# Patient Record
Sex: Female | Born: 1955 | Race: Black or African American | Hispanic: No | Marital: Single | Smoking: Never smoker
Health system: Southern US, Community
[De-identification: ages and names within clinical notes are randomized; demographics above are authoritative.]

## PROBLEM LIST (undated history)

## (undated) DIAGNOSIS — D649 Anemia, unspecified: Secondary | ICD-10-CM

## (undated) DIAGNOSIS — M19049 Primary osteoarthritis, unspecified hand: Secondary | ICD-10-CM

## (undated) DIAGNOSIS — E119 Type 2 diabetes mellitus without complications: Secondary | ICD-10-CM

## (undated) DIAGNOSIS — L570 Actinic keratosis: Secondary | ICD-10-CM

## (undated) DIAGNOSIS — Z5189 Encounter for other specified aftercare: Secondary | ICD-10-CM

## (undated) DIAGNOSIS — E785 Hyperlipidemia, unspecified: Secondary | ICD-10-CM

## (undated) DIAGNOSIS — I10 Essential (primary) hypertension: Secondary | ICD-10-CM

## (undated) DIAGNOSIS — J45909 Unspecified asthma, uncomplicated: Secondary | ICD-10-CM

## (undated) HISTORY — DX: Encounter for other specified aftercare: Z51.89

## (undated) HISTORY — DX: Unspecified asthma, uncomplicated: J45.909

## (undated) HISTORY — DX: Primary osteoarthritis, unspecified hand: M19.049

## (undated) HISTORY — DX: Anemia, unspecified: D64.9

## (undated) HISTORY — DX: Actinic keratosis: L57.0

## (undated) HISTORY — DX: Hyperlipidemia, unspecified: E78.5

---

## 1998-07-02 ENCOUNTER — Emergency Department (HOSPITAL_COMMUNITY): Admission: EM | Admit: 1998-07-02 | Discharge: 1998-07-02 | Payer: Self-pay | Admitting: Emergency Medicine

## 1998-09-26 ENCOUNTER — Encounter: Payer: Self-pay | Admitting: Emergency Medicine

## 1998-09-26 ENCOUNTER — Emergency Department (HOSPITAL_COMMUNITY): Admission: EM | Admit: 1998-09-26 | Discharge: 1998-09-26 | Payer: Self-pay | Admitting: Emergency Medicine

## 1999-06-14 ENCOUNTER — Emergency Department (HOSPITAL_COMMUNITY): Admission: EM | Admit: 1999-06-14 | Discharge: 1999-06-14 | Payer: Self-pay | Admitting: Emergency Medicine

## 1999-09-29 ENCOUNTER — Other Ambulatory Visit: Admission: RE | Admit: 1999-09-29 | Discharge: 1999-09-29 | Payer: Self-pay | Admitting: Family Medicine

## 2000-01-24 ENCOUNTER — Encounter (INDEPENDENT_AMBULATORY_CARE_PROVIDER_SITE_OTHER): Payer: Self-pay | Admitting: Specialist

## 2000-01-24 ENCOUNTER — Other Ambulatory Visit: Admission: RE | Admit: 2000-01-24 | Discharge: 2000-01-24 | Payer: Self-pay | Admitting: Obstetrics and Gynecology

## 2000-09-06 ENCOUNTER — Encounter: Payer: Self-pay | Admitting: Family Medicine

## 2000-09-06 ENCOUNTER — Encounter: Admission: RE | Admit: 2000-09-06 | Discharge: 2000-09-06 | Payer: Self-pay | Admitting: Family Medicine

## 2000-09-18 ENCOUNTER — Other Ambulatory Visit: Admission: RE | Admit: 2000-09-18 | Discharge: 2000-09-18 | Payer: Self-pay | Admitting: Family Medicine

## 2001-09-20 ENCOUNTER — Emergency Department (HOSPITAL_COMMUNITY): Admission: EM | Admit: 2001-09-20 | Discharge: 2001-09-20 | Payer: Self-pay

## 2001-12-11 ENCOUNTER — Other Ambulatory Visit: Admission: RE | Admit: 2001-12-11 | Discharge: 2001-12-11 | Payer: Self-pay | Admitting: Family Medicine

## 2003-04-21 ENCOUNTER — Other Ambulatory Visit: Admission: RE | Admit: 2003-04-21 | Discharge: 2003-04-21 | Payer: Self-pay | Admitting: Family Medicine

## 2003-07-31 ENCOUNTER — Encounter: Admission: RE | Admit: 2003-07-31 | Discharge: 2003-07-31 | Payer: Self-pay | Admitting: Family Medicine

## 2003-07-31 IMAGING — US US PELVIS COMPLETE MODIFY
1 series · 14 of 25 positions shown · non-contrast
Comparison: none

CLINICAL DATA: Dysmenorrhea.  
 TRANSABDOMINAL AND TRANSVAGINAL PELVIC ULTRASOUND
 The uterus measures 10.4 x 6.5 x 7.9 cm in size and has a somewhat heterogeneous ultrasound texture.  There is a 1.6 x 1.0 x 1.6 cm in size fibroid seen within the posterior body of the uterus and a .6 x .5 x .7 cm in size fibroid seen within the right fundal portion of the uterus.  The endometrial stripe thickness measures 16 mm.  The ovaries are normal in size and contour, and there is no evidence for a pathologic cyst or mass.  There is a small amount of free pelvic fluid. 
 IMPRESSION
 1.  Small (1.6 cm) posterior body uterine fibroid and 7 mm in size right fundal uterine fibroid.  
 2.  The endometrial stripe thickness is 16 mm.  
 3.  Normal adnexal regions.

[Series 1: unknown · 0.27mm/px · 14 of 50 slices shown]
[im 1/50]
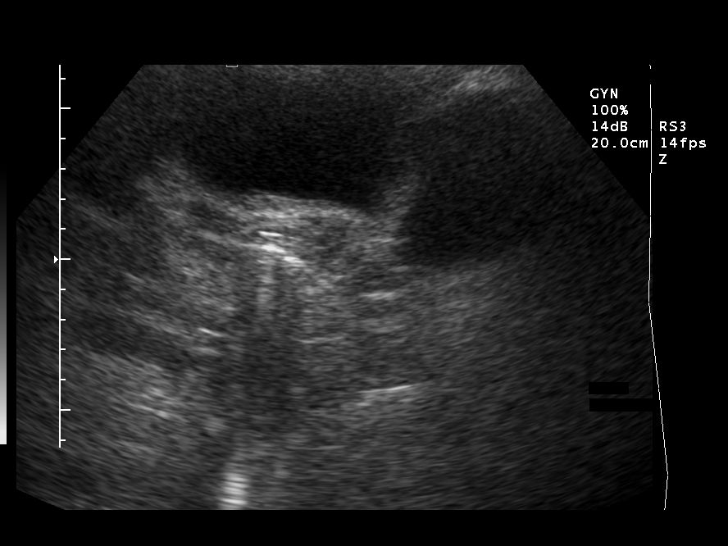
[im 5/50]
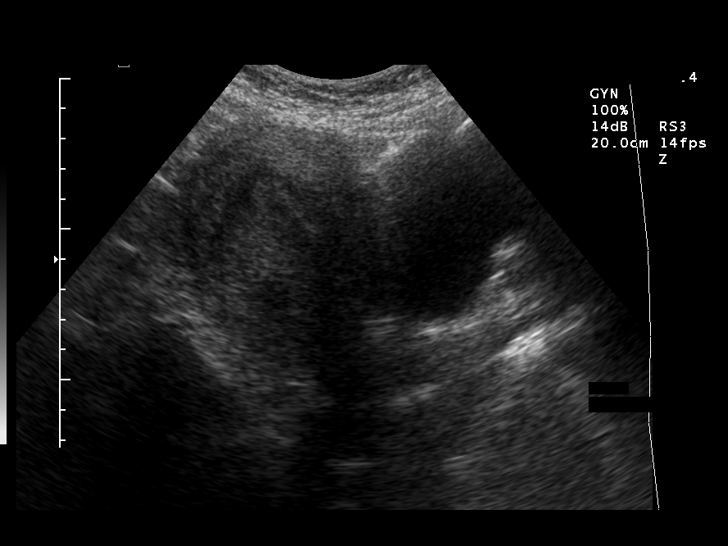
[im 9/50]
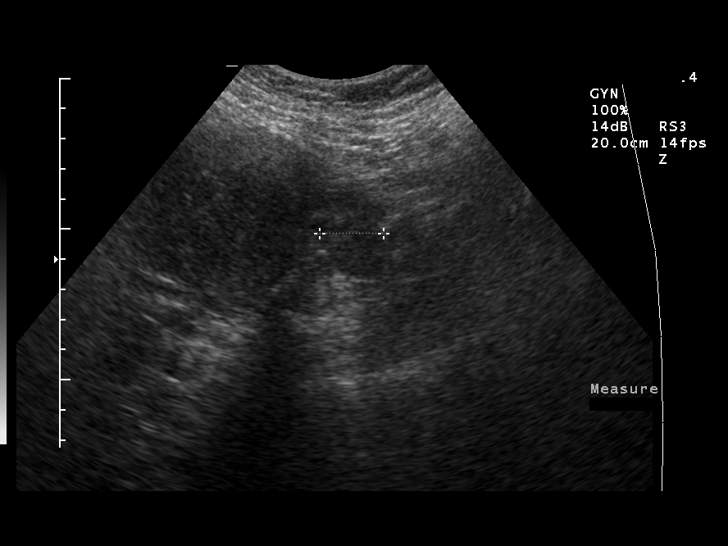
[im 13/50]
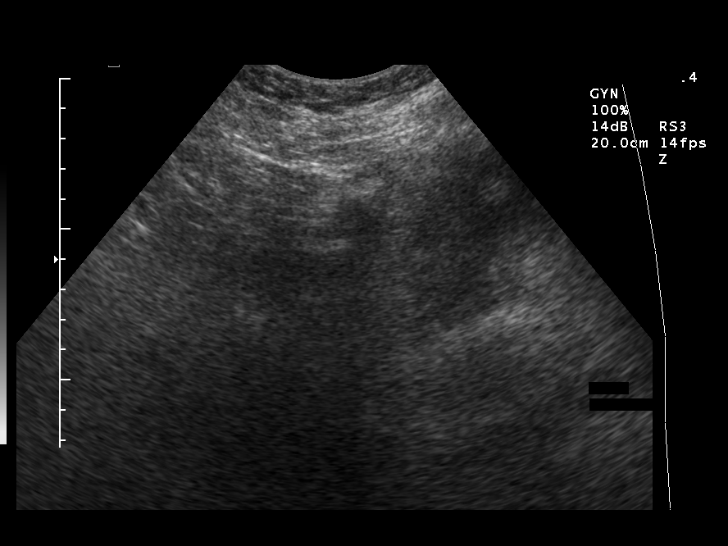
[im 17/50]
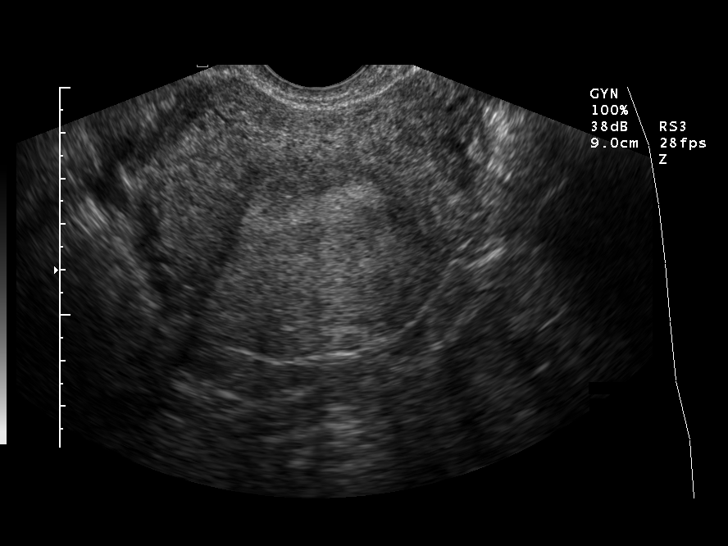
[im 19/50]
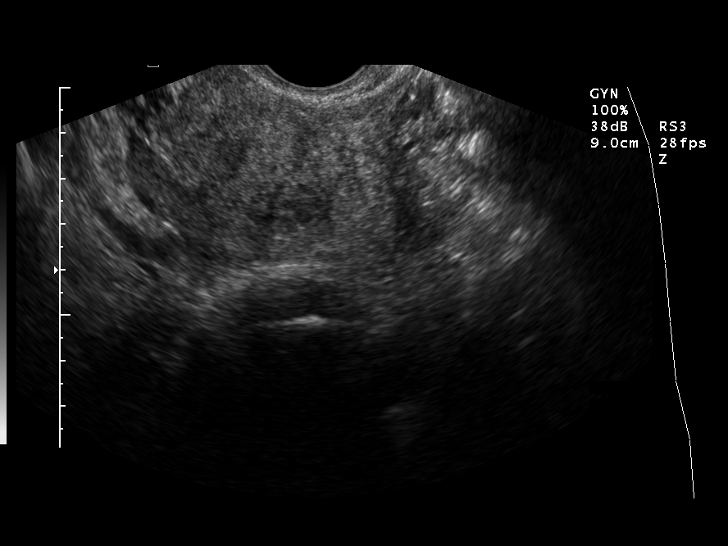
[im 23/50]
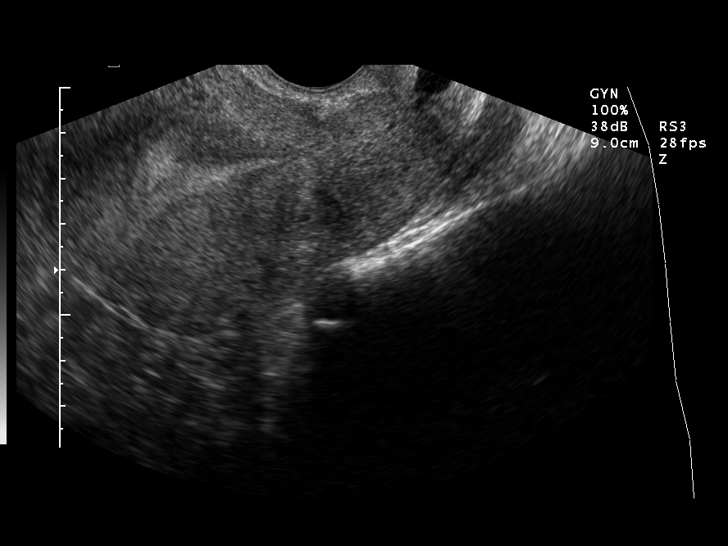
[im 27/50]
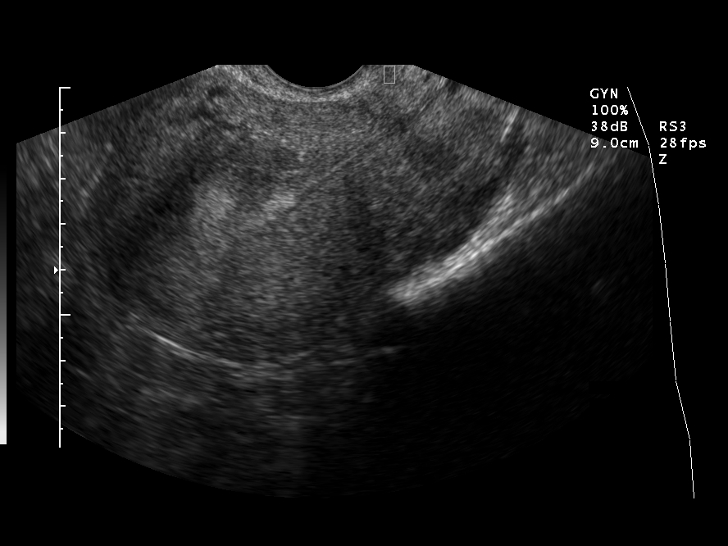
[im 31/50]
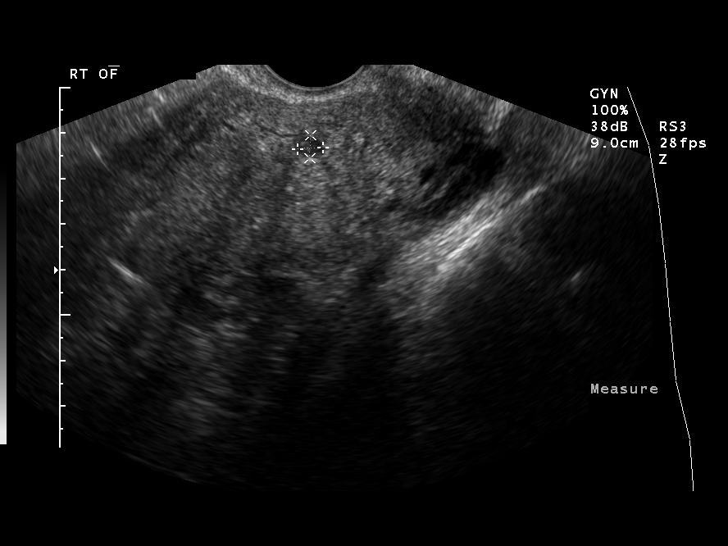
[im 33/50]
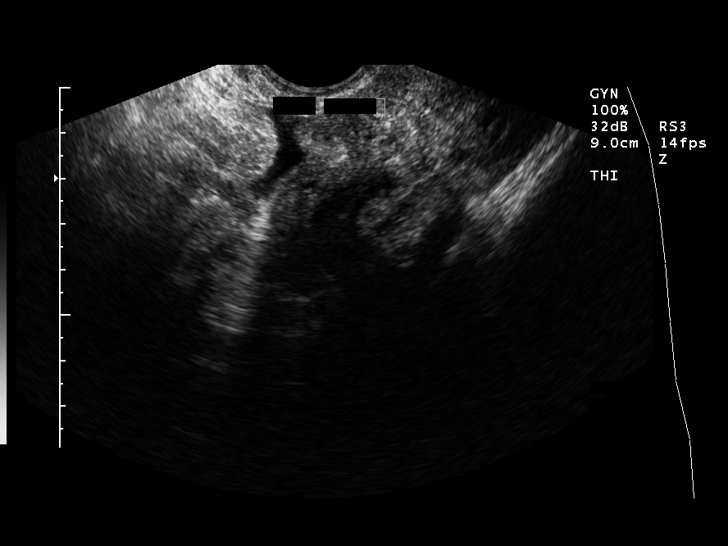
[im 37/50]
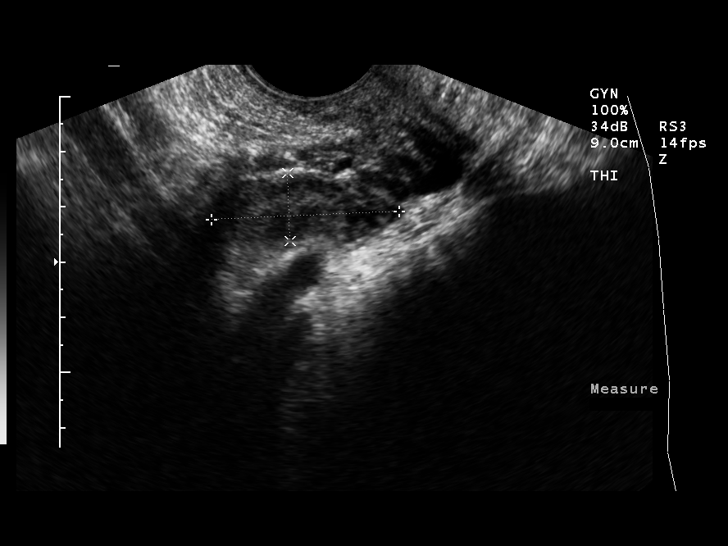
[im 41/50]
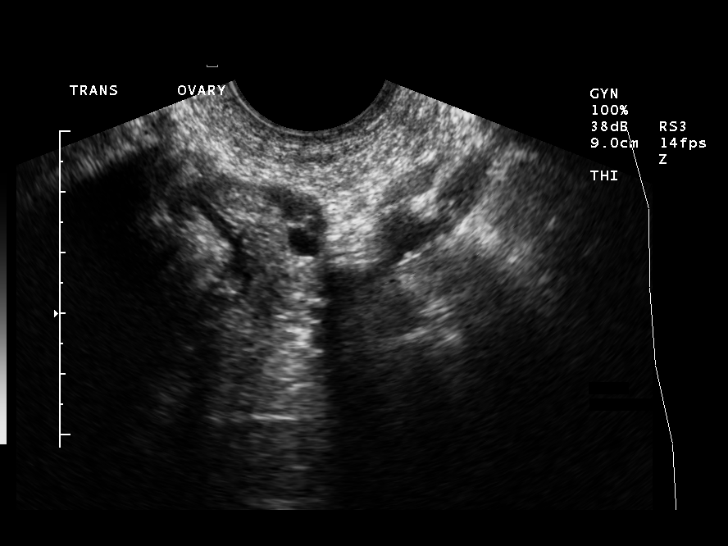
[im 45/50]
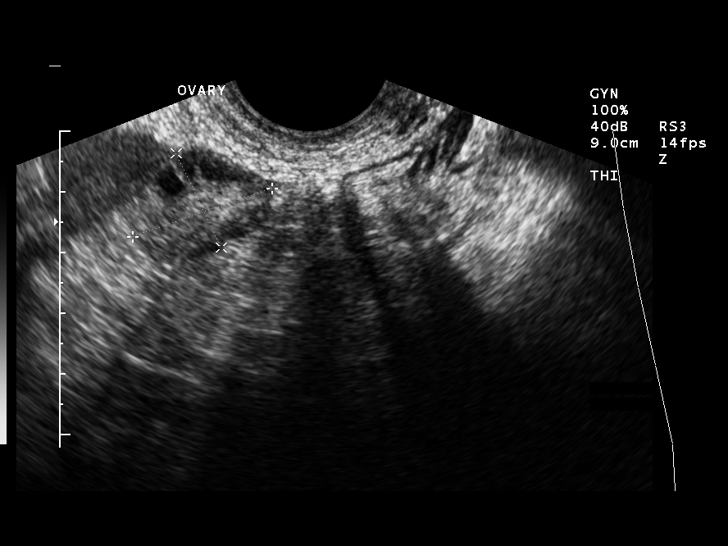
[im 50/50]
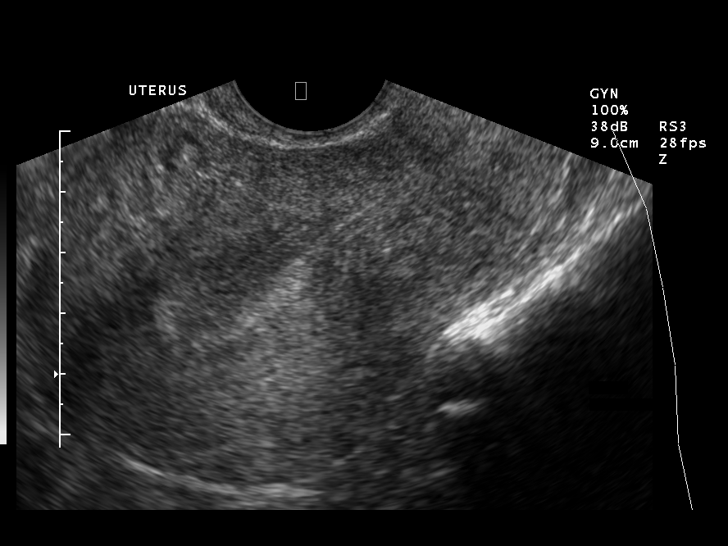

[14 of 25 positions shown; findings below may reference images not displayed]

## 2003-11-12 ENCOUNTER — Encounter: Admission: RE | Admit: 2003-11-12 | Discharge: 2003-11-12 | Payer: Self-pay | Admitting: Family Medicine

## 2005-02-01 ENCOUNTER — Emergency Department (HOSPITAL_COMMUNITY): Admission: EM | Admit: 2005-02-01 | Discharge: 2005-02-01 | Payer: Self-pay | Admitting: Emergency Medicine

## 2005-11-28 ENCOUNTER — Ambulatory Visit: Payer: Self-pay | Admitting: Family Medicine

## 2006-02-13 ENCOUNTER — Encounter: Payer: Self-pay | Admitting: Family Medicine

## 2006-02-13 ENCOUNTER — Other Ambulatory Visit: Admission: RE | Admit: 2006-02-13 | Discharge: 2006-02-13 | Payer: Self-pay | Admitting: Family Medicine

## 2006-02-13 ENCOUNTER — Ambulatory Visit: Payer: Self-pay | Admitting: Family Medicine

## 2008-06-06 ENCOUNTER — Other Ambulatory Visit: Admission: RE | Admit: 2008-06-06 | Discharge: 2008-06-06 | Payer: Self-pay | Admitting: Family Medicine

## 2008-06-06 ENCOUNTER — Ambulatory Visit: Payer: Self-pay | Admitting: Family Medicine

## 2008-06-06 ENCOUNTER — Encounter: Payer: Self-pay | Admitting: Family Medicine

## 2008-06-06 DIAGNOSIS — M19049 Primary osteoarthritis, unspecified hand: Secondary | ICD-10-CM

## 2008-06-06 DIAGNOSIS — D259 Leiomyoma of uterus, unspecified: Secondary | ICD-10-CM | POA: Insufficient documentation

## 2008-06-06 HISTORY — DX: Primary osteoarthritis, unspecified hand: M19.049

## 2008-06-06 LAB — CONVERTED CEMR LAB
Bilirubin Urine: NEGATIVE
Glucose, Urine, Semiquant: NEGATIVE
Nitrite: NEGATIVE
Protein, U semiquant: NEGATIVE
WBC Urine, dipstick: NEGATIVE

## 2008-07-04 ENCOUNTER — Ambulatory Visit: Payer: Self-pay | Admitting: Family Medicine

## 2008-08-01 ENCOUNTER — Ambulatory Visit (HOSPITAL_COMMUNITY): Admission: RE | Admit: 2008-08-01 | Discharge: 2008-08-01 | Payer: Self-pay | Admitting: Family Medicine

## 2008-12-03 ENCOUNTER — Ambulatory Visit: Payer: Self-pay | Admitting: Internal Medicine

## 2008-12-03 ENCOUNTER — Encounter (INDEPENDENT_AMBULATORY_CARE_PROVIDER_SITE_OTHER): Payer: Self-pay | Admitting: *Deleted

## 2008-12-03 DIAGNOSIS — I10 Essential (primary) hypertension: Secondary | ICD-10-CM

## 2008-12-30 ENCOUNTER — Encounter (INDEPENDENT_AMBULATORY_CARE_PROVIDER_SITE_OTHER): Payer: Self-pay | Admitting: Internal Medicine

## 2008-12-30 ENCOUNTER — Ambulatory Visit: Payer: Self-pay | Admitting: Internal Medicine

## 2011-08-09 ENCOUNTER — Other Ambulatory Visit: Payer: Self-pay | Admitting: Family Medicine

## 2011-08-09 DIAGNOSIS — Z1231 Encounter for screening mammogram for malignant neoplasm of breast: Secondary | ICD-10-CM

## 2011-08-29 ENCOUNTER — Encounter: Payer: Self-pay | Admitting: Family Medicine

## 2011-08-29 ENCOUNTER — Ambulatory Visit (INDEPENDENT_AMBULATORY_CARE_PROVIDER_SITE_OTHER): Payer: Self-pay | Admitting: Family Medicine

## 2011-08-29 ENCOUNTER — Other Ambulatory Visit (HOSPITAL_COMMUNITY)
Admission: RE | Admit: 2011-08-29 | Discharge: 2011-08-29 | Disposition: A | Discharge status: Home / Self Care | Payer: Self-pay | Source: Ambulatory Visit | Attending: Family Medicine | Admitting: Family Medicine

## 2011-08-29 DIAGNOSIS — Z01419 Encounter for gynecological examination (general) (routine) without abnormal findings: Secondary | ICD-10-CM | POA: Insufficient documentation

## 2011-08-29 DIAGNOSIS — M19049 Primary osteoarthritis, unspecified hand: Secondary | ICD-10-CM

## 2011-08-29 DIAGNOSIS — Z0000 Encounter for general adult medical examination without abnormal findings: Secondary | ICD-10-CM

## 2011-08-29 DIAGNOSIS — Z23 Encounter for immunization: Secondary | ICD-10-CM

## 2011-08-29 DIAGNOSIS — N920 Excessive and frequent menstruation with regular cycle: Secondary | ICD-10-CM

## 2011-08-29 DIAGNOSIS — D259 Leiomyoma of uterus, unspecified: Secondary | ICD-10-CM

## 2011-08-29 DIAGNOSIS — I10 Essential (primary) hypertension: Secondary | ICD-10-CM

## 2011-08-29 NOTE — Progress Notes (Signed)
  Subjective:    Patient ID: Hayley Fox, female    DOB: 09-21-1955, 56 y.o.   MRN: [id number]  HPI Lillybeth is a 56 year old female G6 P4 ,,,,,,,, still having regular monthly periods however she is experiencing hot flashes recently,,,,,,,, who comes in as a new patient for general physical examination  Other than for children she's always been in good health. She has noticed some soreness and stiffness in the joints of her hands and knees no redness or swelling. She gets routine eye care, dental care, tetanus booster unknown, never had a colonoscopy  Mammogram 3 years ago she does do BSE monthly. History of fibrocystic breast changes  Family history father is in his 57s with hypertension and diabetes mom died in her 55s complications of alcoholism one brother is an alcoholic no sisters.  Social history she is a Agricultural engineer by trade single    Review of Systems  Constitutional: Negative.   HENT: Negative.   Eyes: Negative.   Respiratory: Negative.   Cardiovascular: Negative.   Gastrointestinal: Negative.   Genitourinary: Negative.   Musculoskeletal: Positive for arthralgias.  Neurological: Negative.   Hematological: Negative.   Psychiatric/Behavioral: Negative.        Objective:   Physical Exam  Constitutional: She appears well-developed and well-nourished.  HENT:  Head: Normocephalic and atraumatic.  Right Ear: External ear normal.  Left Ear: External ear normal.  Nose: Nose normal.  Mouth/Throat: Oropharynx is clear and moist.  Eyes: EOM are normal. Pupils are equal, round, and reactive to light.  Neck: Normal range of motion. Neck supple. No thyromegaly present.  Cardiovascular: Normal rate, regular rhythm, normal heart sounds and intact distal pulses.  Exam reveals no gallop and no friction rub.   No murmur heard. Pulmonary/Chest: Effort normal and breath sounds normal.  Abdominal: Soft. Bowel sounds are normal. She exhibits no distension and no mass. There is no  tenderness. There is no rebound.  Genitourinary: Vagina normal and uterus normal. Guaiac negative stool. No vaginal discharge found.       Bilateral breast exam normal except for marble size soft rubbery movable cystic lesion left breast 3:00 1 inch from the nipple  Musculoskeletal: Normal range of motion.  Lymphadenopathy:    She has no cervical adenopathy.  Neurological: She is alert. She has normal reflexes. No cranial nerve deficit. She exhibits normal muscle tone. Coordination normal.  Skin: Skin is warm and dry.  Psychiatric: She has a normal mood and affect. Her behavior is normal. Judgment and thought content normal.          Assessment & Plan:  Healthy female  Lesion left breast 3:00 one at in the nipple soft rubbery movable probable cystic lesion mammogram next week for confirmation  Set up for a screening colonoscopy

## 2011-08-29 NOTE — Patient Instructions (Signed)
Continue your good health habits  Return in one year for general physical examination sooner if any problems 

## 2011-08-30 ENCOUNTER — Encounter: Payer: Self-pay | Admitting: Internal Medicine

## 2011-09-05 ENCOUNTER — Ambulatory Visit (HOSPITAL_COMMUNITY)
Admission: RE | Admit: 2011-09-05 | Discharge: 2011-09-05 | Disposition: A | Discharge status: Home / Self Care | Payer: Self-pay | Source: Ambulatory Visit | Attending: Family Medicine | Admitting: Family Medicine

## 2011-09-05 DIAGNOSIS — Z1231 Encounter for screening mammogram for malignant neoplasm of breast: Secondary | ICD-10-CM

## 2011-09-07 ENCOUNTER — Other Ambulatory Visit: Payer: Self-pay | Admitting: Family Medicine

## 2011-09-07 DIAGNOSIS — R928 Other abnormal and inconclusive findings on diagnostic imaging of breast: Secondary | ICD-10-CM

## 2011-09-27 ENCOUNTER — Other Ambulatory Visit: Payer: Self-pay | Admitting: Obstetrics and Gynecology

## 2011-09-27 ENCOUNTER — Ambulatory Visit (INDEPENDENT_AMBULATORY_CARE_PROVIDER_SITE_OTHER): Payer: Self-pay | Admitting: *Deleted

## 2011-09-27 VITALS — BP 157/99 | HR 81 | Temp 99.2°F | Ht 64.0 in | Wt 169.3 lb

## 2011-09-27 DIAGNOSIS — R928 Other abnormal and inconclusive findings on diagnostic imaging of breast: Secondary | ICD-10-CM

## 2011-09-27 DIAGNOSIS — N63 Unspecified lump in unspecified breast: Secondary | ICD-10-CM

## 2011-09-27 DIAGNOSIS — N6320 Unspecified lump in the left breast, unspecified quadrant: Secondary | ICD-10-CM

## 2011-09-27 DIAGNOSIS — Z1239 Encounter for other screening for malignant neoplasm of breast: Secondary | ICD-10-CM

## 2011-09-27 NOTE — Patient Instructions (Signed)
Taught patient how to perform BSE and gave educational materials to take home. Patient did not need a Pap smear today due to last Pap smear was 08/29/11.  Let her know BCCCP will cover Pap smears every 3 years unless has a history of abnormal Pap smears. Patient is scheduled for a left breast diagnostic mammogram and possible left breast ultrasound Friday, September 30, 2011 at 0950. Patient aware of appointment and will be there. Let patient know will follow up with her within the next couple weeks with results. Patients BP was elevated at 157/97. Talked with patient about her BP. Gave patient a sheet to use as a log to keep up with her BP. Encouraged her to have her BP taken several times and keep record. Talked with patient about increasing physical activity and diet. Encouraged patient to share the BP log with her PCP. Patient verbalized understanding.

## 2011-09-27 NOTE — Progress Notes (Signed)
Complaints of lump in left breast. Referred from the [facility] for additional imaging of left breast. Screening mammogram was 09/05/11.  Pap Smear:    Pap smear not performed today. Patients last Pap smear was 08/29/11 at [facility] and was normal. Per patient she has no history of abnormal Pap smears. Pap smear result above is in EPIC.  Physical exam: Breasts Left breast larger than right breast. No skin abnormalities bilateral breasts. No nipple retraction left breast. Right nipple inverted per patient is normal. No nipple discharge bilateral breasts. No lymphadenopathy. No lumps palpated right breast. Palpated lump in the left breast at 3 o'clock around 4 cm from the areola. No complaints of pain or tenderness on exam. Patient referred to the [facility] for left breast diagnostic mammogram and possible left breast ultrasound. Appointment scheduled for Friday, September 30, 2011 at 0950.          Pelvic/Bimanual No Pap smear completed today since last Pap smear was 08/29/11 and normal. Pap smear not indicated per BCCCP guidelines.

## 2011-09-30 ENCOUNTER — Other Ambulatory Visit: Payer: Self-pay

## 2011-10-03 ENCOUNTER — Ambulatory Visit
Admission: RE | Admit: 2011-10-03 | Discharge: 2011-10-03 | Disposition: A | Discharge status: Home / Self Care | Payer: No Typology Code available for payment source | Source: Ambulatory Visit | Attending: Obstetrics and Gynecology | Admitting: Obstetrics and Gynecology

## 2011-10-03 DIAGNOSIS — R928 Other abnormal and inconclusive findings on diagnostic imaging of breast: Secondary | ICD-10-CM

## 2011-10-04 ENCOUNTER — Telehealth: Payer: Self-pay

## 2011-10-04 NOTE — Telephone Encounter (Signed)
Called pt and was unable to leave message at both contact #'s [phone] and [phone] due to numbers being disconnected.  Will notify Wynona Canes to send certified letter.

## 2011-10-10 ENCOUNTER — Encounter: Payer: Self-pay | Admitting: Obstetrics and Gynecology

## 2011-10-10 ENCOUNTER — Encounter: Payer: Self-pay | Admitting: Internal Medicine

## 2011-11-08 ENCOUNTER — Telehealth: Payer: Self-pay

## 2011-11-08 NOTE — Telephone Encounter (Signed)
Patient is aware and will try the Motrin

## 2011-11-08 NOTE — Telephone Encounter (Signed)
Hayley Fox please call,,,,,,,,,,,,, date OTC remedy would be to take about 600 mg of Motrin twice daily with food,,,,,,,, or if that does not help or she thinks it may be something else we will need to see her in the office for evaluation

## 2011-11-08 NOTE — Telephone Encounter (Signed)
Triage VM:  Pt called and states she is having problems with arthritis and stiffness in her joints.  Pt states it is difficult to bathe herself.  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  Pt states she is experiencing pain along with the stiffness and would like to know if an rx can be called in to her pharmacy.  Pt states she is still paying on her bill and cannot come into the office for another visit.  Pls advise.

## 2012-03-15 ENCOUNTER — Ambulatory Visit (INDEPENDENT_AMBULATORY_CARE_PROVIDER_SITE_OTHER): Payer: Self-pay | Admitting: Family Medicine

## 2012-03-15 ENCOUNTER — Encounter: Payer: Self-pay | Admitting: Family Medicine

## 2012-03-15 DIAGNOSIS — L570 Actinic keratosis: Secondary | ICD-10-CM

## 2012-03-15 DIAGNOSIS — M1990 Unspecified osteoarthritis, unspecified site: Secondary | ICD-10-CM

## 2012-03-15 HISTORY — DX: Actinic keratosis: L57.0

## 2012-03-15 MED ORDER — INDOMETHACIN ER 75 MG PO CPCR: ORAL_CAPSULE | ORAL | Status: DC

## 2012-03-15 NOTE — Progress Notes (Signed)
  Subjective:    Patient ID: Hayley Fox, female    DOB: 05/07/1956, 56 y.o.   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 The base was cauterized Band-Aids was applied the lesion was sent for pathologic analysis. She tolerated the procedure no complications  She's also complaining at of multiple arthritic changes stiffness soreness unresponsive to [facility] Motrin 800 mg twice daily   Review of Systems General and dermatologic and neurologic review of systems otherwise negative    Objective:   Physical Exam Procedure see above       Assessment & Plan:

## 2012-03-15 NOTE — Patient Instructions (Signed)
Within 2 weeks we will call you the report on the lesion we removed it  Indomethacin one daily with food for your arthritis

## 2012-06-07 ENCOUNTER — Telehealth: Payer: Self-pay | Admitting: Family Medicine

## 2012-06-07 NOTE — Telephone Encounter (Signed)
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  She states she has seen Dr.  Tawanna Cooler for Bilateral Arm pain .  She was diagnosed with OsteoArthritis.  She reports the pain is worse and making work difficulty.  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  Difficulty with bathing and dressing her self.  Certain ADL's are aggravating pain . She reports Dr. Tawanna Cooler started her on Indocin and nothing agrees with her stomach , not even Motrin.  Treatment- Epsom salts, decreased beef products, hand arm exercise to strength but very painful. She reports certain activities worsen the discomfort.  She is wanting to get signed up for disability.  What does she need to do to start this process?  She does not want to get fired from her current position. PLEASE CONTACT CONCERNING DISABILITY

## 2012-06-07 NOTE — Telephone Encounter (Signed)
Hayley Fox please call. She needs to see an orthopedist for evaluation recommend Dr. Norlene Campbell and group

## 2012-06-21 NOTE — Telephone Encounter (Signed)
Patient calling back.  States that she has not heard anything about her request for disability issues.  Still unable to work more then 4 hours 2 days a week with the pain that she is having.  Per the note below, I gave her Dr. Nelida Meuse recommendation to see Dr. Cleophas Dunker.  I gave her the office location on [address] with Delbert Harness and the office phone number.  She is going to call for an appt.

## 2012-06-21 NOTE — Telephone Encounter (Signed)
noted 

## 2013-04-17 ENCOUNTER — Telehealth: Payer: Self-pay | Admitting: Family Medicine

## 2013-04-17 NOTE — Telephone Encounter (Signed)
Please call patient and offer an appointment tomorrow

## 2013-04-17 NOTE — Telephone Encounter (Signed)
Pt has had ongoing productive cough for a month, started w/ a bad cold. Pt been taking OTC meds.not helping.  Pt concerned it may be headed toward pneumonia. Pt would like appt asap. pls advise. None available.

## 2013-04-18 ENCOUNTER — Encounter: Payer: Self-pay | Admitting: Family Medicine

## 2013-04-18 ENCOUNTER — Ambulatory Visit (INDEPENDENT_AMBULATORY_CARE_PROVIDER_SITE_OTHER): Payer: Self-pay | Admitting: Family Medicine

## 2013-04-18 VITALS — BP 180/120 | HR 99 | Temp 98.2°F | Wt 188.0 lb

## 2013-04-18 DIAGNOSIS — J309 Allergic rhinitis, unspecified: Secondary | ICD-10-CM | POA: Insufficient documentation

## 2013-04-18 DIAGNOSIS — I10 Essential (primary) hypertension: Secondary | ICD-10-CM

## 2013-04-18 LAB — POCT URINALYSIS DIPSTICK
Blood, UA: NEGATIVE
Leukocytes, UA: NEGATIVE
Protein, UA: NEGATIVE
Urobilinogen, UA: 0.2
pH, UA: 5

## 2013-04-18 LAB — CBC WITH DIFFERENTIAL/PLATELET
Eosinophils Relative: 2.2 % (ref 0.0–5.0)
Lymphocytes Relative: 34.4 % (ref 12.0–46.0)
Lymphs Abs: 2.5 10*3/uL (ref 0.7–4.0)
Monocytes Relative: 7.3 % (ref 3.0–12.0)
Neutro Abs: 4 10*3/uL (ref 1.4–7.7)
RBC: 4.73 Mil/uL (ref 3.87–5.11)
RDW: 15.8 % — ABNORMAL HIGH (ref 11.5–14.6)
WBC: 7.3 10*3/uL (ref 4.5–10.5)

## 2013-04-18 LAB — BASIC METABOLIC PANEL
BUN: 14 mg/dL (ref 6–23)
Calcium: 9.8 mg/dL (ref 8.4–10.5)
Creatinine, Ser: 0.6 mg/dL (ref 0.4–1.2)
GFR: 134.89 mL/min (ref >60.00)
Glucose, Bld: 77 mg/dL (ref 70–99)
Potassium: 3.9 mEq/L (ref 3.5–5.1)

## 2013-04-18 LAB — TSH: TSH: 2.27 u[IU]/mL (ref 0.35–5.50)

## 2013-04-18 MED ORDER — PREDNISONE 20 MG PO TABS: ORAL_TABLET | ORAL | Status: DC

## 2013-04-18 NOTE — Progress Notes (Signed)
  Subjective:    Patient ID: Hayley Fox, female    DOB: April 05, 1956, 57 y.o.   MRN: [id number]  HPI Hayley Fox is a 57 year old female........ cash paying patient,,,,,, who comes in today for evaluation of 3 problems  She states for the past month she's had head congestion postnasal drip and cough. She does have a cat but she's had it for over 5 years. She has a caregiver and works in peoples homes. She had childhood asthma but nothing as an adult.  She's had a history of hypertension which is controlled in the past with diet and exercise. BP a year ago was 140/80. BP today is 180/120 right arm sitting position repeat by me same  She's been exercising and has some damage to her toenails  Family history pertinent for hypertension Review of Systems Review of systems negative    Objective:   Physical Exam Well-developed well nourished female no acute distress HEENT negative except for 3+ nasal edema neck was supple no adenopathy thyroid normal lungs are clear except for some very mild late expiratory wheezing. Cardiac exam negative       Assessment & Plan:  Allergic rhinitis plan prednisone burst and taper  New onset hypertension,,,,,,,, because of her marked elevation of blood pressure 180/120 she was given 0.2 of clonidine and her BP was monitored every 15 minutes

## 2013-04-18 NOTE — Patient Instructions (Signed)
Take the prednisone as directed  Benicar,,,,,,,,,,,, one tablet now,,,,,,,, then one tablet every morning  Return in one week for followup  Complete salt free diet  Walk 30 minutes daily  The most common side effects from the medication is hives and/or cough. If you developed hives stopped the medicine cost immediately

## 2013-04-23 ENCOUNTER — Encounter: Payer: Self-pay | Admitting: Family Medicine

## 2013-04-23 ENCOUNTER — Ambulatory Visit (INDEPENDENT_AMBULATORY_CARE_PROVIDER_SITE_OTHER): Payer: Self-pay | Admitting: Family Medicine

## 2013-04-23 VITALS — BP 200/120 | Temp 98.2°F | Wt 188.0 lb

## 2013-04-23 DIAGNOSIS — I10 Essential (primary) hypertension: Secondary | ICD-10-CM

## 2013-04-23 NOTE — Progress Notes (Signed)
  Subjective:    Patient ID: Hayley Fox, female    DOB: [birth year], 57 y.o.   MRN: [id number]  HPI  Hayley Fox is a 57 year old female nonsmoker who comes in today for followup of hypertension  We saw her the other day and her blood pressure was markedly elevated. Systolic was 180-200. She was asymptomatic. We kept her here for 50 minutes and gave her clonidine and Benicar. She's been taking the Benicar daily however BP today is still high at 200/120. She was given another 10 mg of a beta blocker and 0.2 mg of clonidine stat in her BP was checked q. 15 minutes.  Review of Systems    negative Objective:   Physical Exam  Well-developed well-nourished female no acute distress vital signs stable she's afebrile BP right arm sitting position 200/120 pulse 70 and regular      Assessment & Plan:  Hypertension not at goal discussed complete bed rest and medication........ however she states she's working for 2 weeks straight and cannot take any time off.  Continue the Benicar 1 daily add the beta blocker 10 mg daily followup in one week

## 2013-04-23 NOTE — Telephone Encounter (Signed)
done

## 2013-04-23 NOTE — Patient Instructions (Addendum)
Benicar,,,,,,,,,,,, one tablet twice daily  bystolic,,, 10 mg ,,,,,,,,,, take one pill twice daily  Check your blood pressure daily in the morning  Return in one week for followup  Complete bedrest today  A no salt

## 2013-05-03 ENCOUNTER — Telehealth: Payer: Self-pay | Admitting: Family Medicine

## 2013-05-03 NOTE — Telephone Encounter (Signed)
Samples available for pickup  

## 2013-05-03 NOTE — Telephone Encounter (Signed)
Pt was given samples of benicar hct/40 mg  Pt bystlic 10mg   And pt appt is not until tues.  If he is going to keep her on this, she will needs refill. Also, pt has no insurance. The benicar is name brand and expensive. Is there anything else.

## 2013-05-07 ENCOUNTER — Encounter: Payer: Self-pay | Admitting: Family Medicine

## 2013-05-07 ENCOUNTER — Ambulatory Visit (INDEPENDENT_AMBULATORY_CARE_PROVIDER_SITE_OTHER): Payer: Self-pay | Admitting: Family Medicine

## 2013-05-07 VITALS — BP 150/90 | Temp 98.3°F | Wt 188.0 lb

## 2013-05-07 DIAGNOSIS — I10 Essential (primary) hypertension: Secondary | ICD-10-CM

## 2013-05-07 MED ORDER — ATENOLOL 50 MG PO TABS: 50.0000 mg | ORAL_TABLET | Freq: Every day | ORAL | Status: DC

## 2013-05-07 MED ORDER — AMLODIPINE BESYLATE 5 MG PO TABS: 5.0000 mg | ORAL_TABLET | Freq: Every day | ORAL | Status: DC

## 2013-05-07 MED ORDER — HYDROCHLOROTHIAZIDE 12.5 MG PO TABS: 12.5000 mg | ORAL_TABLET | Freq: Every day | ORAL | Status: DC

## 2013-05-07 NOTE — Progress Notes (Signed)
  Subjective:    Patient ID: Hayley Fox, female    DOB: [birth year], 57 y.o.   MRN: [id number]  HPI  Hayley Fox is a 57 year old female nonsmoker who comes in today for hypertension followup  She has no insurance  We have given her samples of a beta blocker 10 mg daily and Benicar 50 mg daily BP is down to 150/90.  Review of Systems    review of systems otherwise negative Objective:   Physical Exam  Well-developed well-nourished female no acute distress BP right arm sitting position 150/90      Assessment & Plan:  Hypertension not at goal add generic Norvasc and generic beta blocker followup in one month

## 2013-05-07 NOTE — Patient Instructions (Signed)
Norvasc 5 mg, hydrochlorothiazide 12.5 mg, Tenormin 50 mg,,,,,,,,,,,,, one of each daily in the morning  Check your blood pressure daily in the morning  Return in one month for followup

## 2013-06-04 ENCOUNTER — Encounter: Payer: Self-pay | Admitting: Family Medicine

## 2013-06-04 ENCOUNTER — Ambulatory Visit (INDEPENDENT_AMBULATORY_CARE_PROVIDER_SITE_OTHER): Payer: Self-pay | Admitting: Family Medicine

## 2013-06-04 VITALS — BP 140/90 | Temp 99.0°F | Wt 193.0 lb

## 2013-06-04 DIAGNOSIS — I10 Essential (primary) hypertension: Secondary | ICD-10-CM

## 2013-06-04 NOTE — Progress Notes (Signed)
   Subjective:    Patient ID: Hayley Fox, female    DOB: 09-Aug-1955, 57 y.o.   MRN: [id number]  HPI [name] is a 57 year old married female nonsmoker who comes in today for reevaluation of hypertension. She's currently on Norvasc 5 mg, atenolol 50 mg, and hydrochlorothiazide 12.5 mg. BP 140/90. She says her blood pressures at home typically are a little lower. No side effects from the medication   Review of Systems    review of systems negative Objective:   Physical Exam  Well-developed well nourished female no acute distress vital signs stable she's afebrile BP 140/90      Assessment & Plan:  Hypertension at goal continue current therapy followup in 6 months

## 2013-06-04 NOTE — Patient Instructions (Signed)
Check your blood pressure daily in the morning for the next 6 weeks and benefits normal just check it once weekly  Blood pressure goal 140/90 or less  If your blood pressure is normal again check it weekly followup here in 6 months

## 2013-06-26 ENCOUNTER — Telehealth: Payer: Self-pay | Admitting: Family Medicine

## 2013-06-26 NOTE — Telephone Encounter (Signed)
Patient Information:  Caller Name: Edia  Phone: [phone]  Patient: Hayley, Fox  Gender: Female  DOB: 1956-04-14  Age: 58 Years  PCP: Stevie Kern [facility])  Office Follow Up:  Does the office need to follow up with this patient?: Yes  Instructions For The Office: Requesting prescription be called in for antibiotic or antibiotic eye drops or cream. Requesting callback on 01/07 or 01/08. May leave message on machine.   Symptoms  Reason For Call & Symptoms: [name] had onset of watery, itchy eyes on 04/21/2013. Intermittent eyelid swelling. No eye lid redness. States intermittent pinkness to sclera. Periorbital areas pink onset 2 months ago. Intermittent flakiness to eyelids causing severe itching at times.  All symptoms onset April 20, 2013. Cannot wear makeup-. States she " looks bad." Per eye redness without pus protocol has see today  or tomorrow in office due to red eyes present > 7 days. Declined appointment.  States she was seen in office 2 months ago and was ordered Prednisone for allergies. States she has no insurance and is requesting an antibiotic or a prescription be called in to [facility] on [address] in [place]. Explained no oral antibiotics will not be called in without being seen in office.. Requesting callback to [phone] on 06/26/13 or 06/27/13. May leave message  on answering machine.  Reviewed Health History In EMR: Yes  Reviewed Medications In EMR: Yes  Reviewed Allergies In EMR: Yes  Reviewed Surgeries / Procedures: Yes  Date of Onset of Symptoms: 04/21/2013  Treatments Tried: warm cotton balls, eye drops  Treatments Tried Worked: No  Guideline(s) Used:  Eye - Red Without Pus  Disposition Per Guideline:   See Today or Tomorrow in Office  Reason For Disposition Reached:   Red eyes present > 7 days  Advice Given:  N/A  Patient Refused Recommendation:  Patient Requests Prescription  Has no insurance- has to pay out of pocket

## 2013-06-27 NOTE — Telephone Encounter (Signed)
Per Dr Sherren Mocha.... Clear eye OTC and Claritin plain.  Patient is aware.

## 2013-09-26 ENCOUNTER — Ambulatory Visit (INDEPENDENT_AMBULATORY_CARE_PROVIDER_SITE_OTHER): Payer: BC Managed Care – PPO | Admitting: Family Medicine

## 2013-09-26 ENCOUNTER — Encounter: Payer: Self-pay | Admitting: Family Medicine

## 2013-09-26 ENCOUNTER — Ambulatory Visit (INDEPENDENT_AMBULATORY_CARE_PROVIDER_SITE_OTHER)
Admission: RE | Admit: 2013-09-26 | Discharge: 2013-09-26 | Disposition: A | Discharge status: Home / Self Care | Payer: BC Managed Care – PPO | Source: Ambulatory Visit | Attending: Family Medicine | Admitting: Family Medicine

## 2013-09-26 VITALS — BP 120/80 | Temp 98.6°F | Wt 178.0 lb

## 2013-09-26 DIAGNOSIS — M25551 Pain in right hip: Secondary | ICD-10-CM

## 2013-09-26 DIAGNOSIS — M25559 Pain in unspecified hip: Secondary | ICD-10-CM

## 2013-09-26 MED ORDER — TRAMADOL HCL 50 MG PO TABS: ORAL_TABLET | ORAL | Status: DC

## 2013-09-26 NOTE — Progress Notes (Signed)
Pre visit review using our clinic review tool, if applicable. No additional management support is needed unless otherwise documented below in the visit note. 

## 2013-09-26 NOTE — Progress Notes (Signed)
   Subjective:    Patient ID: Hayley Fox, female    DOB: 12/19/1955, 58 y.o.   MRN: [id number]  HPI Hayley Fox is a 58 year old female nonsmoker who comes in with a month's history of pain in her right hip  She noticed about 4 weeks ago she was at work standing and at the sudden onset of severe pain and she points to her right mid thigh as a source of the pain. She says it radiates up and her right hip. She describes the pain as sharp constant the intensity varies from a tubular 10. When she standing for long periods of time it can be intense and she have to go lie down. She sleeps okay pain doesn't keep her awake at night. No fever chills nausea vomiting diarrhea change in bowel or urinary tract habits.  She's intentionally lost weight she is down to 178,,,,,, 20 pounds via diet and exercise   Review of Systems Review of systems otherwise negative no history of trauma    Objective:   Physical Exam  Well-developed and nourished female no acute distress in the supine position there is no palpable tenderness in the right leg. Sensation strength reflexes pulses all within normal limits. Normal range of motion except for decrease external rotation right hip      Assessment & Plan:  Right thigh and hip pain.........Marland Kitchen begin workup

## 2013-09-26 NOTE — Patient Instructions (Signed)
Go to the main office now for x-rays  A call you the report  Indomethacin one daily when necessary for pain............. tramadol 50 mg twice a day when necessary for severe pain

## 2013-09-27 ENCOUNTER — Other Ambulatory Visit: Payer: Self-pay | Admitting: Family Medicine

## 2013-09-27 DIAGNOSIS — M25559 Pain in unspecified hip: Secondary | ICD-10-CM

## 2013-12-03 ENCOUNTER — Ambulatory Visit (INDEPENDENT_AMBULATORY_CARE_PROVIDER_SITE_OTHER): Payer: BC Managed Care – PPO | Admitting: Family Medicine

## 2013-12-03 ENCOUNTER — Encounter: Payer: Self-pay | Admitting: Family Medicine

## 2013-12-03 VITALS — BP 130/80 | Temp 98.5°F | Wt 178.0 lb

## 2013-12-03 DIAGNOSIS — I10 Essential (primary) hypertension: Secondary | ICD-10-CM

## 2013-12-03 DIAGNOSIS — M25559 Pain in unspecified hip: Secondary | ICD-10-CM

## 2013-12-03 DIAGNOSIS — M25551 Pain in right hip: Secondary | ICD-10-CM

## 2013-12-03 NOTE — Progress Notes (Signed)
   Subjective:    Patient ID: Hayley Fox, female    DOB: 08-23-1955, 58 y.o.   MRN: [id number]  HPI Leilanni is a 58 year old female nonsmoker who comes in today for followup of hypertension  We did her physical examination the fourth week in October 2014 and all is well. She continues to take her medication daily however now she states she wants to stop some of her medication because she's taking too much medication  She's having no side effects she does want to stop it  I encouraged her to continue take her medication daily to his her blood pressures at goal 130/80  She went to see the orthopedist about her hip. She has some degenerative disease and they gave her diclofenac which she takes when necessary  Weight 178   Review of Systems    review of systems otherwise negative Objective:   Physical Exam  Well-developed well-nourished female no acute distress vital signs stable she's afebrile BP 130/80  We had a 15 minute discussion about medication side effects etc. etc. etc. I encouraged her to continue take her medication daily      Assessment & Plan:  Hypertension at goal continue current therapy  Obese continue diet exercise and weight loss down to 178

## 2013-12-03 NOTE — Patient Instructions (Signed)
Continue to take the medication daily  Try to walk 30 minutes daily  Avoid sugar  Followup the fourth week in October for your annual exam  Labs one week prior

## 2013-12-03 NOTE — Progress Notes (Signed)
Pre visit review using our clinic review tool, if applicable. No additional management support is needed unless otherwise documented below in the visit note. 

## 2013-12-04 ENCOUNTER — Telehealth: Payer: Self-pay | Admitting: Family Medicine

## 2013-12-04 NOTE — Telephone Encounter (Signed)
Relevant patient education mailed to patient.  

## 2014-02-25 ENCOUNTER — Ambulatory Visit: Payer: BC Managed Care – PPO | Admitting: *Deleted

## 2014-03-05 ENCOUNTER — Ambulatory Visit (INDEPENDENT_AMBULATORY_CARE_PROVIDER_SITE_OTHER): Payer: BC Managed Care – PPO | Admitting: *Deleted

## 2014-03-05 DIAGNOSIS — Z0000 Encounter for general adult medical examination without abnormal findings: Secondary | ICD-10-CM

## 2014-03-07 LAB — TB SKIN TEST: TB Skin Test: NEGATIVE

## 2014-04-09 ENCOUNTER — Other Ambulatory Visit: Payer: BC Managed Care – PPO

## 2014-04-16 ENCOUNTER — Encounter: Payer: BC Managed Care – PPO | Admitting: Family Medicine

## 2014-04-21 ENCOUNTER — Encounter: Payer: Self-pay | Admitting: Family Medicine

## 2014-05-05 ENCOUNTER — Other Ambulatory Visit (INDEPENDENT_AMBULATORY_CARE_PROVIDER_SITE_OTHER): Payer: BC Managed Care – PPO

## 2014-05-05 DIAGNOSIS — M25551 Pain in right hip: Secondary | ICD-10-CM

## 2014-05-05 DIAGNOSIS — I10 Essential (primary) hypertension: Secondary | ICD-10-CM

## 2014-05-05 LAB — CBC WITH DIFFERENTIAL/PLATELET
Basophils Absolute: 0.1 10*3/uL (ref 0.0–0.1)
Basophils Relative: 1.3 % (ref 0.0–3.0)
EOS PCT: 3.1 % (ref 0.0–5.0)
Eosinophils Absolute: 0.2 10*3/uL (ref 0.0–0.7)
HEMATOCRIT: 37.2 % (ref 36.0–46.0)
Hemoglobin: 12.1 g/dL (ref 12.0–15.0)
Lymphocytes Relative: 36.1 % (ref 12.0–46.0)
Lymphs Abs: 2.1 10*3/uL (ref 0.7–4.0)
MCHC: 32.4 g/dL (ref 30.0–36.0)
MCV: 80.2 fl (ref 78.0–100.0)
MONOS PCT: 9.3 % (ref 3.0–12.0)
Monocytes Absolute: 0.5 10*3/uL (ref 0.1–1.0)
Neutro Abs: 2.9 10*3/uL (ref 1.4–7.7)
Neutrophils Relative %: 50.2 % (ref 43.0–77.0)
Platelets: 307 10*3/uL (ref 150.0–400.0)
RBC: 4.64 Mil/uL (ref 3.87–5.11)
RDW: 14.5 % (ref 11.5–15.5)
WBC: 5.8 10*3/uL (ref 4.0–10.5)

## 2014-05-05 LAB — BASIC METABOLIC PANEL
BUN: 15 mg/dL (ref 6–23)
CO2: 25 meq/L (ref 19–32)
CREATININE: 0.7 mg/dL (ref 0.4–1.2)
Calcium: 9.7 mg/dL (ref 8.4–10.5)
Chloride: 105 mEq/L (ref 96–112)
GFR: 110.33 mL/min (ref >60.00)
Glucose, Bld: 165 mg/dL — ABNORMAL HIGH (ref 70–99)
Potassium: 4.6 mEq/L (ref 3.5–5.1)
Sodium: 144 mEq/L (ref 135–145)

## 2014-05-05 LAB — POCT URINALYSIS DIPSTICK
Bilirubin, UA: NEGATIVE
Glucose, UA: NEGATIVE
Ketones, UA: NEGATIVE
Nitrite, UA: NEGATIVE
PH UA: 7
RBC UA: NEGATIVE
SPEC GRAV UA: 1.02
Urobilinogen, UA: 0.2

## 2014-05-05 LAB — HEPATIC FUNCTION PANEL
ALT: 45 U/L — AB (ref 0–35)
AST: 32 U/L (ref 0–37)
Albumin: 4.5 g/dL (ref 3.5–5.2)
Alkaline Phosphatase: 90 U/L (ref 39–117)
BILIRUBIN DIRECT: 0 mg/dL (ref 0.0–0.3)
BILIRUBIN TOTAL: 0.6 mg/dL (ref 0.2–1.2)
TOTAL PROTEIN: 8 g/dL (ref 6.0–8.3)

## 2014-05-05 LAB — LIPID PANEL
Cholesterol: 254 mg/dL — ABNORMAL HIGH (ref 0–200)
HDL: 49 mg/dL (ref >39.00)
LDL Cholesterol: 167 mg/dL — ABNORMAL HIGH (ref 0–99)
NONHDL: 205
Total CHOL/HDL Ratio: 5
Triglycerides: 190 mg/dL — ABNORMAL HIGH (ref 0.0–149.0)
VLDL: 38 mg/dL (ref 0.0–40.0)

## 2014-05-07 LAB — TSH: TSH: 2.77 u[IU]/mL (ref 0.35–4.50)

## 2014-05-19 ENCOUNTER — Encounter: Payer: Self-pay | Admitting: Family Medicine

## 2014-05-19 ENCOUNTER — Other Ambulatory Visit (HOSPITAL_COMMUNITY)
Admission: RE | Admit: 2014-05-19 | Discharge: 2014-05-19 | Disposition: A | Discharge status: Home / Self Care | Payer: BC Managed Care – PPO | Source: Ambulatory Visit | Attending: Family Medicine | Admitting: Family Medicine

## 2014-05-19 ENCOUNTER — Ambulatory Visit (INDEPENDENT_AMBULATORY_CARE_PROVIDER_SITE_OTHER): Payer: BC Managed Care – PPO | Admitting: Family Medicine

## 2014-05-19 ENCOUNTER — Other Ambulatory Visit: Payer: Self-pay | Admitting: Family Medicine

## 2014-05-19 VITALS — BP 148/90 | Temp 99.0°F | Ht 63.75 in | Wt 190.0 lb

## 2014-05-19 DIAGNOSIS — Z1151 Encounter for screening for human papillomavirus (HPV): Secondary | ICD-10-CM | POA: Diagnosis present

## 2014-05-19 DIAGNOSIS — M19249 Secondary osteoarthritis, unspecified hand: Secondary | ICD-10-CM

## 2014-05-19 DIAGNOSIS — Z01419 Encounter for gynecological examination (general) (routine) without abnormal findings: Secondary | ICD-10-CM | POA: Insufficient documentation

## 2014-05-19 DIAGNOSIS — J309 Allergic rhinitis, unspecified: Secondary | ICD-10-CM

## 2014-05-19 DIAGNOSIS — D250 Submucous leiomyoma of uterus: Secondary | ICD-10-CM

## 2014-05-19 DIAGNOSIS — M25551 Pain in right hip: Secondary | ICD-10-CM

## 2014-05-19 DIAGNOSIS — R739 Hyperglycemia, unspecified: Secondary | ICD-10-CM

## 2014-05-19 DIAGNOSIS — I10 Essential (primary) hypertension: Secondary | ICD-10-CM

## 2014-05-19 DIAGNOSIS — R7309 Other abnormal glucose: Secondary | ICD-10-CM

## 2014-05-19 MED ORDER — AMLODIPINE BESYLATE 10 MG PO TABS: 10.0000 mg | ORAL_TABLET | Freq: Every day | ORAL | Status: DC

## 2014-05-19 MED ORDER — TRAMADOL HCL 50 MG PO TABS: ORAL_TABLET | ORAL | Status: DC

## 2014-05-19 MED ORDER — ATENOLOL 50 MG PO TABS: 50.0000 mg | ORAL_TABLET | Freq: Every day | ORAL | Status: DC

## 2014-05-19 MED ORDER — HYDROCHLOROTHIAZIDE 12.5 MG PO TABS: 12.5000 mg | ORAL_TABLET | Freq: Every day | ORAL | Status: DC

## 2014-05-19 NOTE — Progress Notes (Signed)
Pre visit review using our clinic review tool, if applicable. No additional management support is needed unless otherwise documented below in the visit note. 

## 2014-05-19 NOTE — Progress Notes (Signed)
   Subjective:    Patient ID: Hayley Fox, female    DOB: 12/20/1955, 58 y.o.   MRN: [id number]  HPI Hayley Fox is a 58 year old female nonsmoker who comes in today for general physical examination because of a history of hypertension, osteoarthritis,  Hypertension she takes Tenormin 50 mg and Norvasc 5 mg. BP 148/90. She's also recently beginning these types of numbers at home. We'll increase her Norvasc.  She takes Voltaren 75 mg twice a day for osteoarthritis and tramadol 1/2-1 tablet at bedtime for severe back or hip pain.  She gets routine eye care and dental care would only when she can afford it, she does not do BSE monthly, last mammogram April 2013. We recommended in the past she get a colonoscopy but she's never gone. She denies any history of bowel problems  Her last it was a year ago she's having minimal hot flushes.  She does not exercise on a regular basis and is requesting diet pills,,,,,,, requesting 9   Review of Systems  Constitutional: Negative.   HENT: Negative.   Eyes: Negative.   Respiratory: Negative.   Cardiovascular: Negative.   Gastrointestinal: Negative.   Endocrine: Negative.   Genitourinary: Negative.   Musculoskeletal: Negative.   Skin: Negative.   Allergic/Immunologic: Negative.   Neurological: Negative.   Hematological: Negative.   Psychiatric/Behavioral: Negative.        Objective:   Physical Exam  Constitutional: She appears well-developed and well-nourished.  HENT:  Head: Normocephalic and atraumatic.  Right Ear: External ear normal.  Left Ear: External ear normal.  Nose: Nose normal.  Mouth/Throat: Oropharynx is clear and moist.  Eyes: EOM are normal. Pupils are equal, round, and reactive to light.  Neck: Normal range of motion. Neck supple. No JVD present. No tracheal deviation present. No thyromegaly present.  Cardiovascular: Normal rate, regular rhythm, normal heart sounds and intact distal pulses.  Exam reveals no gallop and no friction  rub.   No murmur heard. Pulmonary/Chest: Effort normal and breath sounds normal. No stridor. No respiratory distress. She has no wheezes. She has no rales. She exhibits no tenderness.  Abdominal: Soft. Bowel sounds are normal. She exhibits no distension and no mass. There is no tenderness. There is no rebound and no guarding.  Genitourinary: Vagina normal. Guaiac negative stool. No vaginal discharge found.  Enlarged uterus consistent with previous fibroids  Bilateral breast exam normal  Musculoskeletal: Normal range of motion.  Lymphadenopathy:    She has no cervical adenopathy.  Neurological: She is alert. She has normal reflexes. No cranial nerve deficit. She exhibits normal muscle tone. Coordination normal.  Skin: Skin is warm and dry. No rash noted. No erythema. No pallor.  Psychiatric: She has a normal mood and affect. Her behavior is normal. Judgment and thought content normal.  Nursing note and vitals reviewed.         Assessment & Plan:  Hypertension not at goal....... increase Norvasc to 10 mg daily........ continue beta blocker 50 mg daily,,,,,,, follow-up in one month  Osteoarthritis,,,,,,,,,,, Motrin 400 twice a day tramadol 50 mg,,,,,,,, 1/2-1 tablet at bedtime when necessary  Overweight,,,,,,,,,,,, again discussed diet exercise and weight loss........ patient requesting diet pills....... denied

## 2014-05-19 NOTE — Patient Instructions (Addendum)
Norvasc 10 mg and Lopressor 50 mg....... daily in the morning....... BP check every exam..... Follow-up in 6  weeks  When you return bring a record of all your blood pressure readings and the device  For your arthritis I would recommend Motrin 400 mg twice daily with food and tramadol 50 mg.......Marland Kitchen 1/2-1 tablet at bedtime for severe pain  The most important thing for arthritis diet exercise and weight loss  Call and get set up for your mammogram  I will request again for you to go to get a screening colonoscopy  Your fasting blood sugars 165........... should be less than 120............ avoid raw sugar......... labs one week prior to your next appointment in 6 weeks

## 2014-05-20 LAB — CYTOLOGY - PAP

## 2014-06-17 ENCOUNTER — Other Ambulatory Visit: Payer: BC Managed Care – PPO

## 2014-06-23 ENCOUNTER — Other Ambulatory Visit (INDEPENDENT_AMBULATORY_CARE_PROVIDER_SITE_OTHER): Payer: BLUE CROSS/BLUE SHIELD

## 2014-06-23 DIAGNOSIS — R739 Hyperglycemia, unspecified: Secondary | ICD-10-CM

## 2014-06-23 DIAGNOSIS — R7309 Other abnormal glucose: Secondary | ICD-10-CM

## 2014-06-23 LAB — BASIC METABOLIC PANEL
BUN: 16 mg/dL (ref 6–23)
CALCIUM: 9.6 mg/dL (ref 8.4–10.5)
CO2: 29 mEq/L (ref 19–32)
Chloride: 103 mEq/L (ref 96–112)
Creatinine, Ser: 0.7 mg/dL (ref 0.4–1.2)
GFR: 120.13 mL/min (ref >60.00)
GLUCOSE: 186 mg/dL — AB (ref 70–99)
Potassium: 4.3 mEq/L (ref 3.5–5.1)
SODIUM: 141 meq/L (ref 135–145)

## 2014-06-23 LAB — HEMOGLOBIN A1C: HEMOGLOBIN A1C: 10.3 % — AB (ref 4.6–6.5)

## 2014-07-01 ENCOUNTER — Ambulatory Visit (INDEPENDENT_AMBULATORY_CARE_PROVIDER_SITE_OTHER): Payer: BLUE CROSS/BLUE SHIELD | Admitting: Family Medicine

## 2014-07-01 ENCOUNTER — Encounter: Payer: Self-pay | Admitting: Family Medicine

## 2014-07-01 VITALS — BP 120/84 | Temp 98.5°F | Wt 187.0 lb

## 2014-07-01 DIAGNOSIS — I10 Essential (primary) hypertension: Secondary | ICD-10-CM

## 2014-07-01 DIAGNOSIS — E139 Other specified diabetes mellitus without complications: Secondary | ICD-10-CM

## 2014-07-01 MED ORDER — METFORMIN HCL 500 MG PO TABS: ORAL_TABLET | ORAL | Status: DC

## 2014-07-01 NOTE — Progress Notes (Signed)
Pre visit review using our clinic review tool, if applicable. No additional management support is needed unless otherwise documented below in the visit note. 

## 2014-07-01 NOTE — Progress Notes (Signed)
   Subjective:    Patient ID: Hayley Fox, female    DOB: 10-Mar-1956, 59 y.o.   MRN: [id number]  HPI Allure is a 59 year old female nonsmoker who comes in today for follow-up of hypertension  We've been working with her on her blood pressure control. She's currently taking Tenormin 50 mg daily along with Norvasc 10 mg daily and hydrochlorothiazide 12.5 mg daily BP now 120/84. Since we changed her medication we check to be met which showed a blood sugar of 186. A1c was done 10.3.  Family history positive for hypertension and diabetes....Marland KitchenMarland Kitchen her father  Asymptomatic except for 3 pounds of weight loss recently   Review of Systems Negative    Objective:   Physical Exam  Well-developed well-nourished female no acute distress vital signs stable she's afebrile BP 120/84 weight 187      Assessment & Plan:  Hypertension at goal,,,,,, continue current therapy  New-onset diabetes............ discussed diet exercise weight loss and medication

## 2014-07-01 NOTE — Patient Instructions (Signed)
Diet as outlined  Walk 30 minutes daily  Metformin 500 mg.......... one half tab in the morning and one half tab with your evening meal  Fasting blood sugar daily in the morning  Follow-up in one week.......... when you return bring a record of all your blood sugar readings  Continue your blood pressure medicine as outlined

## 2014-07-08 ENCOUNTER — Ambulatory Visit: Payer: BLUE CROSS/BLUE SHIELD | Admitting: Family Medicine

## 2014-07-14 ENCOUNTER — Encounter: Payer: Self-pay | Admitting: Internal Medicine

## 2014-09-26 LAB — HM DIABETES EYE EXAM

## 2014-10-01 ENCOUNTER — Encounter: Payer: Self-pay | Admitting: Family Medicine

## 2014-10-31 ENCOUNTER — Telehealth: Payer: Self-pay | Admitting: *Deleted

## 2014-10-31 DIAGNOSIS — Z1239 Encounter for other screening for malignant neoplasm of breast: Secondary | ICD-10-CM

## 2014-10-31 NOTE — Telephone Encounter (Signed)
Order placed for mammogram.

## 2014-11-25 ENCOUNTER — Ambulatory Visit (INDEPENDENT_AMBULATORY_CARE_PROVIDER_SITE_OTHER): Payer: 59 | Admitting: Family Medicine

## 2014-11-25 ENCOUNTER — Telehealth: Payer: Self-pay | Admitting: Family Medicine

## 2014-11-25 ENCOUNTER — Encounter: Payer: Self-pay | Admitting: Family Medicine

## 2014-11-25 VITALS — BP 128/80 | HR 74 | Temp 98.3°F | Ht 63.75 in | Wt 185.3 lb

## 2014-11-25 DIAGNOSIS — D229 Melanocytic nevi, unspecified: Secondary | ICD-10-CM

## 2014-11-25 DIAGNOSIS — M79641 Pain in right hand: Secondary | ICD-10-CM

## 2014-11-25 DIAGNOSIS — D239 Other benign neoplasm of skin, unspecified: Secondary | ICD-10-CM

## 2014-11-25 DIAGNOSIS — L309 Dermatitis, unspecified: Secondary | ICD-10-CM

## 2014-11-25 MED ORDER — ONETOUCH VERIO W/DEVICE KIT: 1.0000 | PACK | Freq: Once | Status: DC

## 2014-11-25 MED ORDER — TRIAMCINOLONE ACETONIDE 0.05 % EX OINT: TOPICAL_OINTMENT | CUTANEOUS | Status: DC

## 2014-11-25 NOTE — Patient Instructions (Signed)
-  We placed a referral for you as discussed to the dermatologist. It usually takes about 1-2 weeks to process and schedule this referral. If you have not heard from [place] regarding this appointment in 2 weeks please contact our office.  Try the cream for the rash as instructed and follow up as needed  Tylenol 500-1000mg  up to 3 times daily for the hand arthritis AS needed

## 2014-11-25 NOTE — Telephone Encounter (Signed)
Aware and Mechele Claude was advised

## 2014-11-25 NOTE — Telephone Encounter (Signed)
Pt states the BS meter you gave her (accu check aviva plus) worked only 2 times.  Pt has appt w/ dr Maudie Mercury at 2pm and would like a different kind

## 2014-11-25 NOTE — Progress Notes (Signed)
HPI:  R hand pain: -for a few weeks -dx of OA in the hand on PMH -has sometimes in L hand also -proximal IP joints, feel stiff at times and with mild pain   Itchy rash on L arm: -started when at the beach, no rash anywhere else -new exposures to sand, suncreens, etc at the beach -hx of eczema  Mole on L thumb: -she thinks is new -no fevers, malaise, weight loss  ROS: See pertinent positives and negatives per HPI.  Past Medical History  Diagnosis Date  . Asthma   . Anemia     Past Surgical History  Procedure Laterality Date  . Cesarean section      Family History  Problem Relation Age of Onset  . Diabetes Father   . Hypertension Father   . Hypertension Mother     History   Social History  . Marital Status: Single    Spouse Name: N/A  . Number of Children: N/A  . Years of Education: N/A   Social History Main Topics  . Smoking status: Never Smoker   . Smokeless tobacco: Not on file  . Alcohol Use: Yes     Comment: socially  . Drug Use: No  . Sexual Activity: Not Currently    Birth Control/ Protection: None   Other Topics Concern  . None   Social History Narrative     Current outpatient prescriptions:  .  amLODipine (NORVASC) 10 MG tablet, Take 1 tablet (10 mg total) by mouth daily., Disp: 90 tablet, Rfl: 3 .  atenolol (TENORMIN) 50 MG tablet, Take 1 tablet (50 mg total) by mouth daily., Disp: 90 tablet, Rfl: 3 .  diclofenac (VOLTAREN) 75 MG EC tablet, , Disp: , Rfl:  .  hydrochlorothiazide (HYDRODIURIL) 12.5 MG tablet, Take 1 tablet (12.5 mg total) by mouth daily., Disp: 90 tablet, Rfl: 3 .  latanoprost (XALATAN) 0.005 % ophthalmic solution, , Disp: , Rfl:  .  metFORMIN (GLUCOPHAGE) 500 MG tablet, One half tab with breakfast and one half tab with p.m. meal, Disp: 200 tablet, Rfl: 3 .  Multiple Vitamins-Minerals (ONE-A-DAY 50 PLUS PO), Take by mouth., Disp: , Rfl:  .  Probiotic Product (PRO-BIOTIC BLEND PO), Take by mouth., Disp: , Rfl:  .   traMADol (ULTRAM) 50 MG tablet, One half to one tablet twice a day when necessary for severe pain, Disp: 60 tablet, Rfl: 1 .  TRIAMCINOLONE ACETONIDE, TOP, 0.05 % OINT, Apply to affected area 1-2 times daily for 1 week, Disp: 17 g, Rfl: 0  EXAM:  Filed Vitals:   11/25/14 1410  BP: 128/80  Pulse: 74  Temp: 98.3 F (36.8 C)    Body mass index is 32.07 kg/(m^2).  GENERAL: vitals reviewed and listed above, alert, oriented, appears well hydrated and in no acute distress  HEENT: atraumatic, conjunttiva clear, no obvious abnormalities on inspection of external nose and ears  NECK: no obvious masses on inspection  LUNGS: clear to auscultation bilaterally, no wheezes, rales or rhonchi, good air movement  CV: HRRR, no peripheral edema  MS: moves all extremities without noticeable abnormality, mild TTP and enlargement of proximal IP jts bilat hands, normal strength, sensation and ROM in hands, wrists, arms, normal radial pulses and cap refill  SKIN: small (3-86mm) irr shaped very dark nevus on L thumb, a few scattered sm dark nevi elsewhere  PSYCH: pleasant and cooperative, no obvious depression or anxiety  ASSESSMENT AND PLAN:  Discussed the following assessment and plan:  Right hand pain -  suspect OA, tylenol prn, follow up as needed  Dermatitis - Plan: TRIAMCINOLONE ACETONIDE, TOP, 0.05 % OINT - likely contact dermatitis, rtc as needed  Atypical nevus - Plan: Ambulatory referral to Dermatology -this is atypical and dark and advised removal, given location and appearance advised removal with dermatology and referral placed  -Patient advised to return or notify a doctor immediately if symptoms worsen or persist or new concerns arise.  Patient Instructions  -We placed a referral for you as discussed to the dermatologist. It usually takes about 1-2 weeks to process and schedule this referral. If you have not heard from [place] regarding this appointment in 2 weeks please contact our  office.  Try the cream for the rash as instructed and follow up as needed  Tylenol 500-1000mg  up to 3 times daily for the hand arthritis AS needed      KIM, HANNAH R.

## 2014-11-25 NOTE — Progress Notes (Signed)
Pre visit review using our clinic review tool, if applicable. No additional management support is needed unless otherwise documented below in the visit note. 

## 2014-11-25 NOTE — Addendum Note (Signed)
Addended by: Agnes Lawrence on: 11/25/2014 03:32 PM   Modules accepted: Orders

## 2014-12-10 ENCOUNTER — Telehealth: Payer: Self-pay | Admitting: Family Medicine

## 2014-12-10 MED ORDER — GLUCOSE BLOOD VI STRP: ORAL_STRIP | Status: DC

## 2014-12-10 NOTE — Telephone Encounter (Signed)
Pt needs test strips of accu check aviva plus Pt has one left and   [facility]/ [address]  Pt not sure if this is the least expensive place to get these

## 2014-12-10 NOTE — Telephone Encounter (Signed)
Pt would like to pick up a script and take to another pharmacy. She said the medicine was to high at [facility]   glucose blood (ACCU-CHEK AVIVA) test strip

## 2014-12-11 MED ORDER — GLUCOSE BLOOD VI STRP: ORAL_STRIP | Status: DC

## 2014-12-11 NOTE — Telephone Encounter (Signed)
Rx ready to pick up.  Attempted to call patient but voice mail is not set up yet.

## 2014-12-16 ENCOUNTER — Telehealth: Payer: Self-pay | Admitting: *Deleted

## 2014-12-16 DIAGNOSIS — E139 Other specified diabetes mellitus without complications: Secondary | ICD-10-CM

## 2014-12-16 NOTE — Telephone Encounter (Signed)
Follow up DM.  a1c ordered Diabetic bundle

## 2014-12-18 ENCOUNTER — Ambulatory Visit (INDEPENDENT_AMBULATORY_CARE_PROVIDER_SITE_OTHER): Payer: 59 | Admitting: Family Medicine

## 2014-12-18 ENCOUNTER — Encounter: Payer: Self-pay | Admitting: Family Medicine

## 2014-12-18 VITALS — BP 136/80 | HR 67 | Temp 98.1°F | Ht 63.75 in | Wt 187.5 lb

## 2014-12-18 DIAGNOSIS — I10 Essential (primary) hypertension: Secondary | ICD-10-CM | POA: Diagnosis not present

## 2014-12-18 DIAGNOSIS — E119 Type 2 diabetes mellitus without complications: Secondary | ICD-10-CM | POA: Diagnosis not present

## 2014-12-18 DIAGNOSIS — E785 Hyperlipidemia, unspecified: Secondary | ICD-10-CM

## 2014-12-18 NOTE — Progress Notes (Signed)
Pre visit review using our clinic review tool, if applicable. No additional management support is needed unless otherwise documented below in the visit note. 

## 2014-12-18 NOTE — Patient Instructions (Addendum)
BEFORE YOU LEAVE: -fasting lab appointment tomorrow if possible  Consider adding cholesterol medication depending on labs  Increase the metformin to 1 pill twice dialy for 1 week, then 2 pills in the morning and one at night for 1 week, then 2 pills (1000mg ) twice daily with breakfast and dinner  Schedule your physical with Dr. Sherren Mocha in 4 months  FOR YOUR DIABETES:  []  Eat a healthy low carb diet (avoid sweets, sweet drinks, breads, potatoes, rice, etc.) and ensure 3 small meals daily.  []  Get AT LEAST 150 minutes of cardiovascular exercise per week - 30 minutes per day is best of sustained sweaty exercise.  []  Take all of your medications every day as directed by your doctor. Call your doctor immediately if you have any questions about your medications or are running low.  []  Check your blood sugar often and when you feel unwell and keep a log to bring to all health appointments. FASTING: before you eat anything in the morning POSTPRANDIAL: 1-2 hours after a meal  []  If any low blood sugars < 70, eat a snack and call your doctor immediately.  []  See an eye doctor every year and fax your diabetic eye exam to our office.  Fax: [phone]  []  Take good care of your feet and keep them soft and callus free. Check your feet daily and wear comfortable shoes. See your doctor immediately if you have any cuts, calluses or wounds on your feet.  You may find this book helpful for your diabetes. It is available on [place] and is fairly inexpensive.   Dr. Janene Harvey Program for Reversing Diabetes: The Scientifically Proven System for Reversing Diabetes without Drugs  http://www.nealbarnard.org/index.cfm

## 2014-12-18 NOTE — Progress Notes (Signed)
HPI:  Hayley Fox  Is a pleasant 59 yo F patient of Dr Sherren Mocha here for a follow up appointment for her:  Diabetes: -new dx recently with PCP -meds: metformin 224m bid and diet and exercise advised -complications: unknown -home BS: does not check -walking some, diet is not great - she has been watching her sweets, she is watching the carbs when possible -denies: polyuria, polydipsia, vision changes, wound son feet -foot exam: -eye exam: 2 months ago Lab Results  Component Value Date   HGBA1C 10.3* 06/23/2014    HTN: -meds: norvasc 181m atenolol 5052maily, hctz 12.5mg71mily - not on acei or arb -denies: CP, SOB, DOE  HLD: Lab Results  Component Value Date   CHOL 254* 05/05/2014   HDL 49.00 05/05/2014   LDLCALC 167* 05/05/2014   TRIG 190.0* 05/05/2014   CHOLHDL 5 05/05/2014  -denies: hx statin use or intolerance   ROS: See pertinent positives and negatives per HPI.  Past Medical History  Diagnosis Date  . Asthma   . Anemia     Past Surgical History  Procedure Laterality Date  . Cesarean section      Family History  Problem Relation Age of Onset  . Diabetes Father   . Hypertension Father   . Hypertension Mother     History   Social History  . Marital Status: Single    Spouse Name: N/A  . Number of Children: N/A  . Years of Education: N/A   Social History Main Topics  . Smoking status: Never Smoker   . Smokeless tobacco: Not on file  . Alcohol Use: Yes     Comment: socially  . Drug Use: No  . Sexual Activity: Not Currently    Birth Control/ Protection: None   Other Topics Concern  . None   Social History Narrative     Current outpatient prescriptions:  .  amLODipine (NORVASC) 10 MG tablet, Take 1 tablet (10 mg total) by mouth daily., Disp: 90 tablet, Rfl: 3 .  atenolol (TENORMIN) 50 MG tablet, Take 1 tablet (50 mg total) by mouth daily., Disp: 90 tablet, Rfl: 3 .  Blood Glucose Monitoring Suppl (ONETOUCH VERIO) W/DEVICE KIT, 1 kit by Does  not apply route once. Use as directed, Disp: 1 kit, Rfl: 0 .  diclofenac (VOLTAREN) 75 MG EC tablet, , Disp: , Rfl:  .  glucose blood (ACCU-CHEK AVIVA) test strip, Test glucose once daily, Dx E11.9, Disp: 100 each, Rfl: 3 .  hydrochlorothiazide (HYDRODIURIL) 12.5 MG tablet, Take 1 tablet (12.5 mg total) by mouth daily., Disp: 90 tablet, Rfl: 3 .  latanoprost (XALATAN) 0.005 % ophthalmic solution, , Disp: , Rfl:  .  metFORMIN (GLUCOPHAGE) 500 MG tablet, One half tab with breakfast and one half tab with p.m. meal, Disp: 200 tablet, Rfl: 3 .  Multiple Vitamins-Minerals (ONE-A-DAY 50 PLUS PO), Take by mouth., Disp: , Rfl:  .  Probiotic Product (PRO-BIOTIC BLEND PO), Take by mouth., Disp: , Rfl:  .  traMADol (ULTRAM) 50 MG tablet, One half to one tablet twice a day when necessary for severe pain, Disp: 60 tablet, Rfl: 1 .  TRIAMCINOLONE ACETONIDE, TOP, 0.05 % OINT, Apply to affected area 1-2 times daily for 1 week, Disp: 17 g, Rfl: 0  EXAM:  Filed Vitals:   12/18/14 1514  BP: 136/80  Pulse: 67  Temp: 98.1 F (36.7 C)    Body mass index is 32.45 kg/(m^2).  GENERAL: vitals reviewed and listed above, alert, oriented, appears well  hydrated and in no acute distress  HEENT: atraumatic, conjunttiva clear, no obvious abnormalities on inspection of external nose and ears  NECK: no obvious masses on inspection  LUNGS: clear to auscultation bilaterally, no wheezes, rales or rhonchi, good air movement  CV: HRRR, no peripheral edema  MS: moves all extremities without noticeable abnormality  PSYCH: pleasant and cooperative, no obvious depression or anxiety  ASSESSMENT AND PLAN:  Discussed the following assessment and plan:  Type 2 diabetes mellitus without complication - Plan: Microalbumin/Creatinine Ratio, Urine  Essential hypertension - Plan: Basic metabolic panel  Hyperlipemia - Plan: Lipid panel  -recheck labs -consider statin  -consider arb   -Given diabetic and decrease norvasc  if needed -will likely need adjustment in diabetic meds -advised yearly eye exam and pneumonia vaccine -advised follow up with PCP in 4 months for preventive visit with PCP -Patient advised to return or notify a doctor immediately if symptoms worsen or persist or new concerns arise.  Patient Instructions  BEFORE YOU LEAVE: -fasting lab appointment tomorrow if possible  Consider adding cholesterol medication depending on labs  Increase the metformin to 1 pill twice dialy for 1 week, then 2 pills in the morning and one at night for 1 week, then 2 pills (1064m) twice daily with breakfast and dinner  Schedule your physical with Dr. TSherren Mochain 4 months  FOR YOUR DIABETES:  '[]'  Eat a healthy low carb diet (avoid sweets, sweet drinks, breads, potatoes, rice, etc.) and ensure 3 small meals daily.  '[]'  Get AT LEAST 150 minutes of cardiovascular exercise per week - 30 minutes per day is best of sustained sweaty exercise.  '[]'  Take all of your medications every day as directed by your doctor. Call your doctor immediately if you have any questions about your medications or are running low.  '[]'  Check your blood sugar often and when you feel unwell and keep a log to bring to all health appointments. FASTING: before you eat anything in the morning POSTPRANDIAL: 1-2 hours after a meal  '[]'  If any low blood sugars < 70, eat a snack and call your doctor immediately.  '[]'  See an eye doctor every year and fax your diabetic eye exam to our office.  Fax: [phone] '[]'  Take good care of your feet and keep them soft and callus free. Check your feet daily and wear comfortable shoes. See your doctor immediately if you have any cuts, calluses or wounds on your feet.      KColin BentonR.

## 2014-12-19 ENCOUNTER — Other Ambulatory Visit (INDEPENDENT_AMBULATORY_CARE_PROVIDER_SITE_OTHER): Payer: 59

## 2014-12-19 ENCOUNTER — Other Ambulatory Visit: Payer: Self-pay

## 2014-12-19 DIAGNOSIS — E785 Hyperlipidemia, unspecified: Secondary | ICD-10-CM | POA: Diagnosis not present

## 2014-12-19 DIAGNOSIS — I10 Essential (primary) hypertension: Secondary | ICD-10-CM

## 2014-12-19 DIAGNOSIS — E139 Other specified diabetes mellitus without complications: Secondary | ICD-10-CM | POA: Diagnosis not present

## 2014-12-19 DIAGNOSIS — E119 Type 2 diabetes mellitus without complications: Secondary | ICD-10-CM

## 2014-12-19 LAB — MICROALBUMIN / CREATININE URINE RATIO
Creatinine,U: 293.5 mg/dL
MICROALB UR: 4.5 mg/dL — AB (ref 0.0–1.9)
Microalb Creat Ratio: 1.5 mg/g (ref 0.0–30.0)

## 2014-12-19 LAB — LIPID PANEL
CHOLESTEROL: 220 mg/dL — AB (ref 0–200)
HDL: 49.7 mg/dL (ref >39.00)
LDL CALC: 131 mg/dL — AB (ref 0–99)
NONHDL: 170.3
Total CHOL/HDL Ratio: 4
Triglycerides: 197 mg/dL — ABNORMAL HIGH (ref 0.0–149.0)
VLDL: 39.4 mg/dL (ref 0.0–40.0)

## 2014-12-19 LAB — BASIC METABOLIC PANEL
BUN: 15 mg/dL (ref 6–23)
CALCIUM: 9.5 mg/dL (ref 8.4–10.5)
CHLORIDE: 102 meq/L (ref 96–112)
CO2: 27 meq/L (ref 19–32)
CREATININE: 0.71 mg/dL (ref 0.40–1.20)
GFR: 108.31 mL/min (ref >60.00)
Glucose, Bld: 132 mg/dL — ABNORMAL HIGH (ref 70–99)
POTASSIUM: 4.1 meq/L (ref 3.5–5.1)
SODIUM: 139 meq/L (ref 135–145)

## 2014-12-19 LAB — HEMOGLOBIN A1C: Hgb A1c MFr Bld: 8.3 % — ABNORMAL HIGH (ref 4.6–6.5)

## 2014-12-19 MED ORDER — GLUCOSE BLOOD VI STRP: ORAL_STRIP | Status: DC

## 2014-12-30 MED ORDER — PRAVASTATIN SODIUM 40 MG PO TABS: 40.0000 mg | ORAL_TABLET | Freq: Every day | ORAL | Status: DC

## 2015-01-08 ENCOUNTER — Telehealth: Payer: Self-pay | Admitting: Family Medicine

## 2015-01-08 MED ORDER — METFORMIN HCL 500 MG PO TABS: 1000.0000 mg | ORAL_TABLET | Freq: Two times a day (BID) | ORAL | Status: DC

## 2015-01-08 NOTE — Telephone Encounter (Signed)
Pt needs new rx metformin 500 mg twice a day #60 w/refills  call into [facility] [address]

## 2015-01-09 MED ORDER — METFORMIN HCL 500 MG PO TABS: 1000.0000 mg | ORAL_TABLET | Freq: Two times a day (BID) | ORAL | Status: DC

## 2015-01-09 NOTE — Telephone Encounter (Signed)
Rx should be metformin 500 -Take 2 tablets bid.  Rx resent to pharmacy for clarification.  +

## 2015-01-09 NOTE — Addendum Note (Signed)
Addended by: Colleen Can on: 01/09/2015 02:52 PM   Modules accepted: Orders

## 2015-01-29 ENCOUNTER — Encounter: Payer: Self-pay | Admitting: Family Medicine

## 2015-01-29 ENCOUNTER — Ambulatory Visit (INDEPENDENT_AMBULATORY_CARE_PROVIDER_SITE_OTHER): Payer: 59 | Admitting: Family Medicine

## 2015-01-29 VITALS — BP 138/86 | HR 73 | Temp 98.4°F | Ht 63.75 in | Wt 185.3 lb

## 2015-01-29 DIAGNOSIS — R05 Cough: Secondary | ICD-10-CM

## 2015-01-29 DIAGNOSIS — J069 Acute upper respiratory infection, unspecified: Secondary | ICD-10-CM

## 2015-01-29 DIAGNOSIS — R059 Cough, unspecified: Secondary | ICD-10-CM

## 2015-01-29 MED ORDER — ALBUTEROL SULFATE HFA 108 (90 BASE) MCG/ACT IN AERS: 2.0000 | INHALATION_SPRAY | Freq: Four times a day (QID) | RESPIRATORY_TRACT | Status: DC | PRN

## 2015-01-29 MED ORDER — BENZONATATE 100 MG PO CAPS: 100.0000 mg | ORAL_CAPSULE | Freq: Three times a day (TID) | ORAL | Status: DC | PRN

## 2015-01-29 NOTE — Progress Notes (Signed)
HPI:  "cold": -started: about 2 weeks ago -symptoms:nasal congestion, sore throat, cough  -denies:fever, SOB, NVD, tooth pain -has tried: musinex -sick contacts/travel/risks: denies flu exposure, tick exposure or or Ebola risks -Hx of: asthma - but not much issues as an adultz  ROS: See pertinent positives and negatives per HPI.  Past Medical History  Diagnosis Date  . Asthma   . Anemia     Past Surgical History  Procedure Laterality Date  . Cesarean section      Family History  Problem Relation Age of Onset  . Diabetes Father   . Hypertension Father   . Hypertension Mother     Social History   Social History  . Marital Status: Single    Spouse Name: N/A  . Number of Children: N/A  . Years of Education: N/A   Social History Main Topics  . Smoking status: Never Smoker   . Smokeless tobacco: None  . Alcohol Use: Yes     Comment: socially  . Drug Use: No  . Sexual Activity: Not Currently    Birth Control/ Protection: None   Other Topics Concern  . None   Social History Narrative     Current outpatient prescriptions:  .  amLODipine (NORVASC) 10 MG tablet, Take 1 tablet (10 mg total) by mouth daily., Disp: 90 tablet, Rfl: 3 .  atenolol (TENORMIN) 50 MG tablet, Take 1 tablet (50 mg total) by mouth daily., Disp: 90 tablet, Rfl: 3 .  Blood Glucose Monitoring Suppl (ONETOUCH VERIO) W/DEVICE KIT, 1 kit by Does not apply route once. Use as directed, Disp: 1 kit, Rfl: 0 .  diclofenac (VOLTAREN) 75 MG EC tablet, , Disp: , Rfl:  .  glucose blood (ACCU-CHEK AVIVA) test strip, Test glucose once daily, Dx E11.9, Disp: 100 each, Rfl: 3 .  hydrochlorothiazide (HYDRODIURIL) 12.5 MG tablet, Take 1 tablet (12.5 mg total) by mouth daily., Disp: 90 tablet, Rfl: 3 .  latanoprost (XALATAN) 0.005 % ophthalmic solution, , Disp: , Rfl:  .  metFORMIN (GLUCOPHAGE) 500 MG tablet, Take 2 tablets (1,000 mg total) by mouth 2 (two) times daily with a meal., Disp: 120 tablet, Rfl: 3 .   Multiple Vitamins-Minerals (ONE-A-DAY 50 PLUS PO), Take by mouth., Disp: , Rfl:  .  pravastatin (PRAVACHOL) 40 MG tablet, Take 1 tablet (40 mg total) by mouth daily., Disp: 30 tablet, Rfl: 3 .  Probiotic Product (PRO-BIOTIC BLEND PO), Take by mouth., Disp: , Rfl:  .  traMADol (ULTRAM) 50 MG tablet, One half to one tablet twice a day when necessary for severe pain, Disp: 60 tablet, Rfl: 1 .  TRIAMCINOLONE ACETONIDE, TOP, 0.05 % OINT, Apply to affected area 1-2 times daily for 1 week, Disp: 17 g, Rfl: 0 .  albuterol (PROVENTIL HFA;VENTOLIN HFA) 108 (90 BASE) MCG/ACT inhaler, Inhale 2 puffs into the lungs every 6 (six) hours as needed., Disp: 1 Inhaler, Rfl: 0 .  benzonatate (TESSALON PERLES) 100 MG capsule, Take 1 capsule (100 mg total) by mouth 3 (three) times daily as needed for cough., Disp: 20 capsule, Rfl: 0  EXAM:  Filed Vitals:   01/29/15 1602  BP: 138/86  Pulse: 73  Temp: 98.4 F (36.9 C)    Body mass index is 32.07 kg/(m^2).  GENERAL: vitals reviewed and listed above, alert, oriented, appears well hydrated and in no acute distress  HEENT: atraumatic, conjunttiva clear, no obvious abnormalities on inspection of external nose and ears, normal appearance of ear canals and TMs, clear nasal congestion,  mild post oropharyngeal erythema with PND, no tonsillar edema or exudate, no sinus TTP  NECK: no obvious masses on inspection  LUNGS: clear to auscultation bilaterally, no wheezes, rales or rhonchi, good air movement  CV: HRRR, no peripheral edema  MS: moves all extremities without noticeable abnormality  PSYCH: pleasant and cooperative, no obvious depression or anxiety  ASSESSMENT AND PLAN:  Discussed the following assessment and plan:  Acute upper respiratory infection  Cough - Plan: benzonatate (TESSALON PERLES) 100 MG capsule, albuterol (PROVENTIL HFA;VENTOLIN HFA) 108 (90 BASE) MCG/ACT inhaler  -given HPI and exam findings today, a serious infection or illness is  unlikely. We discussed potential etiologies, with VURI being most likely, and advised supportive care and monitoring. We discussed treatment side effects, likely course, antibiotic misuse, transmission, and signs of developing a serious illness. -discussed options, trial alb and tessalon, xray and consider inhaled steroid if worsening or not improving -of course, we advised to return or notify a doctor immediately if symptoms worsen or persist or new concerns arise.    Patient Instructions  INSTRUCTIONS FOR UPPER RESPIRATORY INFECTION:  -plenty of rest and fluids  -albuterol as needed   -if not improving in 1-2 weeks please follow up  -nasal saline wash 2-3 times daily (use prepackaged nasal saline or bottled/distilled water if making your own)   -can use AFRIN nasal spray for drainage and nasal congestion - but do NOT use longer then 3-4 days  -can use tylenol (in no history of liver disease) or ibuprofen (if no history of kidney disease, bowel bleeding or significant heart disease) as directed for aches and sorethroat  -in the winter time, using a humidifier at night is helpful (please follow cleaning instructions)  -if you are taking a cough medication - use only as directed, may also try a teaspoon of honey to coat the throat and throat lozenges. If given a cough medication with codeine or hydrocodone or other narcotic please be advised that this contains a strong and  potentially addicting medication. Please follow instructions carefully, take as little as possible and only use AS NEEDED for severe cough. Discuss potential side effects with your pharmacy. Please do not drive or operate machinery while taking these types of medications. Please do not take other sedating medications, drugs or alcohol while taking this medication without discussing with your doctor.  -for sore throat, salt water gargles can help  -follow up if you have fevers, facial pain, tooth pain, difficulty  breathing or are worsening or symptoms persist longer then expected  Upper Respiratory Infection, Adult An upper respiratory infection (URI) is also known as the common cold. It is often caused by a type of germ (virus). Colds are easily spread (contagious). You can pass it to others by kissing, coughing, sneezing, or drinking out of the same glass. Usually, you get better in 1 to 3  weeks.  However, the cough can last for even longer. HOME CARE   Only take medicine as told by your doctor. Follow instructions provided above.  Drink enough water and fluids to keep your pee (urine) clear or pale yellow.  Get plenty of rest.  Return to work when your temperature is < 100 for 24 hours or as told by your doctor. You may use a face mask and wash your hands to stop your cold from spreading. GET HELP RIGHT AWAY IF:   After the first few days, you feel you are getting worse.  You have questions about your medicine.  You  have chills, shortness of breath, or red spit (mucus).  You have pain in the face for more then 1-2 days, especially when you bend forward.  You have a fever, puffy (swollen) neck, pain when you swallow, or white spots in the back of your throat.  You have a bad headache, ear pain, sinus pain, or chest pain.  You have a high-pitched whistling sound when you breathe in and out (wheezing).  You cough up blood.  You have sore muscles or a stiff neck. MAKE SURE YOU:   Understand these instructions.  Will watch your condition.  Will get help right away if you are not doing well or get worse. Document Released: 11/23/2007 Document Revised: 08/29/2011 Document Reviewed: 09/11/2013 [facility] Patient Information 2015 [place], [place]. This information is not intended to replace advice given to you by your health care provider. Make sure you discuss any questions you have with your health care provider.      Colin Benton R.

## 2015-01-29 NOTE — Progress Notes (Signed)
Pre visit review using our clinic review tool, if applicable. No additional management support is needed unless otherwise documented below in the visit note. 

## 2015-01-29 NOTE — Patient Instructions (Signed)
INSTRUCTIONS FOR UPPER RESPIRATORY INFECTION:  -plenty of rest and fluids  -albuterol as needed   -if not improving in 1-2 weeks please follow up  -nasal saline wash 2-3 times daily (use prepackaged nasal saline or bottled/distilled water if making your own)   -can use AFRIN nasal spray for drainage and nasal congestion - but do NOT use longer then 3-4 days  -can use tylenol (in no history of liver disease) or ibuprofen (if no history of kidney disease, bowel bleeding or significant heart disease) as directed for aches and sorethroat  -in the winter time, using a humidifier at night is helpful (please follow cleaning instructions)  -if you are taking a cough medication - use only as directed, may also try a teaspoon of honey to coat the throat and throat lozenges. If given a cough medication with codeine or hydrocodone or other narcotic please be advised that this contains a strong and  potentially addicting medication. Please follow instructions carefully, take as little as possible and only use AS NEEDED for severe cough. Discuss potential side effects with your pharmacy. Please do not drive or operate machinery while taking these types of medications. Please do not take other sedating medications, drugs or alcohol while taking this medication without discussing with your doctor.  -for sore throat, salt water gargles can help  -follow up if you have fevers, facial pain, tooth pain, difficulty breathing or are worsening or symptoms persist longer then expected  Upper Respiratory Infection, Adult An upper respiratory infection (URI) is also known as the common cold. It is often caused by a type of germ (virus). Colds are easily spread (contagious). You can pass it to others by kissing, coughing, sneezing, or drinking out of the same glass. Usually, you get better in 1 to 3  weeks.  However, the cough can last for even longer. HOME CARE   Only take medicine as told by your doctor. Follow  instructions provided above.  Drink enough water and fluids to keep your pee (urine) clear or pale yellow.  Get plenty of rest.  Return to work when your temperature is < 100 for 24 hours or as told by your doctor. You may use a face mask and wash your hands to stop your cold from spreading. GET HELP RIGHT AWAY IF:   After the first few days, you feel you are getting worse.  You have questions about your medicine.  You have chills, shortness of breath, or red spit (mucus).  You have pain in the face for more then 1-2 days, especially when you bend forward.  You have a fever, puffy (swollen) neck, pain when you swallow, or white spots in the back of your throat.  You have a bad headache, ear pain, sinus pain, or chest pain.  You have a high-pitched whistling sound when you breathe in and out (wheezing).  You cough up blood.  You have sore muscles or a stiff neck. MAKE SURE YOU:   Understand these instructions.  Will watch your condition.  Will get help right away if you are not doing well or get worse. Document Released: 11/23/2007 Document Revised: 08/29/2011 Document Reviewed: 09/11/2013 [facility] Patient Information 2015 [place], [place]. This information is not intended to replace advice given to you by your health care provider. Make sure you discuss any questions you have with your health care provider.

## 2015-02-13 ENCOUNTER — Ambulatory Visit (INDEPENDENT_AMBULATORY_CARE_PROVIDER_SITE_OTHER): Payer: 59 | Admitting: *Deleted

## 2015-02-13 DIAGNOSIS — Z0000 Encounter for general adult medical examination without abnormal findings: Secondary | ICD-10-CM | POA: Diagnosis not present

## 2015-02-16 LAB — TB SKIN TEST: TB Skin Test: NEGATIVE

## 2015-03-09 ENCOUNTER — Telehealth: Payer: Self-pay | Admitting: Family Medicine

## 2015-03-09 MED ORDER — HYDROCODONE-HOMATROPINE 5-1.5 MG/5ML PO SYRP: ORAL_SOLUTION | ORAL | Status: DC

## 2015-03-09 NOTE — Telephone Encounter (Signed)
Pt is still having cold/cough sx from her last visit with Dr Maudie Mercury. Pt is now questioning if she might need to have a chest xray or if she needs to come in for an appointment. If she needs to come in she would like to know if she needs to see Dr Maudie Mercury or Dr Sherren Mocha.

## 2015-03-09 NOTE — Telephone Encounter (Signed)
Rx per Dr Sherren Mocha.  Patient is aware.

## 2015-03-09 NOTE — Telephone Encounter (Signed)
Pt calling back to fu. Pls advise. Pt would at least like abx to help w/ the cold and cough.  [facility]/ [address]

## 2015-03-10 NOTE — Telephone Encounter (Signed)
Patient here to pick up prescription.

## 2015-04-21 ENCOUNTER — Other Ambulatory Visit (INDEPENDENT_AMBULATORY_CARE_PROVIDER_SITE_OTHER): Payer: 59

## 2015-04-21 DIAGNOSIS — Z0000 Encounter for general adult medical examination without abnormal findings: Secondary | ICD-10-CM | POA: Diagnosis not present

## 2015-04-21 LAB — CBC WITH DIFFERENTIAL/PLATELET
BASOS PCT: 1.3 % (ref 0.0–3.0)
Basophils Absolute: 0.1 10*3/uL (ref 0.0–0.1)
EOS ABS: 0.1 10*3/uL (ref 0.0–0.7)
Eosinophils Relative: 2.5 % (ref 0.0–5.0)
HCT: 37.3 % (ref 36.0–46.0)
HEMOGLOBIN: 12.2 g/dL (ref 12.0–15.0)
Lymphocytes Relative: 39.8 % (ref 12.0–46.0)
Lymphs Abs: 2.3 10*3/uL (ref 0.7–4.0)
MCHC: 32.7 g/dL (ref 30.0–36.0)
MCV: 78.8 fl (ref 78.0–100.0)
MONO ABS: 0.4 10*3/uL (ref 0.1–1.0)
Monocytes Relative: 7 % (ref 3.0–12.0)
NEUTROS ABS: 2.8 10*3/uL (ref 1.4–7.7)
Neutrophils Relative %: 49.4 % (ref 43.0–77.0)
PLATELETS: 311 10*3/uL (ref 150.0–400.0)
RBC: 4.73 Mil/uL (ref 3.87–5.11)
RDW: 14.3 % (ref 11.5–15.5)
WBC: 5.7 10*3/uL (ref 4.0–10.5)

## 2015-04-21 LAB — POCT URINALYSIS DIPSTICK
Blood, UA: NEGATIVE
Glucose, UA: NEGATIVE
NITRITE UA: NEGATIVE
PH UA: 6.5
Spec Grav, UA: 1.02
UROBILINOGEN UA: 0.2

## 2015-04-21 LAB — HEPATIC FUNCTION PANEL
ALT: 26 U/L (ref 0–35)
AST: 24 U/L (ref 0–37)
Albumin: 4.5 g/dL (ref 3.5–5.2)
Alkaline Phosphatase: 110 U/L (ref 39–117)
BILIRUBIN DIRECT: 0.1 mg/dL (ref 0.0–0.3)
BILIRUBIN TOTAL: 0.5 mg/dL (ref 0.2–1.2)
Total Protein: 8 g/dL (ref 6.0–8.3)

## 2015-04-21 LAB — LIPID PANEL
CHOL/HDL RATIO: 3
Cholesterol: 151 mg/dL (ref 0–200)
HDL: 49.3 mg/dL (ref >39.00)
LDL Cholesterol: 81 mg/dL (ref 0–99)
NonHDL: 101.48
TRIGLYCERIDES: 101 mg/dL (ref 0.0–149.0)
VLDL: 20.2 mg/dL (ref 0.0–40.0)

## 2015-04-21 LAB — MICROALBUMIN / CREATININE URINE RATIO
Creatinine,U: 444.4 mg/dL
MICROALB/CREAT RATIO: 0.9 mg/g (ref 0.0–30.0)
Microalb, Ur: 4.2 mg/dL — ABNORMAL HIGH (ref 0.0–1.9)

## 2015-04-21 LAB — BASIC METABOLIC PANEL
BUN: 13 mg/dL (ref 6–23)
CALCIUM: 9.7 mg/dL (ref 8.4–10.5)
CHLORIDE: 106 meq/L (ref 96–112)
CO2: 28 mEq/L (ref 19–32)
CREATININE: 0.75 mg/dL (ref 0.40–1.20)
GFR: 101.56 mL/min (ref >60.00)
Glucose, Bld: 121 mg/dL — ABNORMAL HIGH (ref 70–99)
Potassium: 4.6 mEq/L (ref 3.5–5.1)
Sodium: 141 mEq/L (ref 135–145)

## 2015-04-21 LAB — HEMOGLOBIN A1C: Hgb A1c MFr Bld: 7.6 % — ABNORMAL HIGH (ref 4.6–6.5)

## 2015-04-21 LAB — TSH: TSH: 1.72 u[IU]/mL (ref 0.35–4.50)

## 2015-04-26 ENCOUNTER — Other Ambulatory Visit: Payer: Self-pay | Admitting: Family Medicine

## 2015-04-28 ENCOUNTER — Encounter: Payer: 59 | Admitting: Family Medicine

## 2015-05-11 ENCOUNTER — Ambulatory Visit (INDEPENDENT_AMBULATORY_CARE_PROVIDER_SITE_OTHER): Payer: 59 | Admitting: Family Medicine

## 2015-05-11 ENCOUNTER — Encounter: Payer: Self-pay | Admitting: Family Medicine

## 2015-05-11 VITALS — BP 130/90 | Temp 98.1°F | Wt 175.0 lb

## 2015-05-11 DIAGNOSIS — E139 Other specified diabetes mellitus without complications: Secondary | ICD-10-CM

## 2015-05-11 MED ORDER — METFORMIN HCL 1000 MG PO TABS: 1000.0000 mg | ORAL_TABLET | Freq: Every day | ORAL | Status: DC

## 2015-05-11 NOTE — Progress Notes (Signed)
   Subjective:    Patient ID: Hayley Fox, female    DOB: 05-15-1956, 59 y.o.   MRN: [id number]  HPI Hayley Fox is a 59 year old female nonsmoker who comes in today for follow-up of diabetes  We've increased her metformin to 1000 mg twice a day over the last year. Blood sugar is in the 120 range A1c 7.6%. Since August she's lost 10 pounds. She's down to 175. She doesn't wish to take anymore medication. She wishes to get her blood sugar down via diet and exercise  Had a long discussion about the genetic implications of decreasing insulin production over time.   Review of Systems    review of systems negative except she due for a physical exam which he would like to defer for now Objective:   Physical Exam  Well-developed well-nourished female no acute distress vital signs stable she's afebrile      Assessment & Plan:  Diabetes type 2............ continue metformin 1000 mg twice a day follow-up in 3 months

## 2015-05-11 NOTE — Patient Instructions (Signed)
Metformin 1000 mg.......... one tablet before breakfast and one tablet a 68 female  Continue diet and exercise program  Set up a 30 minute appointment in 3 months for general physical examination  We did all your physical labs this past week therefore you will not be any physical labs when you come in except an A1c one week prior to your physical

## 2015-05-11 NOTE — Progress Notes (Signed)
Pre visit review using our clinic review tool, if applicable. No additional management support is needed unless otherwise documented below in the visit note. 

## 2015-05-25 ENCOUNTER — Other Ambulatory Visit: Payer: Self-pay | Admitting: *Deleted

## 2015-05-25 ENCOUNTER — Other Ambulatory Visit: Payer: Self-pay | Admitting: Family Medicine

## 2015-05-25 MED ORDER — PRAVASTATIN SODIUM 40 MG PO TABS: ORAL_TABLET | ORAL | Status: DC

## 2015-05-25 MED ORDER — METFORMIN HCL 1000 MG PO TABS: 1000.0000 mg | ORAL_TABLET | Freq: Two times a day (BID) | ORAL | Status: DC

## 2015-06-11 ENCOUNTER — Other Ambulatory Visit: Payer: Self-pay | Admitting: Family Medicine

## 2015-09-02 ENCOUNTER — Other Ambulatory Visit: Payer: 59

## 2015-09-07 ENCOUNTER — Other Ambulatory Visit (INDEPENDENT_AMBULATORY_CARE_PROVIDER_SITE_OTHER): Payer: BLUE CROSS/BLUE SHIELD

## 2015-09-07 DIAGNOSIS — I10 Essential (primary) hypertension: Secondary | ICD-10-CM

## 2015-09-07 DIAGNOSIS — E119 Type 2 diabetes mellitus without complications: Secondary | ICD-10-CM

## 2015-09-07 LAB — BASIC METABOLIC PANEL
BUN: 18 mg/dL (ref 6–23)
CALCIUM: 9.7 mg/dL (ref 8.4–10.5)
CO2: 26 mEq/L (ref 19–32)
CREATININE: 0.64 mg/dL (ref 0.40–1.20)
Chloride: 103 mEq/L (ref 96–112)
GFR: 121.79 mL/min (ref >60.00)
Glucose, Bld: 77 mg/dL (ref 70–99)
POTASSIUM: 4 meq/L (ref 3.5–5.1)
Sodium: 138 mEq/L (ref 135–145)

## 2015-09-07 LAB — HEMOGLOBIN A1C: HEMOGLOBIN A1C: 6.9 % — AB (ref 4.6–6.5)

## 2015-09-09 ENCOUNTER — Encounter: Payer: 59 | Admitting: Family Medicine

## 2015-09-16 ENCOUNTER — Encounter: Payer: Self-pay | Admitting: Family Medicine

## 2015-09-18 ENCOUNTER — Encounter: Payer: Self-pay | Admitting: Family Medicine

## 2015-10-02 ENCOUNTER — Telehealth: Payer: Self-pay | Admitting: Family Medicine

## 2015-10-02 ENCOUNTER — Encounter (HOSPITAL_COMMUNITY): Payer: Self-pay

## 2015-10-02 ENCOUNTER — Emergency Department (HOSPITAL_COMMUNITY)
Admission: EM | Admit: 2015-10-02 | Discharge: 2015-10-02 | Disposition: A | Discharge status: Home / Self Care | Payer: BLUE CROSS/BLUE SHIELD | Attending: Emergency Medicine | Admitting: Emergency Medicine

## 2015-10-02 DIAGNOSIS — Z7984 Long term (current) use of oral hypoglycemic drugs: Secondary | ICD-10-CM | POA: Insufficient documentation

## 2015-10-02 DIAGNOSIS — Z79899 Other long term (current) drug therapy: Secondary | ICD-10-CM | POA: Insufficient documentation

## 2015-10-02 DIAGNOSIS — N12 Tubulo-interstitial nephritis, not specified as acute or chronic: Secondary | ICD-10-CM | POA: Insufficient documentation

## 2015-10-02 DIAGNOSIS — J45909 Unspecified asthma, uncomplicated: Secondary | ICD-10-CM | POA: Insufficient documentation

## 2015-10-02 DIAGNOSIS — E119 Type 2 diabetes mellitus without complications: Secondary | ICD-10-CM | POA: Insufficient documentation

## 2015-10-02 DIAGNOSIS — Z862 Personal history of diseases of the blood and blood-forming organs and certain disorders involving the immune mechanism: Secondary | ICD-10-CM | POA: Insufficient documentation

## 2015-10-02 DIAGNOSIS — I10 Essential (primary) hypertension: Secondary | ICD-10-CM | POA: Insufficient documentation

## 2015-10-02 HISTORY — DX: Essential (primary) hypertension: I10

## 2015-10-02 HISTORY — DX: Type 2 diabetes mellitus without complications: E11.9

## 2015-10-02 LAB — URINE MICROSCOPIC-ADD ON

## 2015-10-02 LAB — CBC WITH DIFFERENTIAL/PLATELET
Basophils Absolute: 0.1 10*3/uL (ref 0.0–0.1)
Basophils Relative: 1 %
EOS ABS: 0.2 10*3/uL (ref 0.0–0.7)
EOS PCT: 2 %
HCT: 36.7 % (ref 36.0–46.0)
HEMOGLOBIN: 12.1 g/dL (ref 12.0–15.0)
LYMPHS ABS: 2.4 10*3/uL (ref 0.7–4.0)
Lymphocytes Relative: 31 %
MCH: 25.8 pg — AB (ref 26.0–34.0)
MCHC: 33 g/dL (ref 30.0–36.0)
MCV: 78.3 fL (ref 78.0–100.0)
MONO ABS: 0.6 10*3/uL (ref 0.1–1.0)
Monocytes Relative: 8 %
Neutro Abs: 4.4 10*3/uL (ref 1.7–7.7)
Neutrophils Relative %: 58 %
Platelets: 339 10*3/uL (ref 150–400)
RBC: 4.69 MIL/uL (ref 3.87–5.11)
RDW: 14.6 % (ref 11.5–15.5)
WBC: 7.7 10*3/uL (ref 4.0–10.5)

## 2015-10-02 LAB — URINALYSIS, ROUTINE W REFLEX MICROSCOPIC
Bilirubin Urine: NEGATIVE
GLUCOSE, UA: NEGATIVE mg/dL
Ketones, ur: NEGATIVE mg/dL
Nitrite: NEGATIVE
PH: 6.5 (ref 5.0–8.0)
PROTEIN: 30 mg/dL — AB
SPECIFIC GRAVITY, URINE: 1.017 (ref 1.005–1.030)

## 2015-10-02 LAB — BASIC METABOLIC PANEL
ANION GAP: 10 (ref 5–15)
BUN: 18 mg/dL (ref 6–20)
CHLORIDE: 103 mmol/L (ref 101–111)
CO2: 26 mmol/L (ref 22–32)
Calcium: 9.3 mg/dL (ref 8.9–10.3)
Creatinine, Ser: 0.7 mg/dL (ref 0.44–1.00)
GFR calc Af Amer: >60 mL/min (ref >60)
Glucose, Bld: 94 mg/dL (ref 65–99)
POTASSIUM: 4 mmol/L (ref 3.5–5.1)
Sodium: 139 mmol/L (ref 135–145)

## 2015-10-02 LAB — CBG MONITORING, ED: Glucose-Capillary: 85 mg/dL (ref 65–99)

## 2015-10-02 MED ORDER — CEPHALEXIN 500 MG PO CAPS: 500.0000 mg | ORAL_CAPSULE | Freq: Three times a day (TID) | ORAL | Status: DC

## 2015-10-02 MED ORDER — CEPHALEXIN 500 MG PO CAPS
500.0000 mg | ORAL_CAPSULE | Freq: Once | ORAL | Status: AC
  Administered 2015-10-02: 500 mg via ORAL
  Filled 2015-10-02: qty 1

## 2015-10-02 MED ORDER — HYDROCODONE-ACETAMINOPHEN 5-325 MG PO TABS
1.0000 | ORAL_TABLET | Freq: Once | ORAL | Status: AC
  Administered 2015-10-02: 1 via ORAL
  Filled 2015-10-02: qty 1

## 2015-10-02 NOTE — Discharge Instructions (Signed)
Please return without fail for worsening symptoms, including fevers, vomiting unable to keep down food or fluids, worsening pain, or any other symptoms concerning to you.  Pyelonephritis, Adult Pyelonephritis is a kidney infection. The kidneys are the organs that filter a person's blood and move waste out of the bloodstream and into the urine. Urine passes from the kidneys, through the ureters, and into the bladder. There are two main types of pyelonephritis:  Infections that come on quickly without any warning (acute pyelonephritis).  Infections that last for a long period of time (chronic pyelonephritis). In most cases, the infection clears up with treatment and does not cause further problems. More severe infections or chronic infections can sometimes spread to the bloodstream or lead to other problems with the kidneys. CAUSES This condition is usually caused by:  Bacteria traveling from the bladder to the kidney through infected urine. The urine in the bladder can become infected with bacteria from:  Bladder infection (cystitis).  Inflammation of the prostate gland (prostatitis).  Sexual intercourse, in females.  Bacteria traveling from the bloodstream to the kidney. RISK FACTORS This condition is more likely to develop in:  Pregnant women.  Older people.  People who have diabetes.  People who have kidney stones or bladder stones.  People who have other abnormalities of the kidney or ureter.  People who have a catheter placed in the bladder.  People who have cancer.  People who are sexually active.  Women who use spermicides.  People who have had a prior urinary tract infection. SYMPTOMS Symptoms of this condition include:  Frequent urination.  Strong or persistent urge to urinate.  Burning or stinging when urinating.  Abdominal pain.  Back pain.  Pain in the side or flank area.  Fever.  Chills.  Blood in the urine, or dark  urine.  Nausea.  Vomiting. DIAGNOSIS This condition may be diagnosed based on:  Medical history and physical exam.  Urine tests.  Blood tests. You may also have imaging tests of the kidneys, such as an ultrasound or CT scan. TREATMENT Treatment for this condition may depend on the severity of the infection.  If the infection is mild and is found early, you may be treated with antibiotic medicines taken by mouth. You will need to drink fluids to remain hydrated.  If the infection is more severe, you may need to stay in the hospital and receive antibiotics given directly into a vein through an IV tube. You may also need to receive fluids through an IV tube if you are not able to remain hydrated. After your hospital stay, you may need to take oral antibiotics for a period of time. Other treatments may be required, depending on the cause of the infection. HOME CARE INSTRUCTIONS Medicines  Take over-the-counter and prescription medicines only as told by your health care provider.  If you were prescribed an antibiotic medicine, take it as told by your health care provider. Do not stop taking the antibiotic even if you start to feel better. General Instructions  Drink enough fluid to keep your urine clear or pale yellow.  Avoid caffeine, tea, and carbonated beverages. They tend to irritate the bladder.  Urinate often. Avoid holding in urine for long periods of time.  Urinate before and after sex.  After a bowel movement, women should cleanse from front to back. Use each tissue only once.  Keep all follow-up visits as told by your health care provider. This is important. SEEK MEDICAL CARE IF:  Your  symptoms do not get better after 2 days of treatment.  Your symptoms get worse.  You have a fever. SEEK IMMEDIATE MEDICAL CARE IF:  You are unable to take your antibiotics or fluids.  You have shaking chills.  You vomit.  You have severe flank or back pain.  You have  extreme weakness or fainting.   This information is not intended to replace advice given to you by your health care provider. Make sure you discuss any questions you have with your health care provider.   Document Released: 06/06/2005 Document Revised: 02/25/2015 Document Reviewed: 09/29/2014 Elsevier Interactive Patient Education [employer].

## 2015-10-02 NOTE — ED Notes (Signed)
Pt c/o L flank pain radiating into LUQ, increasing urinary frequency, and emesis x once a week x "several weeks."  Pain score 3/10.  Hx of DM.  Pt reports that she doesn't check bllood sugar, but takes her medications.

## 2015-10-02 NOTE — ED Notes (Signed)
CBG 85

## 2015-10-02 NOTE — ED Provider Notes (Signed)
CSN: [id number]     Arrival date & time 10/02/15  1572 History   First MD Initiated Contact with Patient 10/02/15 1102     Chief Complaint  Patient presents with  . Flank Pain  . Urinary Frequency  . Emesis     (Consider location/radiation/quality/duration/timing/severity/associated sxs/prior Treatment) HPI  60 year old female who presents with urinary frequency and left lower back pain. History of hypertension, diabetes, and asthma. States that she has had increased urinary frequency over the past week associated with intermittent left lower back pain. Her pain seems to be increased in intensity for the past 24 hours. No recent nausea, vomiting, diarrhea, fevers or chills. States that she holds her urine a lot at work and throughout the day but has not had a UTI before in the past. No history of kidney stones. No dysuria or hematuria.  Past Medical History  Diagnosis Date  . Asthma   . Anemia   . Diabetes mellitus without complication ([place])   . Hypertension    Past Surgical History  Procedure Laterality Date  . Cesarean section     Family History  Problem Relation Age of Onset  . Diabetes Father   . Hypertension Father   . Hypertension Mother    Social History  Substance Use Topics  . Smoking status: Former Research scientist (life sciences)  . Smokeless tobacco: None  . Alcohol Use: Yes     Comment: socially   OB History    Gravida Para Term Preterm AB TAB SAB Ectopic Multiple Living   '6 4 3 1 2 2    4     ' Review of Systems 10/14 systems reviewed and are negative other than those stated in the HPI    Allergies  Review of patient's allergies indicates no known allergies.  Home Medications   Prior to Admission medications   Medication Sig Start Date End Date Taking? Authorizing Provider  amLODipine (NORVASC) 10 MG tablet TAKE 1 TABLET BY MOUTH DAILY 06/11/15  Yes Dorena Cookey, MD  atenolol (TENORMIN) 50 MG tablet TAKE 1 TABLET BY MOUTH DAILY 06/11/15  Yes Dorena Cookey, MD  metFORMIN  (GLUCOPHAGE) 1000 MG tablet Take 1 tablet (1,000 mg total) by mouth 2 (two) times daily with a meal. 05/25/15  Yes Dorena Cookey, MD  Multiple Vitamins-Minerals (ONE-A-DAY 50 PLUS PO) Take by mouth.   Yes Historical Provider, MD  pravastatin (PRAVACHOL) 40 MG tablet TAKE 1 TABLET(40 MG) BY MOUTH DAILY 05/25/15  Yes Dorena Cookey, MD  Probiotic Product (PRO-BIOTIC BLEND PO) Take 1 tablet by mouth daily.    Yes Historical Provider, MD  albuterol (PROVENTIL HFA;VENTOLIN HFA) 108 (90 BASE) MCG/ACT inhaler Inhale 2 puffs into the lungs every 6 (six) hours as needed. Patient not taking: Reported on 10/02/2015 01/29/15   Lucretia Kern, DO  benzonatate (TESSALON PERLES) 100 MG capsule Take 1 capsule (100 mg total) by mouth 3 (three) times daily as needed for cough. Patient not taking: Reported on 10/02/2015 01/29/15   Lucretia Kern, DO  Blood Glucose Monitoring Suppl (ONETOUCH VERIO) W/DEVICE KIT 1 kit by Does not apply route once. Use as directed 11/25/14   Dorena Cookey, MD  cephALEXin (KEFLEX) 500 MG capsule Take 1 capsule (500 mg total) by mouth 3 (three) times daily. 10/02/15   Forde Dandy, MD  glucose blood (ACCU-CHEK AVIVA) test strip Test glucose once daily, Dx E11.9 12/19/14   Dorena Cookey, MD  hydrochlorothiazide (HYDRODIURIL) 12.5 MG tablet Take 1 tablet (12.5  mg total) by mouth daily. Patient not taking: Reported on 10/02/2015 05/19/14   Dorena Cookey, MD  traMADol Veatrice Bourbon) 50 MG tablet One half to one tablet twice a day when necessary for severe pain Patient not taking: Reported on 10/02/2015 05/19/14   Dorena Cookey, MD  TRIAMCINOLONE ACETONIDE, TOP, 0.05 % OINT Apply to affected area 1-2 times daily for 1 week Patient not taking: Reported on 10/02/2015 11/25/14   Lucretia Kern, DO   BP 127/80 mmHg  Pulse 77  Temp(Src) 98.8 F (37.1 C) (Oral)  Resp 14  SpO2 98%  LMP 07/29/2011 Physical Exam Physical Exam  Nursing note and vitals reviewed. Constitutional: Well developed, well nourished,  non-toxic, and in no acute distress Head: Normocephalic and atraumatic.  Mouth/Throat: Oropharynx is clear and moist.  Neck: Normal range of motion. Neck supple.  Cardiovascular: Normal rate and regular rhythm.   Pulmonary/Chest: Effort normal and breath sounds normal.  Abdominal: Soft. There is no tenderness. There is no rebound and no guarding. There is mild left flank tenderness. Musculoskeletal: Normal range of motion.  Neurological: Alert, no facial droop, fluent speech, moves all extremities symmetrically Skin: Skin is warm and dry.  Psychiatric: Cooperative  ED Course  Procedures (including critical care time) Labs Review Labs Reviewed  URINALYSIS, ROUTINE W REFLEX MICROSCOPIC (NOT AT [facility]) - Abnormal; Notable for the following:    APPearance CLOUDY (*)    Hgb urine dipstick MODERATE (*)    Protein, ur 30 (*)    Leukocytes, UA LARGE (*)    All other components within normal limits  URINE MICROSCOPIC-ADD ON - Abnormal; Notable for the following:    Squamous Epithelial / LPF 0-5 (*)    Bacteria, UA FEW (*)    All other components within normal limits  CBC WITH DIFFERENTIAL/PLATELET - Abnormal; Notable for the following:    MCH 25.8 (*)    All other components within normal limits  URINE CULTURE  BASIC METABOLIC PANEL  CBG MONITORING, ED  CBG MONITORING, ED    Imaging Review No results found. I have personally reviewed and evaluated these images and lab results as part of my medical decision-making.   EKG Interpretation None      MDM   Final diagnoses:  Pyelonephritis    60 year old female who presents with urinary frequency and left-sided low back pain. Is afebrile on presentation with normal vital signs. Has a soft and benign abdomen. Minimal left flank tenderness noted. UA with evidence of infection including nitrites, leukoesterase, and some blood. Minimal pain, and felt to be suggestive of pyelonephritis and not nephrolithiasis. No leukocytosis and normal  kidney function. Given course of keflex. Discussed strict return instructions to evaluate for concurrent stone  Or worsening infection if not responding to treatment. Urine sent for culture. Strict return and follow-up instructions reviewed. She expressed understanding of all discharge instructions and felt comfortable with the plan of care.     Forde Dandy, MD 10/02/15 [phone]

## 2015-10-04 LAB — URINE CULTURE

## 2015-10-05 ENCOUNTER — Telehealth (HOSPITAL_BASED_OUTPATIENT_CLINIC_OR_DEPARTMENT_OTHER): Payer: Self-pay | Admitting: Emergency Medicine

## 2015-10-05 NOTE — Telephone Encounter (Signed)
Post ED Visit - Positive Culture Follow-up  Culture report reviewed by antimicrobial stewardship pharmacist:  []  Elenor Quinones, Pharm.D. []  Heide Guile, Pharm.D., BCPS []  Parks Neptune, Pharm.D. []  Alycia Rossetti, Pharm.D., BCPS []  [place], Pharm.D., BCPS, AAHIVP []  Legrand Como, Pharm.D., BCPS, AAHIVP []  Milus Glazier, Pharm.D. []  Stephens November, Pharm.Raul Del PharmD  Positive urine culture Treated with cephalexin, organism sensitive to the same and no further patient follow-up is required at this time.  Hazle Nordmann 10/05/2015, 12:00 PM

## 2015-10-05 NOTE — Telephone Encounter (Signed)
Attempted to call patient but unable to leave message.  Voicemail box not set up yet.

## 2015-10-05 NOTE — Telephone Encounter (Signed)
[facility] - Client [facility] Patient Name: Hayley Fox Gender: Female DOB: 11-Sep-1955 Age: 60 Y 38 M Return Phone Number: [phone] (Primary) Address: City/State/Zip:  [place] Client [facility] - Client Client Site [facility] - Night Physician Christie Nottingham Contact Type Call Who Is Calling Patient / Member / Family / Caregiver Call Type Triage / Clinical Caller Name Rilla Relationship To Patient Self Return Phone Number [phone] (Primary) Chief Complaint Flank Pain Reason for Call Symptomatic / Request for [place] states she is having flank pain. Has been hurting for weeks, it's getting worse. Vomiting, and urinating a lot. Feeling hot. She can't afford the strips to check her sugar- she's a Diabetic. Up all night urinating- Urine clear, but had like foaming bubbles in it. PreDisposition Call Doctor Translation No Nurse Assessment Nurse: Markus Daft, RN, Sherre Poot Date/Time Eilene Ghazi Time): 10/02/2015 8:21:47 AM Confirm and document reason for call. If symptomatic, describe symptoms. You must click the next button to save text entered. ---Caller states she is having left flank pain radiates to the lower abdomen. Has been hurting for weeks, it's getting worse. She is urinating frequently about every 10 min. Lots of urgency. She is able to urinate well, but still feels like she needs to go just after emptying her bladder. Urine clear, but had like foaming bubbles in it. Denies pain with urination, but "feels funny" like she has to squeeze more out. No itching or burning. Not sexually active. Vomited last week twice, none since. -- She can't afford the strips to check her sugar NIDDM - takes Metformin. Has the patient traveled out of the country within the last 30 days? ---Not Applicable Does the patient have any new or worsening  symptoms? ---Yes Will a triage be completed? ---Yes Related visit to physician within the last 2 weeks? ---No Does the PT have any chronic conditions? (i.e. diabetes, asthma, etc.) ---Yes List chronic conditions. ---NIDDM, HTN, High cholesterol Is this a behavioral health or substance abuse call? ---No PLEASE NOTE: All timestamps contained within this report are represented as [place] Standard Time. CONFIDENTIALTY NOTICE: This fax transmission is intended only for the addressee. It contains information that is legally privileged, confidential or otherwise protected from use or disclosure. If you are not the intended recipient, you are strictly prohibited from reviewing, disclosing, copying using or disseminating any of this information or taking any action in reliance on or regarding this information. If you have received this fax in error, please notify us immediately by telephone so that we can arrange for its return to [place]. Phone: [phone], Toll-Free: [phone], Fax: [phone] Page: 2 of 2 Call Id: [id number] Guidelines Guideline Title Affirmed Question Affirmed Notes Nurse Date/Time Eilene Ghazi Time) Flank Pain [1] Abdominal pain AND [2] age [place] 49 Markus Daft, [place], [facility] 10/02/2015 8:25:46 AM Disp. Time Eilene Ghazi Time) Disposition Final User 10/02/2015 8:28:52 AM Go to ED Now Yes Markus Daft, RN, Kenton Kingfisher Understands: Yes Disagree/Comply: Comply Care Advice Given Per Guideline GO TO ED NOW: You need to be seen in the Emergency Department. Go to the ER at ___________ [place] now. Drive carefully. DRIVING: Another adult should drive. BRING MEDICINES: * Please bring a list of your current medicines when you go to the Emergency Department (ER). * It is also a good idea to bring the pill bottles too. This will help the doctor to make certain you are taking the right medicines and the right dose.  CARE ADVICE given per Flank Pain (Adult) guideline. Comments User: Mayford Knife, RN  Date/Time (Eastern Time): 10/02/2015 8:27:44 AM Pain gradually came on, and now more severe, and last night was severe. Now rates pain 7/10. Referrals [facility] - ED

## 2015-10-05 NOTE — Telephone Encounter (Signed)
Tried both home and cell numbers with no answer and voicemail not set up yet.

## 2015-10-09 ENCOUNTER — Encounter: Payer: Self-pay | Admitting: Family Medicine

## 2015-10-09 ENCOUNTER — Ambulatory Visit (INDEPENDENT_AMBULATORY_CARE_PROVIDER_SITE_OTHER): Payer: BLUE CROSS/BLUE SHIELD | Admitting: Family Medicine

## 2015-10-09 VITALS — BP 110/72 | HR 82 | Temp 98.7°F | Ht 63.75 in | Wt 170.0 lb

## 2015-10-09 DIAGNOSIS — E785 Hyperlipidemia, unspecified: Secondary | ICD-10-CM | POA: Diagnosis not present

## 2015-10-09 DIAGNOSIS — I10 Essential (primary) hypertension: Secondary | ICD-10-CM

## 2015-10-09 DIAGNOSIS — Z7189 Other specified counseling: Secondary | ICD-10-CM

## 2015-10-09 DIAGNOSIS — E119 Type 2 diabetes mellitus without complications: Secondary | ICD-10-CM

## 2015-10-09 DIAGNOSIS — E1165 Type 2 diabetes mellitus with hyperglycemia: Secondary | ICD-10-CM | POA: Insufficient documentation

## 2015-10-09 DIAGNOSIS — Z7689 Persons encountering health services in other specified circumstances: Secondary | ICD-10-CM

## 2015-10-09 MED ORDER — ATENOLOL 25 MG PO TABS: ORAL_TABLET | ORAL | Status: DC

## 2015-10-09 NOTE — Patient Instructions (Signed)
BEFORE YOU LEAVE: -schedule physical in 2-3 months; if you can come fasting that would be great  Taper off of the atenolol using the new tablets (25mg ) that I sent to your pharmacy: - 37.5mg , 1.5 tablets for 1 week  -then 25mg , 1 tablet daily for 1 week -then 12.5 mg (1/2 tablet) daily for 1 week -then stop  We recommend the following healthy lifestyle measures: - eat a healthy whole foods diet consisting of regular small meals composed of vegetables, fruits, beans, nuts, seeds, healthy meats such as white chicken and fish and whole grains.  - avoid sweets, white starchy foods, fried foods, fast food, processed foods, sodas, red meet and other fattening foods.  - get a least 150-300 minutes of aerobic exercise per week.

## 2015-10-09 NOTE — Progress Notes (Signed)
Pre visit review using our clinic review tool, if applicable. No additional management support is needed unless otherwise documented below in the visit note. 

## 2015-10-09 NOTE — Progress Notes (Signed)
HPI:  Hayley Fox is here to establish care.  Last PCP and physical:  Has the following chronic problems that require follow up and concerns today:  Pyelonephritis: -Diagnosed and treated in emergency department 10/02/2015 -Reports: doing much better, mild urgency still - still on abx for another 4 days -Denies: vomiting, fevers, hematuria, dysuria  Diabetes: -chronic -hgba1c 6.9 [id number] -Complications:  None per patient -Last eye exam: sees opthalmologist, [place] [facility] -Medications:metfromin 1000mg  bid -Lifestyle: aerobic exercise - 2 days per week, walking for 30 minutes; diet is much improved -Denies: low blood sugars, vision changes  HTN: -chronic -meds:  atenolol 50(rxed by prior pcp), norvasc 10 -hctz made her pee too much -Denies history of palpitations or arrhythmia; denies history of ACE inhibitor or arb intolerance or allergy -Denies: History heart disease, chest pain, shortness of breath, swelling, headaches or vision changes -she wants to stop the atenolol, has made dramatic changes in lifestyle and feels bp is too low at times  Hyperlipidemia: -chronic -Meds: Pravastatin 40 mg -LDL 81 last check 04/2015  Past medical history of asthma and anemia per list in chart. Last CBC normal 09/2015. No asthma medications on medication list she reports this was in her childhood and has not had symptoms as an adult.   ROS negative for unless reported above: fevers, unintentional weight loss, hearing or vision loss, chest pain, palpitations, struggling to breath, hemoptysis, melena, hematochezia, hematuria, falls, loc, si, thoughts of self harm  Past Medical History  Diagnosis Date  . Childhood asthma     resolved  . Anemia     resolved, related to menorrhagia remotely  . Diabetes mellitus without complication ([place])   . Hypertension   . Actinic keratosis 03/15/2012  . Osteoarthrosis, hand 06/06/2008    Qualifier: Diagnosis of  By: Sherren Mocha MD, Jory Ee     Past  Surgical History  Procedure Laterality Date  . Cesarean section      Family History  Problem Relation Age of Onset  . Diabetes Father   . Hypertension Father   . Hypertension Mother     Social History   Social History  . Marital Status: Single    Spouse Name: N/A  . Number of Children: N/A  . Years of Education: N/A   Social History Main Topics  . Smoking status: Former Research scientist (life sciences)  . Smokeless tobacco: None  . Alcohol Use: Yes     Comment: socially  . Drug Use: No  . Sexual Activity: Not Currently    Birth Control/ Protection: None   Other Topics Concern  . None   Social History Narrative   Work or School: live in caregiver      Home Situation: lives with client several days per week      Spiritual Beliefs: catholic      Lifestyle: exercises 2 days per week; diet is pretty healthy           Current outpatient prescriptions:  .  amLODipine (NORVASC) 10 MG tablet, TAKE 1 TABLET BY MOUTH DAILY, Disp: 90 tablet, Rfl: 1 .  atenolol (TENORMIN) 25 MG tablet, Taper according to instructions from visit., Disp: 90 tablet, Rfl: 0 .  cephALEXin (KEFLEX) 500 MG capsule, Take 1 capsule (500 mg total) by mouth 3 (three) times daily., Disp: 30 capsule, Rfl: 0 .  glucose blood (ACCU-CHEK AVIVA) test strip, Test glucose once daily, Dx E11.9, Disp: 100 each, Rfl: 3 .  metFORMIN (GLUCOPHAGE) 1000 MG tablet, Take 1 tablet (1,000 mg total) by  mouth 2 (two) times daily with a meal., Disp: 200 tablet, Rfl: 3 .  Multiple Vitamins-Minerals (ONE-A-DAY 50 PLUS PO), Take by mouth., Disp: , Rfl:  .  pravastatin (PRAVACHOL) 40 MG tablet, TAKE 1 TABLET(40 MG) BY MOUTH DAILY, Disp: 90 tablet, Rfl: 3 .  Probiotic Product (PRO-BIOTIC BLEND PO), Take 1 tablet by mouth daily. , Disp: , Rfl:   EXAM:  Filed Vitals:   10/09/15 1106  BP: 110/72  Pulse: 82  Temp: 98.7 F (37.1 C)    Body mass index is 29.42 kg/(m^2).  GENERAL: vitals reviewed and listed above, alert, oriented, appears well  hydrated and in no acute distress  HEENT: atraumatic, conjunttiva clear, no obvious abnormalities on inspection of external nose and ears  NECK: no obvious masses on inspection  LUNGS: clear to auscultation bilaterally, no wheezes, rales or rhonchi, good air movement  CV: HRRR, no peripheral edema  MS: moves all extremities without noticeable abnormality  PSYCH: pleasant and cooperative, no obvious depression or anxiety  ASSESSMENT AND PLAN:  Discussed the following assessment and plan:  Type 2 diabetes mellitus without complication, without long-term current use of insulin (HCC)  Hyperlipemia  Essential hypertension  Encounter to establish care   -Slow taper off beta blocker per her desires, if she needs further assistance for her blood pressure would try an arb instead -Lifestyle recommendations discussed at length -We reviewed the PMH, PSH, FH, SH, Meds and Allergies. -We provided refills for any medications we will prescribe as needed. -We addressed current concerns per orders and patient instructions. -We have asked for records for pertinent exams, studies, vaccines and notes from previous providers. -We have advised patient to follow up per instructions below. -Advised needs physical exam to review health maintenance measures and update. Advised to schedule in 3 months.  -Patient advised to return or notify a doctor immediately if symptoms worsen or persist or new concerns arise.  Patient Instructions  BEFORE YOU LEAVE: -schedule physical in 2-3 months; if you can come fasting that would be great  Taper off of the atenolol using the new tablets (25mg ) that I sent to your pharmacy: - 37.5mg , 1.5 tablets for 1 week  -then 25mg , 1 tablet daily for 1 week -then 12.5 mg (1/2 tablet) daily for 1 week -then stop  We recommend the following healthy lifestyle measures: - eat a healthy whole foods diet consisting of regular small meals composed of vegetables, fruits,  beans, nuts, seeds, healthy meats such as white chicken and fish and whole grains.  - avoid sweets, white starchy foods, fried foods, fast food, processed foods, sodas, red meet and other fattening foods.  - get a least 150-300 minutes of aerobic exercise per week.       Colin Benton R.

## 2015-12-15 ENCOUNTER — Ambulatory Visit: Payer: BLUE CROSS/BLUE SHIELD | Admitting: Family Medicine

## 2016-01-04 ENCOUNTER — Telehealth: Payer: Self-pay | Admitting: Family Medicine

## 2016-01-04 NOTE — Telephone Encounter (Signed)
I thought she tapered off of this? Those were the instructions at her last visit and follow up in 2-3 months was advised. Can you see if she needs appt of find out why we received refill request?

## 2016-01-05 NOTE — Telephone Encounter (Signed)
Unable to leave a message at the pts home number due to phone not taking calls-cell number disconnected.

## 2016-01-07 NOTE — Telephone Encounter (Signed)
Unable to leave a message due to no voicemail set up at the pts home number.

## 2016-01-12 ENCOUNTER — Ambulatory Visit: Payer: BLUE CROSS/BLUE SHIELD | Admitting: Family Medicine

## 2016-01-22 ENCOUNTER — Telehealth: Payer: Self-pay | Admitting: *Deleted

## 2016-01-22 ENCOUNTER — Ambulatory Visit: Payer: BLUE CROSS/BLUE SHIELD | Admitting: Family Medicine

## 2016-01-22 ENCOUNTER — Other Ambulatory Visit: Payer: Self-pay | Admitting: Family Medicine

## 2016-01-22 DIAGNOSIS — Z0289 Encounter for other administrative examinations: Secondary | ICD-10-CM

## 2016-01-22 NOTE — Progress Notes (Deleted)
HPI:  Diabetes: -chronic -hgba1c 6.9 [id number] -Complications:  None per patient -Last eye exam: sees opthalmologist, [place] [facility] -Medications:metfromin 1000mg  bid -Lifestyle: aerobic exercise - 2 days per week, walking for 30 minutes; diet is much improved -Denies: low blood sugars, vision changes  HTN: -chronic -meds:  atenolol 50(rxed by prior pcp), norvasc 10 -hctz made her pee too much -Denies history of palpitations or arrhythmia; denies history of ACE inhibitor or arb intolerance or allergy -Denies: History heart disease, chest pain, shortness of breath, swelling, headaches or vision changes -she wants to stop the atenolol, has made dramatic changes in lifestyle and feels bp is too low at times  Hyperlipidemia: -chronic -Meds: Pravastatin 40 mg -LDL 81 last check 04/2015   ROS: See pertinent positives and negatives per HPI.  Past Medical History:  Diagnosis Date  . Actinic keratosis 03/15/2012  . Anemia    resolved, related to menorrhagia remotely  . Childhood asthma    resolved  . Diabetes mellitus without complication ([place])   . Hypertension   . 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 CESAREAN SECTION      Family History  Problem Relation Age of Onset  . Diabetes Father   . Hypertension Father   . Hypertension Mother     Social History   Social History  . Marital status: Single    Spouse name: N/A  . Number of children: N/A  . Years of education: N/A   Social History Main Topics  . Smoking status: Former Research scientist (life sciences)  . Smokeless tobacco: Not on file  . Alcohol use Yes     Comment: socially  . Drug use: No  . Sexual activity: Not Currently    Birth control/ protection: None   Other Topics Concern  . Not on file   Social History Narrative   Work or School: live in caregiver      Home Situation: lives with client several days per week       Spiritual Beliefs: catholic      Lifestyle: exercises 2 days per week; diet is pretty healthy           Current Outpatient Prescriptions:  .  amLODipine (NORVASC) 10 MG tablet, TAKE 1 TABLET BY MOUTH DAILY, Disp: 90 tablet, Rfl: 1 .  atenolol (TENORMIN) 25 MG tablet, Taper according to instructions from visit., Disp: 90 tablet, Rfl: 0 .  cephALEXin (KEFLEX) 500 MG capsule, Take 1 capsule (500 mg total) by mouth 3 (three) times daily., Disp: 30 capsule, Rfl: 0 .  glucose blood (ACCU-CHEK AVIVA) test strip, Test glucose once daily, Dx E11.9, Disp: 100 each, Rfl: 3 .  metFORMIN (GLUCOPHAGE) 1000 MG tablet, Take 1 tablet (1,000 mg total) by mouth 2 (two) times daily with a meal., Disp: 200 tablet, Rfl: 3 .  Multiple Vitamins-Minerals (ONE-A-DAY 50 PLUS PO), Take by mouth., Disp: , Rfl:  .  pravastatin (PRAVACHOL) 40 MG tablet, TAKE 1 TABLET(40 MG) BY MOUTH DAILY, Disp: 90 tablet, Rfl: 3 .  Probiotic Product (PRO-BIOTIC BLEND PO), Take 1 tablet by mouth daily. , Disp: , Rfl:   EXAM:  There were no vitals filed for this visit.  There is no height or weight on file to calculate BMI.  GENERAL: vitals reviewed and listed above, alert, oriented, appears well hydrated and in no acute distress  HEENT: atraumatic, conjunttiva clear, no obvious abnormalities on inspection  of external nose and ears  NECK: no obvious masses on inspection  LUNGS: clear to auscultation bilaterally, no wheezes, rales or rhonchi, good air movement  CV: HRRR, no peripheral edema  MS: moves all extremities without noticeable abnormality  PSYCH: pleasant and cooperative, no obvious depression or anxiety  ASSESSMENT AND PLAN:  Discussed the following assessment and plan:  No diagnosis found.  -Patient advised to return or notify a doctor immediately if symptoms worsen or persist or new concerns arise.  There are no Patient Instructions on file for this visit.  Colin Benton R., DO '

## 2016-01-22 NOTE — Telephone Encounter (Signed)
Unable to leave a message due to no voicemail being set up at the pts home number-cell number disconnected.

## 2016-01-22 NOTE — Telephone Encounter (Signed)
Patient was a no-show for today's visit and I was unable to leave a message at the pts home number due to no voicemail being set up-cell number disconnected.

## 2016-02-18 ENCOUNTER — Other Ambulatory Visit: Payer: Self-pay | Admitting: Family Medicine

## 2016-02-18 NOTE — Telephone Encounter (Signed)
PCP Dr.Kim

## 2016-02-26 ENCOUNTER — Telehealth: Payer: Self-pay | Admitting: Family Medicine

## 2016-02-26 NOTE — Telephone Encounter (Signed)
Pt need new Rx for amLODipine, atenolol and metFORMIN   Pharm:  [facility] on [address]  [phone]

## 2016-02-26 NOTE — Telephone Encounter (Signed)
I called the pt and informed her of the previous message in regards to the Atenolol being denied as she was to taper off of this.  Patient stated she did not know she was to taper off of this and was taking the decreased dose since her last visit and has now been out for a week or two.  She stated her insurance was dropped and she cannot come in for a physical at this time.  Message sent to Dr Maudie Mercury for recommendations and the pt is aware I will call her back.

## 2016-02-29 MED ORDER — PRAVASTATIN SODIUM 40 MG PO TABS: ORAL_TABLET | ORAL | 0 refills | Status: DC

## 2016-02-29 MED ORDER — AMLODIPINE BESYLATE 10 MG PO TABS: 10.0000 mg | ORAL_TABLET | Freq: Every day | ORAL | 0 refills | Status: DC

## 2016-02-29 MED ORDER — METFORMIN HCL 1000 MG PO TABS: 1000.0000 mg | ORAL_TABLET | Freq: Two times a day (BID) | ORAL | 0 refills | Status: DC

## 2016-02-29 NOTE — Addendum Note (Signed)
Addended by: Agnes Lawrence on: 02/29/2016 01:31 PM   Modules accepted: Orders

## 2016-02-29 NOTE — Telephone Encounter (Signed)
See phone note from 9/11.

## 2016-02-29 NOTE — Telephone Encounter (Signed)
I called the pt and informed her of the message below and she stated she has already called for help and she makes too much for one thing and too little for the other.  She was scheduled for a physical appt and is aware per Malachy Mood that he front desk will ask for $80 up front and it could cost more for her visit and she is aware the refills were sent to her pharmacy.

## 2016-02-29 NOTE — Telephone Encounter (Signed)
Let her know I am so sorry to hear about her insurance issues. Please provide her the [place] financial assistance number [phone] I think and let her know about the [facility] clinic. These resources can help her to continue care despite being uninsured.  We can provide 90 days with 1 refill on the amlodipine, metformin and the pravastatin to assist her. Please send rx. However, we would have to to see her to continue to prescribe those medications after that. I would advise against restarting the atenolol as it was a very low dose and is a difficult medication to start and stop if having financial/insurance issues. Would advise that she get a home BP cuff and monitor blood pressure. If running high she will need an appt to address and find a better alternative for her.   Thanks.

## 2016-03-04 LAB — HM DIABETES EYE EXAM

## 2016-03-07 ENCOUNTER — Ambulatory Visit: Payer: BLUE CROSS/BLUE SHIELD | Admitting: Family Medicine

## 2016-03-20 NOTE — Progress Notes (Deleted)
HPI:  Here for CPE:  -Concerns and/or follow up today:  Diabetes: -chronic -hgba1c 6.9 [id number] -Complications:  None per patient -Last eye exam: sees opthalmologist, [place] [facility] -Medications:metfromin 1000mg  bid -Lifestyle: aerobic exercise - 2 days per week, walking for 30 minutes; diet is much improved -Denies: low blood sugars, vision changes  HTN: -chronic -meds:  tapered off atenolol, norvasc 10 -hctz made her pee too much -Denies: History heart disease, chest pain, shortness of breath, swelling, headaches or vision changes  Hyperlipidemia: -chronic -Meds: Pravastatin 40 mg -LDL 81 last check 04/2015  Past medical history of asthma and anemia per list in chart. Last CBC normal 09/2015. No asthma medications on medication list she reports this was in her childhood and has not had symptoms as an adult.  -Diet: variety of foods, balance and well rounded, larger portion sizes  -Exercise: no regular exercise  -Taking folic acid, vitamin D or calcium: no  -Diabetes and Dyslipidemia Screening:  -Hx of HTN: no  -Vaccines: UTD  -pap history:  -FDLMP:  -sexual activity: yes, female partner, no new partners  -wants STI testing (Hep C if born [birth year]): no  -FH breast, colon or ovarian ca: see FH Last mammogram: Last colon cancer screening:  Breast Ca Risk Assessment: -SolutionApps.it  Genetic Counseling Screen: Http://www.breastcancergenesscreen.org/startScreen.aspx  FRAX (50-65):  DEXA (>/= 65):   -Alcohol, Tobacco, drug use: see social history  Review of Systems - no fevers, unintentional weight loss, vision loss, hearing loss, chest pain, sob, hemoptysis, melena, hematochezia, hematuria, genital discharge, changing or concerning skin lesions, bleeding, bruising, loc, thoughts of self harm or SI  Past Medical History:  Diagnosis Date  . Actinic keratosis 03/15/2012  . Anemia    resolved, related to menorrhagia remotely  .  Childhood asthma    resolved  . Diabetes mellitus without complication ([place])   . Hypertension   . 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 CESAREAN SECTION      Family History  Problem Relation Age of Onset  . Diabetes Father   . Hypertension Father   . Hypertension Mother     Social History   Social History  . Marital status: Single    Spouse name: N/A  . Number of children: N/A  . Years of education: N/A   Social History Main Topics  . Smoking status: Former Research scientist (life sciences)  . Smokeless tobacco: Not on file  . Alcohol use Yes     Comment: socially  . Drug use: No  . Sexual activity: Not Currently    Birth control/ protection: None   Other Topics Concern  . Not on file   Social History Narrative   Work or School: live in caregiver      Home Situation: lives with client several days per week      Spiritual Beliefs: catholic      Lifestyle: exercises 2 days per week; diet is pretty healthy           Current Outpatient Prescriptions:  .  amLODipine (NORVASC) 10 MG tablet, Take 1 tablet (10 mg total) by mouth daily., Disp: 90 tablet, Rfl: 0 .  atenolol (TENORMIN) 25 MG tablet, Taper according to instructions from visit., Disp: 90 tablet, Rfl: 0 .  cephALEXin (KEFLEX) 500 MG capsule, Take 1 capsule (500 mg total) by mouth 3 (three) times daily., Disp: 30 capsule, Rfl: 0 .  glucose blood (  ACCU-CHEK AVIVA) test strip, Test glucose once daily, Dx E11.9, Disp: 100 each, Rfl: 3 .  metFORMIN (GLUCOPHAGE) 1000 MG tablet, Take 1 tablet (1,000 mg total) by mouth 2 (two) times daily with a meal., Disp: 180 tablet, Rfl: 0 .  Multiple Vitamins-Minerals (ONE-A-DAY 50 PLUS PO), Take by mouth., Disp: , Rfl:  .  pravastatin (PRAVACHOL) 40 MG tablet, TAKE 1 TABLET(40 MG) BY MOUTH DAILY, Disp: 90 tablet, Rfl: 0 .  Probiotic Product (PRO-BIOTIC BLEND PO), Take 1 tablet by mouth daily. ,  Disp: , Rfl:   EXAM:  There were no vitals filed for this visit.  GENERAL: vitals reviewed and listed below, alert, oriented, appears well hydrated and in no acute distress  HEENT: head atraumatic, PERRLA, normal appearance of eyes, ears, nose and mouth. moist mucus membranes.  NECK: supple, no masses or lymphadenopathy  LUNGS: clear to auscultation bilaterally, no rales, rhonchi or wheeze  CV: HRRR, no peripheral edema or cyanosis, normal pedal pulses  BREAST: normal appearance - no lesions or discharge, on palpation normal breast tissue without any suspicious masses  ABDOMEN: bowel sounds normal, soft, non tender to palpation, no masses, no rebound or guarding  GU: normal appearance of external genitalia - no lesions or masses, normal vaginal mucosa - no abnormal discharge, normal appearance of cervix - no lesions or abnormal discharge, no masses or tenderness on palpation of uterus and ovaries.  RECTAL: refused  SKIN: no rash or abnormal lesions  MS: normal gait, moves all extremities normally  NEURO: normal gait, speech and thought processing grossly intact, muscle tone grossly intact throughout  PSYCH: normal affect, pleasant and cooperative  ASSESSMENT AND PLAN:  Discussed the following assessment and plan:  There are no diagnoses linked to this encounter.  -Discussed and advised all [place] preventive services health task force level A and B recommendations for age, sex and risks.  -Advised at least 150 minutes of exercise per week and a healthy diet with avoidance of (less then 1 serving per week) processed foods, white starches, red meat, fast foods and sweets and consisting of: * 5-9 servings of fresh fruits and vegetables (not corn or potatoes) *nuts and seeds, beans *olives and olive oil *lean meats such as fish and white chicken  *whole grains  -labs, studies and vaccines per orders this encounter  No orders of the defined types were placed in this  encounter.   Patient advised to return to clinic immediately if symptoms worsen or persist or new concerns.  There are no Patient Instructions on file for this visit.  No Follow-up on file.  Colin Benton R., DO

## 2016-03-21 ENCOUNTER — Telehealth: Payer: Self-pay | Admitting: *Deleted

## 2016-03-21 ENCOUNTER — Encounter: Payer: BLUE CROSS/BLUE SHIELD | Admitting: Family Medicine

## 2016-03-21 DIAGNOSIS — Z0289 Encounter for other administrative examinations: Secondary | ICD-10-CM

## 2016-03-21 NOTE — Telephone Encounter (Signed)
Patient was a no-show for today's appt.  Unable to leave a message at the pts home number due to no voicemail being set up yet.

## 2016-04-21 ENCOUNTER — Encounter: Payer: Self-pay | Admitting: Family Medicine

## 2016-07-08 ENCOUNTER — Telehealth: Payer: Self-pay | Admitting: Family Medicine

## 2016-07-08 NOTE — Telephone Encounter (Signed)
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 Is wanting to get something called in. She wants to know if animals can get the flu and if she's been exposed, can being around her family expose them as well? Symptoms: light headed, nausea, and a cough. Says that her has bled as well.    Nurse Assessment  Nurse: Mancel Bale, RN, Arita Miss Date/Time (Eastern Time): 07/08/2016 4:49:10 PM  Confirm and document reason for call. If symptomatic, describe symptoms. 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 Is wanting to get something called in. She wants to know if animals can get the flu and if she's been exposed, can being around her family expose them as well? Symptoms: lightheaded with standing (but not now), nausea (not now), a cough, and no way to check temperature now. Says also noticed a little blood when blowing nose.  Does the patient have any new or worsening symptoms? ---Yes  Will a triage be completed? ---Yes  Related visit to physician within the last 2 weeks? ---No  Does the PT have any chronic conditions? (i.e. diabetes, asthma, etc.) ---Yes  List chronic conditions. ---high cholesterol; diabetes; HTN  Is this a behavioral health or substance abuse call? ---No     Guidelines    Guideline Title Affirmed Question Affirmed Notes  Influenza - Seasonal Patient is HIGH RISK (e.g., age > 35 years, pregnant, HIV+, or chronic medical condition)   Dizziness - Lightheadedness [1] MODERATE dizziness (e.g., interferes with normal activities) AND [2] has NOT been evaluated by physician for this (Exception: dizziness caused by heat exposure, sudden standing, or poor fluid intake)    Final Disposition User   See Physician within 24 Hours [place], RN, [employer]    Comments  Caller states that she doesn't think she has a fever  starting to feel lightheaded again.  Spoke with office  backline and was told that they are closing at 5pm still, but will be in the office review EPIC so please place all information in chart and they will reach out to pt. Pt was also given advice to go to UC or telehealth within 24 hours if no response from office. Caller verbalized understanding.   Referrals  GO TO FACILITY UNDECIDED   Disagree/Comply: Comply    Disagree/Comply: Comply

## 2016-07-08 NOTE — Telephone Encounter (Signed)
Pt would like to see if could send her something in for the flu she has been exposed to it through her client.  Pharm:  [facility] on [address].

## 2016-07-11 ENCOUNTER — Ambulatory Visit (INDEPENDENT_AMBULATORY_CARE_PROVIDER_SITE_OTHER): Payer: Self-pay | Admitting: Family Medicine

## 2016-07-11 ENCOUNTER — Encounter: Payer: Self-pay | Admitting: Family Medicine

## 2016-07-11 VITALS — BP 110/70 | HR 102 | Temp 98.2°F | Ht 63.75 in | Wt 178.6 lb

## 2016-07-11 DIAGNOSIS — I10 Essential (primary) hypertension: Secondary | ICD-10-CM

## 2016-07-11 DIAGNOSIS — Z1211 Encounter for screening for malignant neoplasm of colon: Secondary | ICD-10-CM

## 2016-07-11 DIAGNOSIS — Z1159 Encounter for screening for other viral diseases: Secondary | ICD-10-CM

## 2016-07-11 DIAGNOSIS — E119 Type 2 diabetes mellitus without complications: Secondary | ICD-10-CM

## 2016-07-11 DIAGNOSIS — E785 Hyperlipidemia, unspecified: Secondary | ICD-10-CM

## 2016-07-11 DIAGNOSIS — D649 Anemia, unspecified: Secondary | ICD-10-CM

## 2016-07-11 DIAGNOSIS — J0100 Acute maxillary sinusitis, unspecified: Secondary | ICD-10-CM

## 2016-07-11 LAB — BASIC METABOLIC PANEL WITH GFR
BUN: 22 mg/dL (ref 7–25)
CALCIUM: 9.6 mg/dL (ref 8.6–10.4)
CO2: 27 mmol/L (ref 20–31)
CREATININE: 0.89 mg/dL (ref 0.50–0.99)
Chloride: 105 mmol/L (ref 98–110)
GFR, EST AFRICAN AMERICAN: 81 mL/min (ref >=60)
GFR, Est Non African American: 71 mL/min (ref >=60)
GLUCOSE: 107 mg/dL — AB (ref 65–99)
Potassium: 4.3 mmol/L (ref 3.5–5.3)
Sodium: 140 mmol/L (ref 135–146)

## 2016-07-11 MED ORDER — AMOXICILLIN-POT CLAVULANATE 875-125 MG PO TABS: 1.0000 | ORAL_TABLET | Freq: Two times a day (BID) | ORAL | 0 refills | Status: DC

## 2016-07-11 NOTE — Telephone Encounter (Signed)
l called the pt and she stated her client was diagnosed with the flu by her physician, type unknown and she has had a non-productive cough, diarrhea (twice) and lightheadedness for a week.  Message sent to Dr Maudie Mercury.

## 2016-07-11 NOTE — Telephone Encounter (Signed)
Spoke with pt. Coming in today for appt for eval.

## 2016-07-11 NOTE — Telephone Encounter (Signed)
Please call pt. I just received this message now (Monday at 8:30am). Please obtain details - type of exposure, was this a confirmed case of influenza? What type of influenza? Is she having any symptoms?

## 2016-07-11 NOTE — Progress Notes (Signed)
HPI:  Here for acute visit for sinus congestion.  URI: -started: 1 week ago after exposure to client with flu -symptoms:nasal congestion, sore throat, cough, diarrhea initially and had a few jt pains the first few days - now improved but persistent thick and green nasal congestion, R ear and sinus pressure, cough -denies:fever, SOB, NV, tooth pain -has tried: nothing -sick contacts/travel/risks: no reported flu, strep or tick exposure -pt declines all health maintenance measures as does not have insurance -she has not had routine care in sometime and agrees to labs but refuses vaccines, flu shot, pneumonia, cancer screening -she does not know what her blood sugars have been -reports taking medication  ROS: See pertinent positives and negatives per HPI.  Past Medical History:  Diagnosis Date  . Actinic keratosis 03/15/2012  . Anemia    resolved, related to menorrhagia remotely  . Childhood asthma    resolved  . Diabetes mellitus without complication ([place])   . Hypertension   . 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 CESAREAN SECTION      Family History  Problem Relation Age of Onset  . Diabetes Father   . Hypertension Father   . Hypertension Mother     Social History   Social History  . Marital status: Single    Spouse name: N/A  . Number of children: N/A  . Years of education: N/A   Social History Main Topics  . Smoking status: Former Research scientist (life sciences)  . Smokeless tobacco: Never Used  . Alcohol use Yes     Comment: socially  . Drug use: No  . Sexual activity: Not Currently    Birth control/ protection: None   Other Topics Concern  . None   Social History Narrative   Work or School: live in caregiver      Home Situation: lives with client several days per week      Spiritual Beliefs: catholic      Lifestyle: exercises 2 days per week; diet is pretty healthy           Current Outpatient Prescriptions:  .  amLODipine (NORVASC) 10 MG tablet, Take 1 tablet (10 mg total) by mouth daily., Disp: 90 tablet, Rfl: 0 .  atenolol (TENORMIN) 25 MG tablet, Taper according to instructions from visit., Disp: 90 tablet, Rfl: 0 .  glucose blood (ACCU-CHEK AVIVA) test strip, Test glucose once daily, Dx E11.9, Disp: 100 each, Rfl: 3 .  metFORMIN (GLUCOPHAGE) 1000 MG tablet, Take 1 tablet (1,000 mg total) by mouth 2 (two) times daily with a meal., Disp: 180 tablet, Rfl: 0 .  Multiple Vitamins-Minerals (ONE-A-DAY 50 PLUS PO), Take by mouth., Disp: , Rfl:  .  pravastatin (PRAVACHOL) 40 MG tablet, TAKE 1 TABLET(40 MG) BY MOUTH DAILY, Disp: 90 tablet, Rfl: 0 .  Probiotic Product (PRO-BIOTIC BLEND PO), Take 1 tablet by mouth daily. , Disp: , Rfl:  .  amoxicillin-clavulanate (AUGMENTIN) 875-125 MG tablet, Take 1 tablet by mouth 2 (two) times daily., Disp: 20 tablet, Rfl: 0  EXAM:  Vitals:   07/11/16 1602  BP: 110/70  Pulse: (!) 102  Temp: 98.2 F (36.8 C)    Body mass index is 30.9 kg/m.  GENERAL: vitals reviewed and listed above, alert, oriented, appears well hydrated and in no acute distress  HEENT: atraumatic, conjunttiva clear, no obvious abnormalities on inspection of external nose and ears, normal appearance  of ear canals and TMs, green nasal congestion, mild post oropharyngeal erythema with PND, no tonsillar edema or exudate, no sinus TTP  NECK: no obvious masses on inspection  LUNGS: clear to auscultation bilaterally, no wheezes, rales or rhonchi, good air movement  CV: HRRR, no peripheral edema  MS: moves all extremities without noticeable abnormality  PSYCH: pleasant and cooperative, no obvious depression or anxiety  ASSESSMENT AND PLAN:  Discussed the following assessment and plan:  Acute non-recurrent maxillary sinusitis -augmentin for sinus infection -if had flu, seems to have recovered and out of treatment window for benefit from  tamiflu -risks treatment and return precautions discussed  Essential hypertension - Plan: BMP with eGFR, CBC (no diff) Type 2 diabetes mellitus without complication, without long-term current use of insulin (HCC) - Plan: Hemoglobin A1c, Microalbumin/Creatinine Ratio, Urine Hyperlipidemia, unspecified hyperlipidemia type Need for hepatitis C screening test -labs -advised office assistant to help her with contact information for [place] [facility] [place] office -she refused all preventive care measures except hep c screening  -of course, we advised to return or notify a doctor immediately if symptoms worsen or persist or new concerns arise.    Patient Instructions  BEFORE YOU LEAVE: -labs -have shannon proved her with contact information for [place] [facility] office -follow up: 3 months, morning appointment, come fasting as we need to check cholesterol  Nasal saline for the nose, if worsening or not improving take the antibiotic. Follow up as needed.  We have ordered labs or studies at this visit. It can take up to 1-2 weeks for results and processing. IF results require follow up or explanation, we will call you with instructions. Clinically stable results will be released to your [facility]. If you have not heard from [place] or cannot find your results in [facility] in 2 weeks please contact our office at [phone].  If you are not yet signed up for [facility], please consider signing up.    We recommend the following healthy lifestyle for LIFE: 1) Small portions.   Tip: eat off of a salad plate instead of a dinner plate.  Tip: if you need more or a snack choose fruits, veggies and/or a handful of nuts or seeds.  2) Eat a healthy clean diet.  * Tip: Avoid (less then 1 serving per week): processed foods, sweets, sweetened drinks, white starches (rice, flour, bread, potatoes, pasta, etc), red meat, fast foods, butter  *Tip: CHOOSE instead   * 5-9 servings per day of fresh or frozen  fruits and vegetables (but not corn, potatoes, bananas, canned or dried fruit)   *nuts and seeds, beans   *olives and olive oil   *small portions of lean meats such as fish and white chicken    *small portions of whole grains  3)Get at least 150 minutes of sweaty aerobic exercise per week.  4)Reduce stress - consider counseling, meditation and relaxation to balance other aspects of your life.        Colin Benton R., DO

## 2016-07-11 NOTE — Telephone Encounter (Signed)
Please see TE also dated 07/08/16.

## 2016-07-11 NOTE — Progress Notes (Signed)
Pre visit review using our clinic review tool, if applicable. No additional management support is needed unless otherwise documented below in the visit note. 

## 2016-07-11 NOTE — Patient Instructions (Signed)
BEFORE YOU LEAVE: -labs -have shannon proved her with contact information for [place] [facility] -follow up: 3 months, morning appointment, come fasting as we need to check cholesterol  Nasal saline for the nose, if worsening or not improving take the antibiotic. Follow up as needed.  We have ordered labs or studies at this visit. It can take up to 1-2 weeks for results and processing. IF results require follow up or explanation, we will call you with instructions. Clinically stable results will be released to your [facility]. If you have not heard from [place] or cannot find your results in [facility] in 2 weeks please contact our office at [phone].  If you are not yet signed up for [facility], please consider signing up.    We recommend the following healthy lifestyle for LIFE: 1) Small portions.   Tip: eat off of a salad plate instead of a dinner plate.  Tip: if you need more or a snack choose fruits, veggies and/or a handful of nuts or seeds.  2) Eat a healthy clean diet.  * Tip: Avoid (less then 1 serving per week): processed foods, sweets, sweetened drinks, white starches (rice, flour, bread, potatoes, pasta, etc), red meat, fast foods, butter  *Tip: CHOOSE instead   * 5-9 servings per day of fresh or frozen fruits and vegetables (but not corn, potatoes, bananas, canned or dried fruit)   *nuts and seeds, beans   *olives and olive oil   *small portions of lean meats such as fish and white chicken    *small portions of whole grains  3)Get at least 150 minutes of sweaty aerobic exercise per week.  4)Reduce stress - consider counseling, meditation and relaxation to balance other aspects of your life.

## 2016-07-12 LAB — CBC
HCT: 35 % — ABNORMAL LOW (ref 36.0–46.0)
Hemoglobin: 11.6 g/dL — ABNORMAL LOW (ref 12.0–15.0)
MCHC: 33.1 g/dL (ref 30.0–36.0)
MCV: 78.5 fl (ref 78.0–100.0)
Platelets: 340 10*3/uL (ref 150.0–400.0)
RBC: 4.45 Mil/uL (ref 3.87–5.11)
RDW: 14.5 % (ref 11.5–15.5)
WBC: 6.8 10*3/uL (ref 4.0–10.5)

## 2016-07-12 LAB — MICROALBUMIN / CREATININE URINE RATIO
Creatinine,U: 126.6 mg/dL
MICROALB UR: 1.5 mg/dL (ref 0.0–1.9)
MICROALB/CREAT RATIO: 1.2 mg/g (ref 0.0–30.0)

## 2016-07-12 LAB — HEMOGLOBIN A1C: HEMOGLOBIN A1C: 7 % — AB (ref 4.6–6.5)

## 2016-07-14 NOTE — Addendum Note (Signed)
Addended by: Agnes Lawrence on: 07/14/2016 02:14 PM   Modules accepted: Orders

## 2016-07-26 ENCOUNTER — Telehealth: Payer: Self-pay | Admitting: *Deleted

## 2016-07-26 NOTE — Telephone Encounter (Signed)
I called the pt and left a detailed message at her cell number stating she can have the colonoscopy per the message below or do stool cards yearly which would cost $10 (per Nana) and to let me know which one she would prefer to do.   Lucretia Kern, DO  Agnes Lawrence, CMA        Please let pt know per message below from GI she can do the colonoscocpy or could do the yearly stool cards for colon cancer screening. Stool cards may be cheaper with her insurance issues. IF she prefers stool cards please sent to her and cancel colonoscopy. Thanks.   Previous Messages    ----- Message -----  From: Gatha Mayer, MD  Sent: 07/19/2016  3:22 PM  To: Lucretia Kern, DO  Subject: RE: pt with no insurance that needs colonosc*   OK I looked at her chart and screening patients just see RN at no charge.   Happy to do colonoscopy but if she wants an annual iFOBT is acceptable also (if done annually) and could be more cost effective   Glendell Docker  ----- Message -----  From: Lucretia Kern, DO  Sent: 07/17/2016  3:29 PM  To: Gatha Mayer, MD  Subject: RE: pt with no insurance that needs colonosc*   Oops, sorry...did not notice that!  Okey Dupre, DOB: [id number]  Thanks!  ----- Message -----  From: Gatha Mayer, MD  Sent: 07/15/2016  3:37 PM  To: Lucretia Kern, DO  Subject: RE: pt with no insurance that needs colonosc*   I should be able to do the direct since you have seen her but patient name not showing on any of the messages.   Thanks   Glendell Docker  ----- Message -----  From: Lucretia Kern, DO  Sent: 07/14/2016  9:42 PM  To: Gatha Mayer, MD  Subject: FW: pt with no insurance that needs colonoscGlendell Docker,  Hi. Hope you are well. This pt has no insurance. Seems she is going to set up payment plan for a colonosocpy. See note below from your office staff - I was to send you a message regarding this pt to see if you could save her an office visit to lower cost. Thanks!  Jarrett Soho  ----- Message -----    From: Agnes Lawrence, CMA  Sent: 07/14/2016  2:14 PM  To: Lucretia Kern, DO  Subject: RE: pt with no insurance that needs colonosc*   I spoke with the pt and she agreed to have the colonoscopy and the order was entered.  Sherryle Lis

## 2016-08-01 ENCOUNTER — Encounter: Payer: Self-pay | Admitting: Internal Medicine

## 2016-08-20 ENCOUNTER — Other Ambulatory Visit: Payer: Self-pay | Admitting: Family Medicine

## 2016-09-02 ENCOUNTER — Other Ambulatory Visit: Payer: Self-pay | Admitting: Family Medicine

## 2016-09-13 ENCOUNTER — Encounter: Payer: Self-pay | Admitting: Family Medicine

## 2016-09-26 ENCOUNTER — Encounter: Payer: Self-pay | Admitting: Internal Medicine

## 2016-10-08 NOTE — Progress Notes (Signed)
HPI:  Hayley Fox is a pleasant 61 y.o. here for follow up. Chronic medical problems summarized below were reviewed for changes and stability and were updated as needed below. These issues and their treatment remain stable for the most part. Reports is doing well. No new complaints. Wants to do Hep C and HIV testing with labs - denies sexual activity or needle sticks/tattoos/etc. No regular exercise. Diet ok per her report. Has not done stool cards - has them at home and agrees to complete. Denies CP, SOB, DOE, treatment intolerance or new symptoms.   Diabetes: -eye doctor: [facility] -Medications:metfromin 1000mg  bid  HTN: -chronic -meds:  norvasc 10 -hctz made her pee too much  Hyperlipidemia: -chronic -Meds: Pravastatin 40 mg  Mild anemia -she has not done the colon cancer screening  -agrees to stool cards and has them but has not completed -denies melena, hematochezia, unexplained wt loss  ROS: See pertinent positives and negatives per HPI.  Past Medical History:  Diagnosis Date  . Actinic keratosis 03/15/2012  . Anemia    resolved, related to menorrhagia remotely  . Childhood asthma    resolved  . Diabetes mellitus without complication ([place])   . Hypertension   . 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 CESAREAN SECTION      Family History  Problem Relation Age of Onset  . Diabetes Father   . Hypertension Father   . Hypertension Mother     Social History   Social History  . Marital status: Single    Spouse name: N/A  . Number of children: N/A  . Years of education: N/A   Social History Main Topics  . Smoking status: Former Research scientist (life sciences)  . Smokeless tobacco: Never Used  . Alcohol use Yes     Comment: socially  . Drug use: No  . Sexual activity: Not Currently    Birth control/ protection: None   Other Topics Concern  . None   Social History Narrative    Work or School: live in caregiver      Home Situation: lives with client several days per week      Spiritual Beliefs: catholic      Lifestyle: exercises 2 days per week; diet is pretty healthy           Current Outpatient Prescriptions:  .  amLODipine (NORVASC) 10 MG tablet, TAKE 1 TABLET BY MOUTH DAILY, Disp: 90 tablet, Rfl: 1 .  glucose blood (ACCU-CHEK AVIVA) test strip, Test glucose once daily, Dx E11.9, Disp: 100 each, Rfl: 3 .  metFORMIN (GLUCOPHAGE) 1000 MG tablet, Take 1 tablet (1,000 mg total) by mouth 2 (two) times daily with a meal., Disp: 180 tablet, Rfl: 0 .  metFORMIN (GLUCOPHAGE) 1000 MG tablet, TAKE 1 TABLET(1000 MG) BY MOUTH TWICE DAILY WITH A MEAL, Disp: 200 tablet, Rfl: 0 .  Multiple Vitamins-Minerals (ONE-A-DAY 50 PLUS PO), Take by mouth., Disp: , Rfl:  .  pravastatin (PRAVACHOL) 40 MG tablet, TAKE 1 TABLET BY MOUTH EVERY DAY, Disp: 90 tablet, Rfl: 1 .  Probiotic Product (PRO-BIOTIC BLEND PO), Take 1 tablet by mouth daily. , Disp: , Rfl:   EXAM:  Vitals:   10/10/16 0951  BP: 120/78  Pulse: 78  Temp: 98.8 F (37.1 C)    Body mass index is 30.69 kg/m.  GENERAL: vitals reviewed and listed above, alert, oriented, appears well hydrated and  in no acute distress  HEENT: atraumatic, conjunttiva clear, no obvious abnormalities on inspection of external nose and ears  NECK: no obvious masses on inspection  LUNGS: clear to auscultation bilaterally, no wheezes, rales or rhonchi, good air movement  CV: HRRR, no peripheral edema  MS: moves all extremities without noticeable abnormality  PSYCH: pleasant and cooperative, no obvious depression or anxiety  ASSESSMENT AND PLAN:  Discussed the following assessment and plan:  Essential hypertension - Plan: Basic metabolic panel, CBC  Type 2 diabetes mellitus without complication, without long-term current use of insulin (HCC) - Plan: Hemoglobin A1c  Hyperlipidemia, unspecified hyperlipidemia type - Plan:  Lipid panel  BMI 30.0-30.9,adult  Anemia, unspecified type  Need for hepatitis C screening test - Plan: Hepatitis C antibody  Encounter for screening for HIV - Plan: HIV antibody  -Patient advised to return or notify a doctor immediately if symptoms worsen or persist or new concerns arise.  Patient Instructions  BEFORE YOU LEAVE: -follow up: 4 months -labs  Please complete the stool cards as soon as possible - in the next week.  We have ordered labs or studies at this visit. It can take up to 1-2 weeks for results and processing. IF results require follow up or explanation, we will call you with instructions. Clinically stable results will be released to your [facility]. If you have not heard from [place] or cannot find your results in [facility] in 2 weeks please contact our office at [phone].  If you are not yet signed up for [facility], please consider signing up.   We recommend the following healthy lifestyle for LIFE: 1) Small portions.   Tip: eat off of a salad plate instead of a dinner plate.  Tip: It is ok to feel hungry after a meal of proper portion size.  Tip: if you need more or a snack choose fruits, veggies and/or a handful of nuts or seeds.  2) Eat a healthy clean diet.  * Tip: Avoid (less then 1 serving per week): processed foods, sweets, sweetened drinks, white starches (rice, flour, bread, potatoes, pasta, etc), red meat, fast foods, butter  *Tip: CHOOSE instead   * 5-9 servings per day of fresh or frozen fruits and vegetables (but not corn, potatoes, bananas, canned or dried fruit)   *nuts and seeds, beans   *olives and olive oil   *small portions of lean meats such as fish and white chicken    *small portions of whole grains  3)Get at least 150 minutes of sweaty aerobic exercise per week.  4)Reduce stress - consider counseling, meditation and relaxation to balance other aspects of your life.         Colin Benton R., DO

## 2016-10-10 ENCOUNTER — Ambulatory Visit (INDEPENDENT_AMBULATORY_CARE_PROVIDER_SITE_OTHER): Payer: Self-pay | Admitting: Family Medicine

## 2016-10-10 ENCOUNTER — Ambulatory Visit: Payer: Self-pay | Admitting: Family Medicine

## 2016-10-10 ENCOUNTER — Encounter: Payer: Self-pay | Admitting: Family Medicine

## 2016-10-10 VITALS — BP 120/78 | HR 78 | Temp 98.8°F | Ht 63.75 in | Wt 177.4 lb

## 2016-10-10 DIAGNOSIS — Z1159 Encounter for screening for other viral diseases: Secondary | ICD-10-CM

## 2016-10-10 DIAGNOSIS — Z114 Encounter for screening for human immunodeficiency virus [HIV]: Secondary | ICD-10-CM

## 2016-10-10 DIAGNOSIS — D649 Anemia, unspecified: Secondary | ICD-10-CM

## 2016-10-10 DIAGNOSIS — E119 Type 2 diabetes mellitus without complications: Secondary | ICD-10-CM

## 2016-10-10 DIAGNOSIS — E785 Hyperlipidemia, unspecified: Secondary | ICD-10-CM

## 2016-10-10 DIAGNOSIS — Z6830 Body mass index (BMI) 30.0-30.9, adult: Secondary | ICD-10-CM

## 2016-10-10 DIAGNOSIS — I10 Essential (primary) hypertension: Secondary | ICD-10-CM

## 2016-10-10 LAB — CBC
HEMATOCRIT: 36.4 % (ref 36.0–46.0)
Hemoglobin: 11.7 g/dL — ABNORMAL LOW (ref 12.0–15.0)
MCHC: 32.3 g/dL (ref 30.0–36.0)
MCV: 78.7 fl (ref 78.0–100.0)
Platelets: 325 10*3/uL (ref 150.0–400.0)
RBC: 4.63 Mil/uL (ref 3.87–5.11)
RDW: 15.7 % — AB (ref 11.5–15.5)
WBC: 5.7 10*3/uL (ref 4.0–10.5)

## 2016-10-10 LAB — LIPID PANEL
CHOLESTEROL: 161 mg/dL (ref 0–200)
HDL: 52.5 mg/dL (ref >39.00)
LDL CALC: 82 mg/dL (ref 0–99)
NonHDL: 108.09
Total CHOL/HDL Ratio: 3
Triglycerides: 128 mg/dL (ref 0.0–149.0)
VLDL: 25.6 mg/dL (ref 0.0–40.0)

## 2016-10-10 LAB — BASIC METABOLIC PANEL
BUN: 16 mg/dL (ref 6–23)
CHLORIDE: 104 meq/L (ref 96–112)
CO2: 29 meq/L (ref 19–32)
CREATININE: 0.69 mg/dL (ref 0.40–1.20)
Calcium: 9.5 mg/dL (ref 8.4–10.5)
GFR: 111.26 mL/min (ref >60.00)
Glucose, Bld: 109 mg/dL — ABNORMAL HIGH (ref 70–99)
Potassium: 4.5 mEq/L (ref 3.5–5.1)
Sodium: 140 mEq/L (ref 135–145)

## 2016-10-10 LAB — HEMOGLOBIN A1C: HEMOGLOBIN A1C: 7.4 % — AB (ref 4.6–6.5)

## 2016-10-10 NOTE — Progress Notes (Signed)
Pre visit review using our clinic review tool, if applicable. No additional management support is needed unless otherwise documented below in the visit note. 

## 2016-10-10 NOTE — Patient Instructions (Signed)
BEFORE YOU LEAVE: -follow up: 4 months -labs  Please complete the stool cards as soon as possible - in the next week.  We have ordered labs or studies at this visit. It can take up to 1-2 weeks for results and processing. IF results require follow up or explanation, we will call you with instructions. Clinically stable results will be released to your [facility]. If you have not heard from [place] or cannot find your results in [facility] in 2 weeks please contact our office at [phone].  If you are not yet signed up for [facility], please consider signing up.   We recommend the following healthy lifestyle for LIFE: 1) Small portions.   Tip: eat off of a salad plate instead of a dinner plate.  Tip: It is ok to feel hungry after a meal of proper portion size.  Tip: if you need more or a snack choose fruits, veggies and/or a handful of nuts or seeds.  2) Eat a healthy clean diet.  * Tip: Avoid (less then 1 serving per week): processed foods, sweets, sweetened drinks, white starches (rice, flour, bread, potatoes, pasta, etc), red meat, fast foods, butter  *Tip: CHOOSE instead   * 5-9 servings per day of fresh or frozen fruits and vegetables (but not corn, potatoes, bananas, canned or dried fruit)   *nuts and seeds, beans   *olives and olive oil   *small portions of lean meats such as fish and white chicken    *small portions of whole grains  3)Get at least 150 minutes of sweaty aerobic exercise per week.  4)Reduce stress - consider counseling, meditation and relaxation to balance other aspects of your life.

## 2016-10-11 LAB — HIV ANTIBODY (ROUTINE TESTING W REFLEX): HIV 1&2 Ab, 4th Generation: NONREACTIVE

## 2016-10-11 LAB — HEPATITIS C ANTIBODY: HCV Ab: NEGATIVE

## 2016-10-11 MED ORDER — GLIPIZIDE 5 MG PO TABS: 5.0000 mg | ORAL_TABLET | Freq: Every day | ORAL | 1 refills | Status: DC

## 2016-10-11 NOTE — Addendum Note (Signed)
Addended by: Agnes Lawrence on: 10/11/2016 05:24 PM   Modules accepted: Orders

## 2016-10-19 ENCOUNTER — Encounter: Payer: Self-pay | Admitting: Internal Medicine

## 2016-11-01 ENCOUNTER — Other Ambulatory Visit: Payer: Self-pay | Admitting: Family Medicine

## 2016-12-05 ENCOUNTER — Ambulatory Visit (AMBULATORY_SURGERY_CENTER): Payer: Self-pay | Admitting: *Deleted

## 2016-12-05 VITALS — Ht 64.0 in | Wt 184.0 lb

## 2016-12-05 DIAGNOSIS — Z1211 Encounter for screening for malignant neoplasm of colon: Secondary | ICD-10-CM

## 2016-12-05 NOTE — Progress Notes (Signed)
Patient denies any allergies to eggs or soy. Patient denies any problems with anesthesia/sedation. Patient denies any oxygen use at home and does not take any diet/weight loss medications. EMMI education assisgned to patient on colonoscopy, this was explained and instructions given to patient. No email used, emmi information given to patient. Patient denies being in any research study as per chart.

## 2016-12-06 ENCOUNTER — Other Ambulatory Visit: Payer: Self-pay | Admitting: Family Medicine

## 2016-12-18 HISTORY — PX: COLONOSCOPY: SHX174

## 2016-12-19 ENCOUNTER — Ambulatory Visit (AMBULATORY_SURGERY_CENTER): Payer: Self-pay | Admitting: Internal Medicine

## 2016-12-19 ENCOUNTER — Encounter: Payer: Self-pay | Admitting: Internal Medicine

## 2016-12-19 VITALS — BP 137/89 | HR 84 | Temp 98.4°F | Resp 14 | Ht 64.0 in | Wt 184.0 lb

## 2016-12-19 DIAGNOSIS — K6389 Other specified diseases of intestine: Secondary | ICD-10-CM

## 2016-12-19 DIAGNOSIS — D123 Benign neoplasm of transverse colon: Secondary | ICD-10-CM

## 2016-12-19 DIAGNOSIS — Z1212 Encounter for screening for malignant neoplasm of rectum: Secondary | ICD-10-CM

## 2016-12-19 DIAGNOSIS — K635 Polyp of colon: Secondary | ICD-10-CM

## 2016-12-19 DIAGNOSIS — Z1211 Encounter for screening for malignant neoplasm of colon: Secondary | ICD-10-CM

## 2016-12-19 DIAGNOSIS — D120 Benign neoplasm of cecum: Secondary | ICD-10-CM

## 2016-12-19 DIAGNOSIS — D122 Benign neoplasm of ascending colon: Secondary | ICD-10-CM

## 2016-12-19 MED ORDER — SODIUM CHLORIDE 0.9 % IV SOLN: 500.0000 mL | INTRAVENOUS | Status: DC

## 2016-12-19 NOTE — Progress Notes (Signed)
A/ox3 pleased with MAC, report to [facility]

## 2016-12-19 NOTE — Progress Notes (Signed)
Called to room to assist during endoscopic procedure.  Patient ID and intended procedure confirmed with present staff. Received instructions for my participation in the procedure from the performing physician.  

## 2016-12-19 NOTE — Op Note (Signed)
[facility] Patient Name: Hayley Fox Procedure Date: 12/19/2016 11:24 AM MRN: [id number] Endoscopist: Gatha Mayer , MD Age: 61 Referring MD:  Date of Birth: 1956-06-05 Gender: Female Account #: [id number] Procedure:                Colonoscopy Indications:              Screening for colorectal malignant neoplasm, This                            is the patient's first colonoscopy Medicines:                Propofol per Anesthesia, Monitored Anesthesia Care Procedure:                Pre-Anesthesia Assessment:                           - Prior to the procedure, a History and Physical                            was performed, and patient medications and                            allergies were reviewed. The patient's tolerance of                            previous anesthesia was also reviewed. The risks                            and benefits of the procedure and the sedation                            options and risks were discussed with the patient.                            All questions were answered, and informed consent                            was obtained. Prior Anticoagulants: The patient has                            taken no previous anticoagulant or antiplatelet                            agents. ASA Grade Assessment: II - A patient with                            mild systemic disease. After reviewing the risks                            and benefits, the patient was deemed in                            satisfactory condition to undergo the procedure.  After obtaining informed consent, the colonoscope                            was passed under direct vision. Throughout the                            procedure, the patient's blood pressure, pulse, and                            oxygen saturations were monitored continuously. The                            Colonoscope was introduced through the anus and                             advanced to the the cecum, identified by                            appendiceal orifice and ileocecal valve. The                            colonoscopy was performed without difficulty. The                            patient tolerated the procedure well. The quality                            of the bowel preparation was good. The bowel                            preparation used was Miralax. The ileocecal valve,                            appendiceal orifice, and rectum were photographed. Scope In: 11:41:05 AM Scope Out: 12:00:05 PM Scope Withdrawal Time: 0 hours 13 minutes 28 seconds  Total Procedure Duration: 0 hours 19 minutes 0 seconds  Findings:                 The perianal and digital rectal examinations were                            normal.                           A 7 mm polyp was found in the transverse colon. The                            polyp was sessile. The polyp was removed with a                            cold snare. Resection and retrieval were complete.                            Verification of patient identification for the  specimen was done. Estimated blood loss was minimal.                           Two sessile polyps were found in the ascending                            colon and cecum. The polyps were diminutive in                            size. These polyps were removed with a cold biopsy                            forceps. Resection and retrieval were complete.                            Verification of patient identification for the                            specimen was done. Estimated blood loss was minimal.                           Multiple small and large-mouthed diverticula were                            found in the sigmoid colon.                           The exam was otherwise without abnormality on                            direct and retroflexion views. Complications:            No immediate  complications. Estimated Blood Loss:     Estimated blood loss was minimal. Impression:               - One 7 mm polyp in the transverse colon, removed                            with a cold snare. Resected and retrieved.                           - Two diminutive polyps in the ascending colon and                            in the cecum, removed with a cold biopsy forceps.                            Resected and retrieved.                           - Diverticulosis in the sigmoid colon.                           - The examination was otherwise normal on direct  and retroflexion views. Recommendation:           - Patient has a contact number available for                            emergencies. The signs and symptoms of potential                            delayed complications were discussed with the                            patient. Return to normal activities tomorrow.                            Written discharge instructions were provided to the                            patient.                           - Resume previous diet.                           - Continue present medications.                           - Await pathology results.                           - Repeat colonoscopy is recommended. The                            colonoscopy date will be determined after pathology                            results from today's exam become available for                            review. Gatha Mayer, MD 12/19/2016 12:07:10 PM This report has been signed electronically.

## 2016-12-19 NOTE — Patient Instructions (Addendum)
I found and removed 3 small polyps - all look benign. I will let you know pathology results and when to have another routine colonoscopy by mail and/or My Chart.  You also have a condition called diverticulosis - common and not usually a problem. Please read the handout provided.  I appreciate the opportunity to care for you. Gatha Mayer, MD, FACG      YOU HAD AN ENDOSCOPIC PROCEDURE TODAY AT [facility]:   Refer to the procedure report that was given to you for any specific questions about what was found during the examination.  If the procedure report does not answer your questions, please call your gastroenterologist to clarify.  If you requested that your care partner not be given the details of your procedure findings, then the procedure report has been included in a sealed envelope for you to review at your convenience later.  YOU SHOULD EXPECT: Some feelings of bloating in the abdomen. Passage of more gas than usual.  Walking can help get rid of the air that was put into your GI tract during the procedure and reduce the bloating. If you had a lower endoscopy (such as a colonoscopy or flexible sigmoidoscopy) you may notice spotting of blood in your stool or on the toilet paper. If you underwent a bowel prep for your procedure, you may not have a normal bowel movement for a few days.  Please Note:  You might notice some irritation and congestion in your nose or some drainage.  This is from the oxygen used during your procedure.  There is no need for concern and it should clear up in a day or so.  SYMPTOMS TO REPORT IMMEDIATELY:   Following lower endoscopy (colonoscopy or flexible sigmoidoscopy):  Excessive amounts of blood in the stool  Significant tenderness or worsening of abdominal pains  Swelling of the abdomen that is new, acute  Fever of 100F or higher   For urgent or emergent issues, a gastroenterologist can be reached at any hour by calling  [phone].   DIET:  We do recommend a small meal at first, but then you may proceed to your regular diet.  Drink plenty of fluids but you should avoid alcoholic beverages for 24 hours.  ACTIVITY:  You should plan to take it easy for the rest of today and you should NOT DRIVE or use heavy machinery until tomorrow (because of the sedation medicines used during the test).    FOLLOW UP: Our staff will call the number listed on your records the next business day following your procedure to check on you and address any questions or concerns that you may have regarding the information given to you following your procedure. If we do not reach you, we will leave a message.  However, if you are feeling well and you are not experiencing any problems, there is no need to return our call.  We will assume that you have returned to your regular daily activities without incident.  If any biopsies were taken you will be contacted by phone or by letter within the next 1-3 weeks.  Please call us at [phone] if you have not heard about the biopsies in 3 weeks.    SIGNATURES/CONFIDENTIALITY: You and/or your care partner have signed paperwork which will be entered into your electronic medical record.  These signatures attest to the fact that that the information above on your After Visit Summary has been reviewed and is understood.  Full  responsibility of the confidentiality of this discharge information lies with you and/or your care-partner.   Resume medications. Information given on polyps and diverticulosis.

## 2016-12-19 NOTE — Progress Notes (Signed)
Pt's states no medical or surgical changes since previsit or office visit. 

## 2016-12-20 ENCOUNTER — Telehealth: Payer: Self-pay | Admitting: *Deleted

## 2016-12-20 NOTE — Telephone Encounter (Signed)
  Follow up Call-  Call back number 12/19/2016  Post procedure Call Back phone  # [phone]  Permission to leave phone message Yes  Some recent data might be hidden     Patient questions:  Do you have a fever, pain , or abdominal swelling? No. Pain Score  0 *  Have you tolerated food without any problems? Yes.    Have you been able to return to your normal activities? Yes.    Do you have any questions about your discharge instructions: Diet   No. Medications  No. Follow up visit  No.  Do you have questions or concerns about your Care? No.  Actions: * If pain score is 4 or above: No action needed, pain <4.

## 2016-12-22 ENCOUNTER — Encounter: Payer: Self-pay | Admitting: Internal Medicine

## 2016-12-22 NOTE — Progress Notes (Signed)
Benign lymphoid polyps Repeat colon 2028

## 2017-02-04 NOTE — Progress Notes (Deleted)
HPI:  Hayley Fox is a pleasant 60 y.o. here for follow up. Chronic medical problems summarized below were reviewed for changes. ***. Denies CP, SOB, DOE, treatment intolerance or new symptoms. Due for labs: cbc, bmp, hgba1c  Diabetes: -eye doctor: [facility] -Medications:metfromin 1000mg  bid  HTN: -chronic -meds: norvasc 10 -hctz made her pee too much  Hyperlipidemia: -chronic -Meds: Pravastatin 40 mg  Mild anemia -had colonoscopy 2018 with polyps, removed, not cancer - repeat advised in 10 years -denies melena, hematochezia, unexplained wt loss   ROS: See pertinent positives and negatives per HPI.  Past Medical History:  Diagnosis Date  . Actinic keratosis 03/15/2012  . Anemia   . Blood transfusion without reported diagnosis   . Childhood asthma    resolved  . Diabetes mellitus without complication ([place])   . Hyperlipidemia   . Hypertension   . 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 CESAREAN SECTION    . COLONOSCOPY  12/2016   negative screening    Family History  Problem Relation Age of Onset  . Diabetes Father   . Hypertension Father   . Hypertension Mother   . Colon cancer Neg Hx     Social History   Social History  . Marital status: Single    Spouse name: N/A  . Number of children: N/A  . Years of education: N/A   Social History Main Topics  . Smoking status: Former Research scientist (life sciences)  . Smokeless tobacco: Never Used  . Alcohol use Yes     Comment: socially-once a month per pt.  . Drug use: No  . Sexual activity: Not Currently    Birth control/ protection: None   Other Topics Concern  . Not on file   Social History Narrative   Work or School: live in caregiver      Home Situation: lives with client several days per week      Spiritual Beliefs: catholic      Lifestyle: exercises 2 days per week; diet is pretty healthy            Current Outpatient Prescriptions:  .  amLODipine (NORVASC) 10 MG tablet, TAKE 1 TABLET BY MOUTH DAILY, Disp: 90 tablet, Rfl: 1 .  glipiZIDE (GLUCOTROL) 5 MG tablet, Take 1 tablet (5 mg total) by mouth daily before breakfast., Disp: 90 tablet, Rfl: 1 .  glucose blood (ACCU-CHEK AVIVA) test strip, Test glucose once daily, Dx E11.9, Disp: 100 each, Rfl: 3 .  loratadine (CLARITIN) 5 MG chewable tablet, Chew 10 mg by mouth daily., Disp: , Rfl:  .  metFORMIN (GLUCOPHAGE) 1000 MG tablet, TAKE 1 TABLET BY MOUTH TWICE DAILY WITH MEALS, Disp: 180 tablet, Rfl: 0 .  Multiple Vitamins-Minerals (ONE-A-DAY 50 PLUS PO), Take by mouth., Disp: , Rfl:  .  pravastatin (PRAVACHOL) 40 MG tablet, TAKE 1 TABLET BY MOUTH EVERY DAY, Disp: 90 tablet, Rfl: 1 .  Probiotic Product (PRO-BIOTIC BLEND PO), Take 1 tablet by mouth daily. , Disp: , Rfl:   Current Facility-Administered Medications:  .  0.9 %  sodium chloride infusion, 500 mL, Intravenous, Continuous, Carlean Purl Ofilia Neas, MD  EXAM:  There were no vitals filed for this visit.  There is no height or weight on file to calculate BMI.  GENERAL: vitals reviewed and listed above, alert, oriented, appears well hydrated and in no acute distress  HEENT: atraumatic, conjunttiva clear, no obvious  abnormalities on inspection of external nose and ears  NECK: no obvious masses on inspection  LUNGS: clear to auscultation bilaterally, no wheezes, rales or rhonchi, good air movement  CV: HRRR, no peripheral edema  MS: moves all extremities without noticeable abnormality  PSYCH: pleasant and cooperative, no obvious depression or anxiety  ASSESSMENT AND PLAN:  Discussed the following assessment and plan:  No diagnosis found.  -Patient advised to return or notify a doctor immediately if symptoms worsen or persist or new concerns arise.  There are no Patient Instructions on file for this visit.  Colin Benton R., DO

## 2017-02-06 ENCOUNTER — Ambulatory Visit: Payer: Self-pay | Admitting: Family Medicine

## 2017-03-14 LAB — HM DIABETES EYE EXAM

## 2017-03-15 ENCOUNTER — Other Ambulatory Visit: Payer: Self-pay | Admitting: Family Medicine

## 2017-03-29 ENCOUNTER — Other Ambulatory Visit: Payer: Self-pay | Admitting: Family Medicine

## 2017-04-10 ENCOUNTER — Other Ambulatory Visit: Payer: Self-pay | Admitting: Family Medicine

## 2017-04-17 ENCOUNTER — Other Ambulatory Visit: Payer: Self-pay | Admitting: Family Medicine

## 2017-04-17 DIAGNOSIS — Z139 Encounter for screening, unspecified: Secondary | ICD-10-CM

## 2017-04-21 ENCOUNTER — Ambulatory Visit: Payer: Self-pay | Admitting: Family Medicine

## 2017-04-21 ENCOUNTER — Encounter: Payer: Self-pay | Admitting: Family Medicine

## 2017-04-21 ENCOUNTER — Ambulatory Visit (INDEPENDENT_AMBULATORY_CARE_PROVIDER_SITE_OTHER): Payer: Self-pay | Admitting: Family Medicine

## 2017-04-21 VITALS — BP 124/72 | HR 98 | Temp 98.2°F | Ht 64.0 in | Wt 189.0 lb

## 2017-04-21 DIAGNOSIS — E119 Type 2 diabetes mellitus without complications: Secondary | ICD-10-CM

## 2017-04-21 DIAGNOSIS — E785 Hyperlipidemia, unspecified: Secondary | ICD-10-CM

## 2017-04-21 DIAGNOSIS — Z6832 Body mass index (BMI) 32.0-32.9, adult: Secondary | ICD-10-CM

## 2017-04-21 DIAGNOSIS — I10 Essential (primary) hypertension: Secondary | ICD-10-CM

## 2017-04-21 NOTE — Progress Notes (Signed)
HPI:  Acute visit for multiple issues. She decreased her metformin to once a day-felt like it upset her stomach. She wants a pill for weight loss. She admits to very poor diet with lots of candy and junk food and doesn't get any regular exercise. 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 The person she cares for does not eat healthy. She gets a charley horse in her leg at night sometimes. She has had increased urination for about a year. No fever, malaise, weakness, numbness, paresthesias in the leg, back pain, hematuria, dysuria.  ROS: See pertinent positives and negatives per HPI.  Past Medical History:  Diagnosis Date  . Actinic keratosis 03/15/2012  . Anemia   . Blood transfusion without reported diagnosis   . Childhood asthma    resolved  . Diabetes mellitus without complication ([place])   . Hyperlipidemia   . Hypertension   . 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 CESAREAN SECTION    . COLONOSCOPY  12/2016   negative screening    Family History  Problem Relation Age of Onset  . Diabetes Father   . Hypertension Father   . Hypertension Mother   . Colon cancer Neg Hx     Social History   Social History  . Marital status: Single    Spouse name: N/A  . Number of children: N/A  . Years of education: N/A   Social History Main Topics  . Smoking status: Former Research scientist (life sciences)  . Smokeless tobacco: Never Used  . Alcohol use Yes     Comment: socially-once a month per pt.  . Drug use: No  . Sexual activity: Not Currently    Birth control/ protection: None   Other Topics Concern  . None   Social History Narrative   Work or School: live in caregiver      Home Situation: lives with client several days per week      Spiritual Beliefs: catholic      Lifestyle: exercises 2 days per week; diet is pretty healthy           Current Outpatient Prescriptions:   .  amLODipine (NORVASC) 10 MG tablet, TAKE 1 TABLET BY MOUTH DAILY, Disp: 90 tablet, Rfl: 1 .  glipiZIDE (GLUCOTROL) 5 MG tablet, TAKE 1 TABLET(5 MG) BY MOUTH DAILY BEFORE BREAKFAST, Disp: 30 tablet, Rfl: 0 .  glucose blood (ACCU-CHEK AVIVA) test strip, Test glucose once daily, Dx E11.9, Disp: 100 each, Rfl: 3 .  loratadine (CLARITIN) 5 MG chewable tablet, Chew 10 mg by mouth daily., Disp: , Rfl:  .  metFORMIN (GLUCOPHAGE) 1000 MG tablet, TAKE 1 TABLET BY MOUTH TWICE DAILY WITH MEALS (Patient taking differently: TAKE 1 TABLET BY MOUTH ONCE A DAY), Disp: 180 tablet, Rfl: 0 .  Multiple Vitamins-Minerals (ONE-A-DAY 50 PLUS PO), Take by mouth., Disp: , Rfl:  .  pravastatin (PRAVACHOL) 40 MG tablet, TAKE 1 TABLET BY MOUTH EVERY DAY, Disp: 90 tablet, Rfl: 0 .  Probiotic Product (PRO-BIOTIC BLEND PO), Take 1 tablet by mouth daily. , Disp: , Rfl:   Current Facility-Administered Medications:  .  0.9 %  sodium chloride infusion, 500 mL, Intravenous, Continuous, Gatha Mayer, MD  EXAM:  Vitals:   04/21/17 1505  BP: 124/72  Pulse: 98  Temp: 98.2 F (36.8 C)    Body mass index is 32.44 kg/m.  GENERAL: vitals reviewed and listed above, alert, oriented, appears well hydrated and in no acute distress  HEENT: atraumatic, conjunttiva clear, no obvious abnormalities on inspection of external nose and ears  NECK: no obvious masses on inspection  LUNGS: clear to auscultation bilaterally, no wheezes, rales or rhonchi, good air movement  CV: HRRR, no peripheral edema  MS: moves all extremities without noticeable abnormality  PSYCH: pleasant and cooperative, no obvious depression or anxiety  ASSESSMENT AND PLAN:  Discussed the following assessment and plan:  Type 2 diabetes mellitus without complication, without long-term current use of insulin (HCC) - Plan: Hemoglobin A1c, CANCELED: Hemoglobin A1c  Hyperlipidemia, unspecified hyperlipidemia type  Essential hypertension - Plan: Basic  metabolic panel, CBC with Differential/Platelet, CANCELED: Basic metabolic panel, CANCELED: CBC  BMI 32.0-32.9,adult - Plan: CBC with Differential/Platelet  -Labs per orders, may need to try long-acting metformin to see if she tolerates this better -We discussed various options for weight management and risk and benefits - she has opted to give a healthier diet and some regular exercise a try. We discussed a few simple goals to try to improve her snacking habits and increase her activity. Also advised trying to stick to only water as a beverage. -Follow up in 3 months -She refused a flu shot. -Patient advised to return or notify a doctor immediately if symptoms worsen or persist or new concerns arise.  Patient Instructions  BEFORE YOU LEAVE: -Labs -follow up: 3 months  Each week, pick up a bag of carrots, celery and humus or fat-free ranch dressing for dipping. Keep this ready to snack on instead of eating candy bars.  Drink only water.  Come up with a simple 10 minute workout you can do anywhere. Do this once daily. Example: Jog in place for 5 minutes, 100 arm raises or jumping jacks, 20 crunches or 50 twists, repeat.   Can try tonic water at night for the leg cramps.   We recommend the following healthy lifestyle for LIFE: 1) Small portions.   Tip: eat off of a salad plate instead of a dinner plate.  Tip: It is ok to feel hungry after a meal - that likely means you ate an appropriate portion.  Tip: if you need more or a snack choose fruits, veggies and/or a handful of nuts or seeds.  2) Eat a healthy clean diet.   TRY TO EAT: -at least 5-7 servings of low sugar vegetables per day (not corn, potatoes or bananas.) -berries are the best choice if you wish to eat fruit.   -lean meets (fish, chicken or [place] breasts) -vegan proteins for some meals - beans or tofu, whole grains, nuts and seeds -Replace bad fats with good fats - good fats include: fish, nuts and seeds, canola oil,  olive oil -small amounts of low fat or non fat dairy -small amounts of100 % whole grains - check the lables  AVOID: -SUGAR, sweets, anything with added sugar, corn syrup or sweeteners -if you must have a sweetener, small amounts of stevia may be best -sweetened beverages -simple starches (rice, bread, potatoes, pasta, chips, etc - small amounts of 100% whole grains are ok) -red meat, pork, butter -fried foods, fast food, processed food, excessive dairy, eggs and coconut.  3)Get at least 150 minutes of sweaty aerobic exercise per week.  4)Reduce stress - consider counseling, meditation and relaxation to balance other aspects of your life.      Colin Benton R., DO

## 2017-04-21 NOTE — Patient Instructions (Addendum)
BEFORE YOU LEAVE: -Labs -follow up: 3 months  Each week, pick up a bag of carrots, celery and humus or fat-free ranch dressing for dipping. Keep this ready to snack on instead of eating candy bars.  Drink only water.  Come up with a simple 10 minute workout you can do anywhere. Do this once daily. Example: Jog in place for 5 minutes, 100 arm raises or jumping jacks, 20 crunches or 50 twists, repeat.   Can try tonic water at night for the leg cramps.   We recommend the following healthy lifestyle for LIFE: 1) Small portions.   Tip: eat off of a salad plate instead of a dinner plate.  Tip: It is ok to feel hungry after a meal - that likely means you ate an appropriate portion.  Tip: if you need more or a snack choose fruits, veggies and/or a handful of nuts or seeds.  2) Eat a healthy clean diet.   TRY TO EAT: -at least 5-7 servings of low sugar vegetables per day (not corn, potatoes or bananas.) -berries are the best choice if you wish to eat fruit.   -lean meets (fish, chicken or [place] breasts) -vegan proteins for some meals - beans or tofu, whole grains, nuts and seeds -Replace bad fats with good fats - good fats include: fish, nuts and seeds, canola oil, olive oil -small amounts of low fat or non fat dairy -small amounts of100 % whole grains - check the lables  AVOID: -SUGAR, sweets, anything with added sugar, corn syrup or sweeteners -if you must have a sweetener, small amounts of stevia may be best -sweetened beverages -simple starches (rice, bread, potatoes, pasta, chips, etc - small amounts of 100% whole grains are ok) -red meat, pork, butter -fried foods, fast food, processed food, excessive dairy, eggs and coconut.  3)Get at least 150 minutes of sweaty aerobic exercise per week.  4)Reduce stress - consider counseling, meditation and relaxation to balance other aspects of your life.

## 2017-04-22 LAB — HEMOGLOBIN A1C
HEMOGLOBIN A1C: 8.3 %{Hb} — AB (ref <5.7)
Mean Plasma Glucose: 192 (calc)
eAG (mmol/L): 10.6 (calc)

## 2017-04-22 LAB — CBC WITH DIFFERENTIAL/PLATELET
BASOS PCT: 2 %
Basophils Absolute: 132 cells/uL (ref 0–200)
EOS PCT: 2.9 %
Eosinophils Absolute: 191 cells/uL (ref 15–500)
HEMATOCRIT: 35.2 % (ref 35.0–45.0)
HEMOGLOBIN: 11.5 g/dL — AB (ref 11.7–15.5)
Lymphs Abs: 2191 cells/uL (ref 850–3900)
MCH: 25.6 pg — ABNORMAL LOW (ref 27.0–33.0)
MCHC: 32.7 g/dL (ref 32.0–36.0)
MCV: 78.2 fL — AB (ref 80.0–100.0)
MONOS PCT: 10.2 %
MPV: 11.3 fL (ref 7.5–12.5)
NEUTROS ABS: 3412 {cells}/uL (ref 1500–7800)
Neutrophils Relative %: 51.7 %
PLATELETS: 334 10*3/uL (ref 140–400)
RBC: 4.5 10*6/uL (ref 3.80–5.10)
RDW: 14.3 % (ref 11.0–15.0)
Total Lymphocyte: 33.2 %
WBC mixed population: 673 cells/uL (ref 200–950)
WBC: 6.6 10*3/uL (ref 3.8–10.8)

## 2017-04-22 LAB — BASIC METABOLIC PANEL
BUN / CREAT RATIO: 15 (calc) (ref 6–22)
BUN: 21 mg/dL (ref 7–25)
CALCIUM: 9.6 mg/dL (ref 8.6–10.4)
CO2: 26 mmol/L (ref 20–32)
Chloride: 102 mmol/L (ref 98–110)
Creat: 1.39 mg/dL — ABNORMAL HIGH (ref 0.50–0.99)
GLUCOSE: 182 mg/dL — AB (ref 65–99)
Potassium: 4.3 mmol/L (ref 3.5–5.3)
Sodium: 140 mmol/L (ref 135–146)

## 2017-04-25 ENCOUNTER — Encounter: Payer: Self-pay | Admitting: Family Medicine

## 2017-04-25 ENCOUNTER — Ambulatory Visit (INDEPENDENT_AMBULATORY_CARE_PROVIDER_SITE_OTHER): Payer: Self-pay | Admitting: Family Medicine

## 2017-04-25 VITALS — BP 122/82 | HR 103 | Temp 98.7°F | Ht 64.0 in

## 2017-04-25 DIAGNOSIS — Z6832 Body mass index (BMI) 32.0-32.9, adult: Secondary | ICD-10-CM

## 2017-04-25 DIAGNOSIS — E119 Type 2 diabetes mellitus without complications: Secondary | ICD-10-CM

## 2017-04-25 DIAGNOSIS — N289 Disorder of kidney and ureter, unspecified: Secondary | ICD-10-CM

## 2017-04-25 MED ORDER — GLIPIZIDE 5 MG PO TABS: 5.0000 mg | ORAL_TABLET | Freq: Two times a day (BID) | ORAL | 3 refills | Status: DC

## 2017-04-25 NOTE — Progress Notes (Signed)
HPI:   Acute visit to address worsening diabetes: -recent Hgba1c 8.3 -renal function worsened - creatinine 1.39 -meds: metformin 1000mg  daily, glipizide 5mg  daily -reports:no low blood sugars, has had more fatigue and more frequent urination for several months, was taking OTC cough and cold meds at time of labs, poor diet and no regular exercise -wants to mainly treat her diabetes with lifestyle changes rather then adding medications, reports she will be a different person at her next visit as this has scared her and she plans to make some changes -She doesn't have insurance, so cost is an issue with her medications   ROS: See pertinent positives and negatives per HPI.  Past Medical History:  Diagnosis Date  . Actinic keratosis 03/15/2012  . Anemia   . Blood transfusion without reported diagnosis   . Childhood asthma    resolved  . Diabetes mellitus without complication ([place])   . Hyperlipidemia   . Hypertension   . 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 CESAREAN SECTION    . COLONOSCOPY  12/2016   negative screening    Family History  Problem Relation Age of Onset  . Diabetes Father   . Hypertension Father   . Hypertension Mother   . Colon cancer Neg Hx     Social History   Socioeconomic History  . Marital status: Single    Spouse name: None  . Number of children: None  . Years of education: None  . Highest education level: None  Social Needs  . Financial resource strain: None  . Food insecurity - worry: None  . Food insecurity - inability: None  . Transportation needs - medical: None  . Transportation needs - non-medical: None  Occupational History  . None  Tobacco Use  . Smoking status: Former Research scientist (life sciences)  . Smokeless tobacco: Never Used  Substance and Sexual Activity  . Alcohol use: Yes    Comment: socially-once a month per pt.  . Drug use: No  . Sexual  activity: Not Currently    Birth control/protection: None  Other Topics Concern  . 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  amLODipine (NORVASC) 10 MG tablet, TAKE 1 TABLET BY MOUTH DAILY, Disp: 90 tablet, Rfl: 1 .  glipiZIDE (GLUCOTROL) 5 MG tablet, TAKE 1 TABLET(5 MG) BY MOUTH DAILY BEFORE BREAKFAST, Disp: 30 tablet, Rfl: 0 .  glucose blood (ACCU-CHEK AVIVA) test strip, Test glucose once daily, Dx E11.9, Disp: 100 each, Rfl: 3 .  loratadine (CLARITIN) 5 MG chewable tablet, Chew 10 mg by mouth daily., Disp: , Rfl:  .  metFORMIN (GLUCOPHAGE) 1000 MG tablet, TAKE 1 TABLET BY MOUTH TWICE DAILY WITH MEALS (Patient taking differently: TAKE 1 TABLET BY MOUTH ONCE A DAY), Disp: 180 tablet, Rfl: 0 .  Multiple Vitamins-Minerals (ONE-A-DAY 50 PLUS PO), Take by mouth., Disp: , Rfl:  .  pravastatin (PRAVACHOL) 40 MG tablet, TAKE 1 TABLET BY MOUTH EVERY DAY, Disp: 90 tablet, Rfl: 0 .  Probiotic Product (PRO-BIOTIC BLEND PO), Take 1 tablet by mouth daily. , Disp: , Rfl:   Current  Facility-Administered Medications:  .  0.9 %  sodium chloride infusion, 500 mL, Intravenous, Continuous, Gatha Mayer, MD  EXAM:  Vitals:   04/25/17 1427  BP: 122/82  Pulse: (!) 103  Temp: 98.7 F (37.1 C)    Body mass index is 32.44 kg/m.  GENERAL: vitals reviewed and listed above, alert, oriented, appears well hydrated and in no acute distress  HEENT: atraumatic, conjunttiva clear, no obvious abnormalities on inspection of external nose and ears  NECK: no obvious masses on inspection  LUNGS: clear to auscultation bilaterally, no wheezes, rales or rhonchi, good air movement  CV: HRRR, no peripheral edema  MS: moves all extremities without noticeable abnormality  PSYCH:  pleasant and cooperative, no obvious depression or anxiety  ASSESSMENT AND PLAN:  Discussed the following assessment and plan:  Diabetes mellitus without complication ([place]) - Plan: Basic metabolic panel  Kidney dysfunction  BMI 32.0-32.9,adult  -discussed various treatment options -advised most important would be lifelong commitment to a healthy lifestyle - discussed at length -discussed treatment options/risks for diabetes in light of renal dysfunction, another concern for her is cost - she opted to increase glipizide to twice a day and work on lifestyle changes -Consider Actos if not improving -She absolutely does not want to take injectables -recheck renal function hydrated to ensure not a fluke or the cough and cold medications given sig change without good reason -Reports she had her eye exam last month, we'll have assistant obtain an abstract  -Patient advised to return or notify a doctor immediately if symptoms worsen or persist or new concerns arise.  Patient Instructions  BEFORE YOU LEAVE: Wendie Simmer, obtain and/or abstract eye exam  -lab -follow up: 3 months  Increase glipizide to twice daily with meals.  Eat a healthy low sugar diet.  Start doing regular core exercises and gradual increase regular aerobic exercise    We recommend the following healthy lifestyle for LIFE: 1) Small portions.   Tip: eat off of a salad plate instead of a dinner plate.  Tip: It is ok to feel hungry after a meal - that likely means you ate an appropriate portion.  Tip: if you need more or a snack choose fruits, veggies and/or a handful of nuts or seeds.  2) Eat a healthy clean diet.   TRY TO EAT: -at least 5-7 servings of low sugar vegetables per day (not corn, potatoes or bananas.) -berries are the best choice if you wish to eat fruit.   -lean meets (fish, chicken or [place] breasts) -vegan proteins for some meals - beans or tofu, whole grains, nuts and seeds -Replace bad fats with  good fats - good fats include: fish, nuts and seeds, canola oil, olive oil -small amounts of low fat or non fat dairy -small amounts of100 % whole grains - check the lables  AVOID: -SUGAR, sweets, anything with added sugar, corn syrup or sweeteners -if you must have a sweetener, small amounts of stevia may be best -sweetened beverages -simple starches (rice, bread, potatoes, pasta, chips, etc - small amounts of 100% whole grains are ok) -red meat, pork, butter -fried foods, fast food, processed food, excessive dairy, eggs and coconut.  3)Get at least 150 minutes of sweaty aerobic exercise per week.  4)Reduce stress - consider counseling, meditation and relaxation to balance other aspects of your life.    Colin Benton R., DO

## 2017-04-25 NOTE — Patient Instructions (Addendum)
BEFORE YOU LEAVE: Wendie Simmer, obtain and/or abstract eye exam  -lab -follow up: 3 months  Increase glipizide to twice daily with meals.  Eat a healthy low sugar diet.  Start doing regular core exercises and gradual increase regular aerobic exercise    We recommend the following healthy lifestyle for LIFE: 1) Small portions.   Tip: eat off of a salad plate instead of a dinner plate.  Tip: It is ok to feel hungry after a meal - that likely means you ate an appropriate portion.  Tip: if you need more or a snack choose fruits, veggies and/or a handful of nuts or seeds.  2) Eat a healthy clean diet.   TRY TO EAT: -at least 5-7 servings of low sugar vegetables per day (not corn, potatoes or bananas.) -berries are the best choice if you wish to eat fruit.   -lean meets (fish, chicken or [place] breasts) -vegan proteins for some meals - beans or tofu, whole grains, nuts and seeds -Replace bad fats with good fats - good fats include: fish, nuts and seeds, canola oil, olive oil -small amounts of low fat or non fat dairy -small amounts of100 % whole grains - check the lables  AVOID: -SUGAR, sweets, anything with added sugar, corn syrup or sweeteners -if you must have a sweetener, small amounts of stevia may be best -sweetened beverages -simple starches (rice, bread, potatoes, pasta, chips, etc - small amounts of 100% whole grains are ok) -red meat, pork, butter -fried foods, fast food, processed food, excessive dairy, eggs and coconut.  3)Get at least 150 minutes of sweaty aerobic exercise per week.  4)Reduce stress - consider counseling, meditation and relaxation to balance other aspects of your life.

## 2017-04-26 LAB — BASIC METABOLIC PANEL
BUN: 27 mg/dL — AB (ref 6–23)
CHLORIDE: 103 meq/L (ref 96–112)
CO2: 27 mEq/L (ref 19–32)
Calcium: 9.6 mg/dL (ref 8.4–10.5)
Creatinine, Ser: 0.76 mg/dL (ref 0.40–1.20)
GFR: 99.34 mL/min (ref >60.00)
GLUCOSE: 187 mg/dL — AB (ref 70–99)
POTASSIUM: 4.3 meq/L (ref 3.5–5.1)
SODIUM: 138 meq/L (ref 135–145)

## 2017-04-28 ENCOUNTER — Other Ambulatory Visit: Payer: Self-pay | Admitting: Family Medicine

## 2017-05-08 ENCOUNTER — Encounter (HOSPITAL_COMMUNITY): Payer: Self-pay

## 2017-05-16 ENCOUNTER — Ambulatory Visit
Admission: RE | Admit: 2017-05-16 | Discharge: 2017-05-16 | Disposition: A | Discharge status: Home / Self Care | Payer: No Typology Code available for payment source | Source: Ambulatory Visit | Attending: Family Medicine | Admitting: Family Medicine

## 2017-05-16 DIAGNOSIS — Z139 Encounter for screening, unspecified: Secondary | ICD-10-CM

## 2017-05-19 ENCOUNTER — Ambulatory Visit: Payer: Self-pay

## 2017-05-19 ENCOUNTER — Encounter: Payer: Self-pay | Admitting: Family Medicine

## 2017-05-19 ENCOUNTER — Telehealth: Payer: Self-pay | Admitting: Family Medicine

## 2017-05-19 ENCOUNTER — Ambulatory Visit (INDEPENDENT_AMBULATORY_CARE_PROVIDER_SITE_OTHER): Payer: Self-pay | Admitting: Family Medicine

## 2017-05-19 VITALS — BP 116/82 | HR 99 | Temp 98.8°F | Ht 64.0 in | Wt 183.3 lb

## 2017-05-19 DIAGNOSIS — R05 Cough: Secondary | ICD-10-CM

## 2017-05-19 DIAGNOSIS — R0982 Postnasal drip: Secondary | ICD-10-CM

## 2017-05-19 DIAGNOSIS — R053 Chronic cough: Secondary | ICD-10-CM

## 2017-05-19 DIAGNOSIS — J309 Allergic rhinitis, unspecified: Secondary | ICD-10-CM

## 2017-05-19 MED ORDER — FLUTICASONE PROPIONATE 50 MCG/ACT NA SUSP: 2.0000 | Freq: Every day | NASAL | 6 refills | Status: DC

## 2017-05-19 MED ORDER — BENZONATATE 100 MG PO CAPS: 100.0000 mg | ORAL_CAPSULE | Freq: Three times a day (TID) | ORAL | 0 refills | Status: DC | PRN

## 2017-05-19 NOTE — Patient Instructions (Signed)
BEFORE YOU LEAVE: -xray sheet -follow up:  1 month  Go get the x-ray on Monday.  Start an allergy pill such as Claritin once daily.  Also start Flonase 2 sprays each nostril daily for 1 month.  Use the Tessalon as needed for the cough per the instructions.  Follow-up sooner if worsening or new concerns.

## 2017-05-19 NOTE — Telephone Encounter (Signed)
Pt has been light headed and thinks she has more than a cold and would like an antibiotic called in as well

## 2017-05-19 NOTE — Telephone Encounter (Signed)
Pt. called with initial complaint of dizziness.  Stated she had an episode of dizziness this morning approx. 45 min., after taking Delsym cough syrup.  Reported the dizziness only lasted about 30 seconds.  Denied any further episodes of dizziness.  Pt. emphasized she has had a cough x 2-3 mos.  Reported it started with a cold, but the cough has continued, and is not improving.  Described the cough is "dry and hacky."  Reported multiple OTC cough syrups that she has tried.  Today, c/o onset of nasal stuffiness.  Denied fever. Stated she is concerned about taking all these OTC medications for the cough, and the adverse effects they could be causing.  Per protocol, appt. Made for pt. Today with [facility].  Care advice per protocol.  Verb. Understanding. Agreed with plan.                Reason for Disposition . [1] MILD dizziness (e.g., walking normally) AND [2] has NOT been evaluated by physician for this  (Exception: dizziness caused by heat exposure, sudden standing, or poor fluid intake) . Cough has been present for > 3 weeks  Answer Assessment - Initial Assessment Questions 1. DESCRIPTION: "Describe your dizziness."     Feel lightheaded  2. LIGHTHEADED: "Do you feel lightheaded?" (e.g., somewhat faint, woozy, weak upon standing)     Woozy and slightly off balance. 3. VERTIGO: "Do you feel like either you or the room is spinning or tilting?" (i.e. vertigo)     Denied vertigo symptoms 4. SEVERITY: "How bad is it?"  "Do you feel like you are going to faint?" "Can you stand and walk?"   - MILD - walking normally   - MODERATE - interferes with normal activities (e.g., work, school)    - SEVERE - unable to stand, requires support to walk, feels like passing out now.      mild 5. ONSET:  "When did the dizziness begin?"     Today after taking cough medication 6. AGGRAVATING FACTORS: "Does anything make it worse?" (e.g., standing, change in head position)     No aggravating factors 7. HEART RATE:  "Can you tell me your heart rate?" "How many beats in 15 seconds?"  (Note: not all patients can do this)       88 with skipped  beats 8. CAUSE: "What do you think is causing the dizziness?"     Dizziness caused by taking cough medication; started approx. 30-45 min. after medication.  9. RECURRENT SYMPTOM: "Have you had dizziness before?" If so, ask: "When was the last time?" "What happened that time?"    no 10. OTHER SYMPTOMS: "Do you have any other symptoms?" (e.g., fever, chest pain, vomiting, diarrhea, bleeding)      Continued cough for 2- 3 months ; nonproductive; denied chest heaviness; c/o being very tired;  11. PREGNANCY: "Is there any chance you are pregnant?" "When was your last menstrual period?"      No  Answer Assessment - Initial Assessment Questions 1. ONSET: "When did the cough begin?"      2-3 mos. Ago with a cold 2. SEVERITY: "How bad is the cough today?"      Dry, hacky cough; freq. during day  3. RESPIRATORY DISTRESS: "Describe your breathing."      Denied shortness of breath 4. FEVER: "Do you have a fever?" If so, ask: "What is your temperature, how was it measured, and when did it start?"     no 5. HEMOPTYSIS: "Are you  coughing up any blood?" If so ask: "How much?" (flecks, streaks, tablespoons, etc.)     no 6. TREATMENT: "What have you done so far to treat the cough?" (e.g., meds, fluids, humidifier)     Delsym cough medication; Alka Selzer Plus cold and flu formula, Mucinex, Day Quil cough syrup  7. CARDIAC HISTORY: "Do you have any history of heart disease?" (e.g., heart attack, congestive heart failure)      no 8. LUNG HISTORY: "Do you have any history of lung disease?"  (e.g., pulmonary embolus, asthma, emphysema)     Asthma as a child 34. PE RISK FACTORS: "Do you have a history of blood clots?" (or: recent major surgery, recent prolonged travel, bedridden )     no 10. OTHER SYMPTOMS: "Do you have any other symptoms? (e.g., runny nose, wheezing, chest pain)        No wheezing; nose now runny 11. PREGNANCY: "Is there any chance you are pregnant?" "When was your last menstrual period?"       No  12. TRAVEL: "Have you traveled out of the country in the last month?" (e.g., travel history, exposures)       no  Protocols used: DIZZINESS - LIGHTHEADEDNESS-A-AH, COUGH - ACUTE NON-PRODUCTIVE-A-AH

## 2017-05-19 NOTE — Progress Notes (Signed)
HPI:  Acute visit for cough: -Reports this is been intermittent for several months -Usually during the day -Postnasal drip, sensation of mucus in the throat and intermittent coughing -Denies: Fevers, malaise, unexplained weight loss, hemoptysis, exertion, wheezing, shortness of breath -History of allergies, not taking her allergy pill -History of asthma as a child, no trouble with this as an adult -Very remote, and very light smoking as a teenager, no adult smoking history -She had negative tuberculosis screening earlier this year  ROS: See pertinent positives and negatives per HPI.  Past Medical History:  Diagnosis Date  . Actinic keratosis 03/15/2012  . Anemia   . Blood transfusion without reported diagnosis   . Childhood asthma    resolved  . Diabetes mellitus without complication ([place])   . Hyperlipidemia   . Hypertension   . 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 CESAREAN SECTION    . COLONOSCOPY  12/2016   negative screening    Family History  Problem Relation Age of Onset  . Diabetes Father   . Hypertension Father   . Hypertension Mother   . Colon cancer Neg Hx     Social History   Socioeconomic History  . Marital status: Single    Spouse name: None  . Number of children: None  . Years of education: None  . Highest education level: None  Social Needs  . Financial resource strain: None  . Food insecurity - worry: None  . Food insecurity - inability: None  . Transportation needs - medical: None  . Transportation needs - non-medical: None  Occupational History  . None  Tobacco Use  . Smoking status: Former Research scientist (life sciences)  . Smokeless tobacco: Never Used  Substance and Sexual Activity  . Alcohol use: Yes    Comment: socially-once a month per pt.  . Drug use: No  . Sexual activity: Not Currently    Birth control/protection: None  Other Topics Concern  .  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  amLODipine (NORVASC) 10 MG tablet, TAKE 1 TABLET BY MOUTH DAILY, Disp: 90 tablet, Rfl: 1 .  glipiZIDE (GLUCOTROL) 5 MG tablet, Take 1 tablet (5 mg total) 2 (two) times daily before a meal by mouth., Disp: 60 tablet, Rfl: 3 .  glucose blood (ACCU-CHEK AVIVA) test strip, Test glucose once daily, Dx E11.9, Disp: 100 each, Rfl: 3 .  loratadine (CLARITIN) 5 MG chewable tablet, Chew 10 mg by mouth daily., Disp: , Rfl:  .  metFORMIN (GLUCOPHAGE) 1000 MG tablet, TAKE 1 TABLET BY MOUTH TWICE DAILY WITH MEALS (Patient taking differently: TAKE 1 TABLET BY MOUTH ONCE A DAY), Disp: 180 tablet, Rfl: 0 .  Multiple Vitamins-Minerals (ONE-A-DAY 50 PLUS PO), Take by mouth., Disp: , Rfl:  .  pravastatin (PRAVACHOL) 40 MG tablet, TAKE 1 TABLET BY MOUTH EVERY DAY, Disp: 90 tablet, Rfl: 0 .  Probiotic Product (PRO-BIOTIC BLEND PO), Take 1 tablet by mouth daily. , Disp: , Rfl:  .  benzonatate (TESSALON PERLES) 100 MG capsule, Take 1 capsule (100 mg total) by mouth 3 (three)  times daily as needed., Disp: 20 capsule, Rfl: 0 .  fluticasone (FLONASE) 50 MCG/ACT nasal spray, Place 2 sprays into both nostrils daily., Disp: 16 g, Rfl: 6  Current Facility-Administered Medications:  .  0.9 %  sodium chloride infusion, 500 mL, Intravenous, Continuous, Gatha Mayer, MD  EXAM:  Vitals:   05/19/17 1541  BP: 116/82  Pulse: 99  Temp: 98.8 F (37.1 C)  SpO2: 96%    Body mass index is 31.46 kg/m.  GENERAL: vitals reviewed and listed above, alert, oriented, appears well hydrated and in no acute distress  HEENT: atraumatic, conjunttiva clear, no obvious abnormalities on inspection of external nose and ears, normal appearance of ear canals and  TMs, clear nasal congestion, mild post oropharyngeal erythema with PND, no tonsillar edema or exudate, no sinus TTP  NECK: no obvious masses on inspection  LUNGS: clear to auscultation bilaterally, no wheezes, rales or rhonchi, good air movement  CV: HRRR, no peripheral edema  MS: moves all extremities without noticeable abnormality  PSYCH: pleasant and cooperative, no obvious depression or anxiety  ASSESSMENT AND PLAN:  Discussed the following assessment and plan:  Chronic cough - Plan: DG Chest 2 View  Postnasal drip - Plan: benzonatate (TESSALON PERLES) 100 MG capsule, fluticasone (FLONASE) 50 MCG/ACT nasal spray  Allergic rhinitis, unspecified seasonality, unspecified trigger  -we discussed possible serious and likely etiologies, workup and treatment, treatment risks and return precautions -most likely cause of her chronic cough like this is allergies or silent reflux -after this discussion, Wyllow opted for: -We will get a chest x-ray to exclude other -Start allergy regimen for 1 month, if this does not work we will start an acid reducer for 2 weeks, if this does not work we will have her see the pulmonologist for lung testing and further evaluation -follow up advised in 1 month -Tessalon for cough in the interim -of course, we advised Sharlyne  to return or notify a doctor immediately if symptoms worsen or persist or new concerns arise.    Patient Instructions  BEFORE YOU LEAVE: -xray sheet -follow up:  1 month  Go get the x-ray on Monday.  Start an allergy pill such as Claritin once daily.  Also start Flonase 2 sprays each nostril daily for 1 month.  Use the Tessalon as needed for the cough per the instructions.  Follow-up sooner if worsening or new concerns.    Colin Benton R., DO

## 2017-05-19 NOTE — Telephone Encounter (Signed)
Copied from [place] [id number]. Topic: Quick Communication - See Telephone Encounter >> May 19, 2017 11:16 AM Ether Griffins B wrote: CRM for notification. See Telephone encounter for:  Pt has had cough for several months and would like a cough medicine w/out narcotic. 05/19/17.

## 2017-05-19 NOTE — Telephone Encounter (Signed)
Has appointment today

## 2017-05-23 ENCOUNTER — Ambulatory Visit (INDEPENDENT_AMBULATORY_CARE_PROVIDER_SITE_OTHER)
Admission: RE | Admit: 2017-05-23 | Discharge: 2017-05-23 | Disposition: A | Discharge status: Home / Self Care | Payer: No Typology Code available for payment source | Source: Ambulatory Visit | Attending: Family Medicine | Admitting: Family Medicine

## 2017-05-23 DIAGNOSIS — R05 Cough: Secondary | ICD-10-CM

## 2017-05-23 DIAGNOSIS — R053 Chronic cough: Secondary | ICD-10-CM

## 2017-06-17 NOTE — Progress Notes (Deleted)
HPI:  Follow up chronic cough: -did trial ins/allergy regimen last visit as PMH/smx suggested this may be most likely, plan to try ppi if this did not work -cxr neg except incidental cardiomegaly - hx htn, hld, DM -reports: -denies: -  ROS: See pertinent positives and negatives per HPI.  Past Medical History:  Diagnosis Date  . Actinic keratosis 03/15/2012  . Anemia   . Blood transfusion without reported diagnosis   . Childhood asthma    resolved  . Diabetes mellitus without complication ([place])   . Hyperlipidemia   . Hypertension   . 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 CESAREAN SECTION    . COLONOSCOPY  12/2016   negative screening    Family History  Problem Relation Age of Onset  . Diabetes Father   . Hypertension Father   . Hypertension Mother   . Colon cancer Neg Hx     Social History   Socioeconomic History  . Marital status: Single    Spouse name: Not on file  . Number of children: Not on file  . Years of education: Not on file  . Highest education level: Not on file  Social Needs  . Financial resource strain: Not on file  . Food insecurity - worry: Not on file  . Food insecurity - inability: Not on file  . Transportation needs - medical: Not on file  . Transportation needs - non-medical: Not on file  Occupational History  . Not on file  Tobacco Use  . Smoking status: Former Research scientist (life sciences)  . Smokeless tobacco: Never Used  Substance and Sexual Activity  . Alcohol use: Yes    Comment: socially-once a month per pt.  . Drug use: No  . Sexual activity: Not Currently    Birth control/protection: None  Other Topics Concern  . Not on file  Social History Narrative   Work or School: live in caregiver      Home Situation: lives with client several days per week      Spiritual Beliefs: catholic      Lifestyle: exercises 2 days per week; diet is pretty  healthy        Current Outpatient Medications:  .  amLODipine (NORVASC) 10 MG tablet, TAKE 1 TABLET BY MOUTH DAILY, Disp: 90 tablet, Rfl: 1 .  benzonatate (TESSALON PERLES) 100 MG capsule, Take 1 capsule (100 mg total) by mouth 3 (three) times daily as needed., Disp: 20 capsule, Rfl: 0 .  fluticasone (FLONASE) 50 MCG/ACT nasal spray, Place 2 sprays into both nostrils daily., Disp: 16 g, Rfl: 6 .  glipiZIDE (GLUCOTROL) 5 MG tablet, Take 1 tablet (5 mg total) 2 (two) times daily before a meal by mouth., Disp: 60 tablet, Rfl: 3 .  glucose blood (ACCU-CHEK AVIVA) test strip, Test glucose once daily, Dx E11.9, Disp: 100 each, Rfl: 3 .  loratadine (CLARITIN) 5 MG chewable tablet, Chew 10 mg by mouth daily., Disp: , Rfl:  .  metFORMIN (GLUCOPHAGE) 1000 MG tablet, TAKE 1 TABLET BY MOUTH TWICE DAILY WITH MEALS (Patient taking differently: TAKE 1 TABLET BY MOUTH ONCE A DAY), Disp: 180 tablet, Rfl: 0 .  Multiple Vitamins-Minerals (ONE-A-DAY 50 PLUS PO), Take by mouth., Disp: , Rfl:  .  pravastatin (PRAVACHOL) 40 MG tablet, TAKE 1 TABLET BY MOUTH EVERY DAY, Disp: 90 tablet, Rfl: 0 .  Probiotic Product (PRO-BIOTIC BLEND PO), Take 1  tablet by mouth daily. , Disp: , Rfl:   Current Facility-Administered Medications:  .  0.9 %  sodium chloride infusion, 500 mL, Intravenous, Continuous, Carlean Purl Ofilia Neas, MD  EXAM:  There were no vitals filed for this visit.  There is no height or weight on file to calculate BMI.  GENERAL: vitals reviewed and listed above, alert, oriented, appears well hydrated and in no acute distress  HEENT: atraumatic, conjunttiva clear, no obvious abnormalities on inspection of external nose and ears  NECK: no obvious masses on inspection  LUNGS: clear to auscultation bilaterally, no wheezes, rales or rhonchi, good air movement  CV: HRRR, no peripheral edema  MS: moves all extremities without noticeable abnormality *** PSYCH: pleasant and cooperative, no obvious depression or  anxiety  ASSESSMENT AND PLAN:  Discussed the following assessment and plan:  No diagnosis found.  *** -Patient advised to return or notify a doctor immediately if symptoms worsen or persist or new concerns arise.  There are no Patient Instructions on file for this visit.  Colin Benton R., DO

## 2017-06-19 ENCOUNTER — Ambulatory Visit: Payer: Self-pay | Admitting: Family Medicine

## 2017-07-10 ENCOUNTER — Other Ambulatory Visit: Payer: Self-pay | Admitting: Family Medicine

## 2017-07-23 NOTE — Progress Notes (Deleted)
HPI:  Hayley Fox is a pleasant 62 y.o. here for follow up. Chronic medical problems summarized below were reviewed for changes and stability and were updated as needed below. These issues and their treatment remain stable for the most part. ***. Denies CP, SOB, DOE, treatment intolerance or new symptoms. DUe for CPE with pap, labs, microalb, pneumococcal, flu vaccine (refused vaccines in the past), acei or arb?, asa?, crestor?  DM with renal complications: -cardiomegaly on CXR -worsened in 2018 -meds:  Metformin, glipizide  HTN/HLD: -meds: amlodipine, pravastatin  Chronic cough: -no acute findings on CXR -thought 2ndary to pnd from allergies -was to try claritin and flonase  ROS: See pertinent positives and negatives per HPI.  Past Medical History:  Diagnosis Date  . Actinic keratosis 03/15/2012  . Anemia   . Blood transfusion without reported diagnosis   . Childhood asthma    resolved  . Diabetes mellitus without complication ([place])   . Hyperlipidemia   . Hypertension   . 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 CESAREAN SECTION    . COLONOSCOPY  12/2016   negative screening    Family History  Problem Relation Age of Onset  . Diabetes Father   . Hypertension Father   . Hypertension Mother   . Colon cancer Neg Hx     Social History   Socioeconomic History  . Marital status: Single    Spouse name: Not on file  . Number of children: Not on file  . Years of education: Not on file  . Highest education level: Not on file  Social Needs  . Financial resource strain: Not on file  . Food insecurity - worry: Not on file  . Food insecurity - inability: Not on file  . Transportation needs - medical: Not on file  . Transportation needs - non-medical: Not on file  Occupational History  . Not on file  Tobacco Use  . Smoking status: Former Research scientist (life sciences)  . Smokeless tobacco:  Never Used  Substance and Sexual Activity  . Alcohol use: Yes    Comment: socially-once a month per pt.  . Drug use: No  . Sexual activity: Not Currently    Birth control/protection: None  Other Topics Concern  . Not on file  Social History Narrative   Work or School: live in caregiver      Home Situation: lives with client several days per week      Spiritual Beliefs: catholic      Lifestyle: exercises 2 days per week; diet is pretty healthy        Current Outpatient Medications:  .  amLODipine (NORVASC) 10 MG tablet, TAKE 1 TABLET BY MOUTH DAILY, Disp: 90 tablet, Rfl: 1 .  benzonatate (TESSALON PERLES) 100 MG capsule, Take 1 capsule (100 mg total) by mouth 3 (three) times daily as needed., Disp: 20 capsule, Rfl: 0 .  fluticasone (FLONASE) 50 MCG/ACT nasal spray, Place 2 sprays into both nostrils daily., Disp: 16 g, Rfl: 6 .  glipiZIDE (GLUCOTROL) 5 MG tablet, TAKE 1 TABLET BY MOUTH TWICE DAILY BEFORE A MEAL, Disp: 180 tablet, Rfl: 1 .  glucose blood (ACCU-CHEK AVIVA) test strip, Test glucose once daily, Dx E11.9, Disp: 100 each, Rfl: 3 .  loratadine (CLARITIN) 5 MG chewable tablet, Chew 10 mg by mouth daily., Disp: , Rfl:  .  metFORMIN (GLUCOPHAGE) 1000 MG tablet, TAKE 1 TABLET BY  MOUTH TWICE DAILY WITH MEALS (Patient taking differently: TAKE 1 TABLET BY MOUTH ONCE A DAY), Disp: 180 tablet, Rfl: 0 .  Multiple Vitamins-Minerals (ONE-A-DAY 50 PLUS PO), Take by mouth., Disp: , Rfl:  .  pravastatin (PRAVACHOL) 40 MG tablet, TAKE 1 TABLET BY MOUTH EVERY DAY, Disp: 90 tablet, Rfl: 0 .  pravastatin (PRAVACHOL) 40 MG tablet, TAKE 1 TABLET BY MOUTH EVERY DAY, Disp: 90 tablet, Rfl: 1 .  Probiotic Product (PRO-BIOTIC BLEND PO), Take 1 tablet by mouth daily. , Disp: , Rfl:   Current Facility-Administered Medications:  .  0.9 %  sodium chloride infusion, 500 mL, Intravenous, Continuous, Carlean Purl Ofilia Neas, MD  EXAM:  There were no vitals filed for this visit.  There is no height or weight  on file to calculate BMI.  GENERAL: vitals reviewed and listed above, alert, oriented, appears well hydrated and in no acute distress  HEENT: atraumatic, conjunttiva clear, no obvious abnormalities on inspection of external nose and ears  NECK: no obvious masses on inspection  LUNGS: clear to auscultation bilaterally, no wheezes, rales or rhonchi, good air movement  CV: HRRR, no peripheral edema  MS: moves all extremities without noticeable abnormality *** PSYCH: pleasant and cooperative, no obvious depression or anxiety  ASSESSMENT AND PLAN:  Discussed the following assessment and plan:  No diagnosis found.  *** -Patient advised to return or notify a doctor immediately if symptoms worsen or persist or new concerns arise.  There are no Patient Instructions on file for this visit.  Lucretia Kern, DO

## 2017-07-24 ENCOUNTER — Ambulatory Visit: Payer: Self-pay | Admitting: Family Medicine

## 2017-07-24 DIAGNOSIS — Z0289 Encounter for other administrative examinations: Secondary | ICD-10-CM

## 2017-11-08 ENCOUNTER — Other Ambulatory Visit: Payer: Self-pay | Admitting: Family Medicine

## 2017-11-09 ENCOUNTER — Other Ambulatory Visit: Payer: Self-pay | Admitting: Family Medicine

## 2017-12-14 ENCOUNTER — Other Ambulatory Visit: Payer: Self-pay | Admitting: Family Medicine

## 2017-12-15 ENCOUNTER — Other Ambulatory Visit: Payer: Self-pay | Admitting: Family Medicine

## 2018-01-06 ENCOUNTER — Other Ambulatory Visit: Payer: Self-pay | Admitting: Family Medicine

## 2018-02-12 ENCOUNTER — Other Ambulatory Visit: Payer: Self-pay | Admitting: Family Medicine

## 2018-02-15 ENCOUNTER — Other Ambulatory Visit: Payer: Self-pay | Admitting: Family Medicine

## 2018-03-20 ENCOUNTER — Other Ambulatory Visit: Payer: Self-pay | Admitting: Family Medicine

## 2018-03-21 ENCOUNTER — Other Ambulatory Visit: Payer: Self-pay | Admitting: Family Medicine

## 2018-03-30 ENCOUNTER — Ambulatory Visit (INDEPENDENT_AMBULATORY_CARE_PROVIDER_SITE_OTHER): Payer: Self-pay | Admitting: Adult Health

## 2018-03-30 ENCOUNTER — Ambulatory Visit (INDEPENDENT_AMBULATORY_CARE_PROVIDER_SITE_OTHER): Payer: No Typology Code available for payment source

## 2018-03-30 ENCOUNTER — Other Ambulatory Visit: Payer: Self-pay | Admitting: Adult Health

## 2018-03-30 ENCOUNTER — Encounter: Payer: Self-pay | Admitting: Adult Health

## 2018-03-30 VITALS — BP 120/62 | HR 98 | Temp 98.7°F | Wt 186.2 lb

## 2018-03-30 DIAGNOSIS — T148XXA Other injury of unspecified body region, initial encounter: Secondary | ICD-10-CM

## 2018-03-30 DIAGNOSIS — M25562 Pain in left knee: Secondary | ICD-10-CM

## 2018-03-30 DIAGNOSIS — M7632 Iliotibial band syndrome, left leg: Secondary | ICD-10-CM

## 2018-03-30 MED ORDER — BENZONATATE 100 MG PO CAPS: 100.0000 mg | ORAL_CAPSULE | Freq: Two times a day (BID) | ORAL | 0 refills | Status: DC | PRN

## 2018-03-30 MED ORDER — CYCLOBENZAPRINE HCL 5 MG PO TABS: 5.0000 mg | ORAL_TABLET | Freq: Every day | ORAL | 0 refills | Status: DC

## 2018-03-30 NOTE — Progress Notes (Signed)
Subjective:    Patient ID: Hayley Fox, female    DOB: 05-Feb-1956, 62 y.o.   MRN: [id number]  HPI  61 year old female who  has a past medical history of Actinic keratosis (03/15/2012), Anemia, Blood transfusion without reported diagnosis, Childhood asthma, Diabetes mellitus without complication ([place]), Hyperlipidemia, Hypertension, and Osteoarthrosis, hand (06/06/2008).  She is a patient of Dr. Maudie Mercury who I am seeing today for an acute issue of left knee and thigh pain.  She reports that she was walking on a trail outside of her house about 2 weeks ago and tripped over a tree branch per patient report she fell onto her knee.  She was able to get up under her own power and walk home but had discomfort in doing so.  Over the last 2 weeks she is felt as though her knee and leg pain has improved unless she is climbing stairs.  Pain is felt as an "ache" coming from inside the knee she also has pain in her left buttocks and lateral thigh.  He denies any bruising, swelling, or redness.  She has been using Tylenol without relief  Review of Systems See HPI   Past Medical History:  Diagnosis Date  . Actinic keratosis 03/15/2012  . Anemia   . Blood transfusion without reported diagnosis   . Childhood asthma    resolved  . Diabetes mellitus without complication ([place])   . Hyperlipidemia   . Hypertension   . 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 Marital status: Single    Spouse name: Not on file  . Number of children: Not on file  . Years of education: Not on file  . Highest education level: Not on file  Occupational History  . Not on file  Social Needs  . Financial resource strain: Not on file  . Food insecurity:    Worry: Not on file    Inability: Not on file  . Transportation needs:    Medical: Not on file    Non-medical: Not on file  Tobacco Use  . Smoking status: Former Research scientist (life sciences)  . Smokeless  tobacco: Never Used  Substance and Sexual Activity  . Alcohol use: Yes    Comment: socially-once a month per pt.  . Drug use: No  . Sexual activity: Not Currently    Birth control/protection: None  Lifestyle  . Physical activity:    Days per week: Not on file    Minutes per session: Not on file  . Stress: Not on file  Relationships  . Social connections:    Talks on phone: Not on file    Gets together: Not on file    Attends religious service: Not on file    Active member of club or organization: Not on file    Attends meetings of clubs or organizations: Not on file    Relationship status: Not on file  . Intimate partner violence:    Fear of current or ex partner: Not on file    Emotionally abused: Not on file    Physically abused: Not on file    Forced sexual activity: Not on file  Other Topics Concern  . Not on file  Social History Narrative   Work or School: live in caregiver      Home Situation: lives with client several days per week      Spiritual Beliefs: catholic  Lifestyle: exercises 2 days per week; diet is pretty healthy       Past Surgical History:  Procedure Laterality Date  . CESAREAN SECTION    . COLONOSCOPY  12/2016   negative screening    Family History  Problem Relation Age of Onset  . Diabetes Father   . Hypertension Father   . Hypertension Mother   . Colon cancer Neg Hx     No Known Allergies  Current Outpatient Medications on File Prior to Visit  Medication Sig Dispense Refill  . amLODipine (NORVASC) 10 MG tablet TAKE 1 TABLET BY MOUTH DAILY 90 tablet 1  . amLODipine (NORVASC) 10 MG tablet TAKE 1 TABLET BY MOUTH EVERY DAY 30 tablet 0  . benzonatate (TESSALON PERLES) 100 MG capsule Take 1 capsule (100 mg total) by mouth 3 (three) times daily as needed. 20 capsule 0  . glipiZIDE (GLUCOTROL) 5 MG tablet TAKE 1 TABLET BY MOUTH TWICE DAILY BEFORE A MEAL 180 tablet 1  . glipiZIDE (GLUCOTROL) 5 MG tablet TAKE 1 TABLET BY MOUTH TWICE DAILY  BEFORE A MEAL 60 tablet 0  . metFORMIN (GLUCOPHAGE) 1000 MG tablet TAKE 1 TABLET BY MOUTH TWICE DAILY WITH MEALS (Patient taking differently: daily. ) 60 tablet 0  . metFORMIN (GLUCOPHAGE) 1000 MG tablet TAKE 1 TABLET BY MOUTH TWICE DAILY WITH MEALS 60 tablet 0  . Multiple Vitamins-Minerals (ONE-A-DAY 50 PLUS PO) Take by mouth.    . pravastatin (PRAVACHOL) 40 MG tablet TAKE 1 TABLET BY MOUTH EVERY DAY 90 tablet 0  . pravastatin (PRAVACHOL) 40 MG tablet TAKE 1 TABLET BY MOUTH EVERY DAY 90 tablet 0  . Probiotic Product (PRO-BIOTIC BLEND PO) Take 1 tablet by mouth daily.      Current Facility-Administered Medications on File Prior to Visit  Medication Dose Route Frequency Provider Last Rate Last Dose  . 0.9 %  sodium chloride infusion  500 mL Intravenous Continuous Gatha Mayer, MD        BP 120/62 (BP Location: Left Arm, Patient Position: Sitting, Cuff Size: Normal)   Pulse 98   Temp 98.7 F (37.1 C) (Oral)   Wt 186 lb 3.2 oz (84.5 kg)   LMP 07/29/2011   SpO2 97%   BMI 31.96 kg/m       Objective:   Physical Exam  Constitutional: She is oriented to person, place, and time. She appears well-developed and well-nourished. No distress.  Cardiovascular: Normal rate, regular rhythm, normal heart sounds and intact distal pulses.  Pulmonary/Chest: Effort normal and breath sounds normal.  Musculoskeletal: Normal range of motion. She exhibits no edema, tenderness or deformity.       Left knee: Normal. She exhibits normal range of motion, no swelling, no effusion, normal alignment, no LCL laxity, normal patellar mobility, no bony tenderness, normal meniscus and no MCL laxity. No tenderness found. No medial joint line, no lateral joint line, no MCL, no LCL and no patellar tendon tenderness noted.  Pain with bending of the knee.  No pain in the knee with needed chest, internal or external rotation.  She does have discomfort in her left glutes and along the IT band with knee to chest and internal  rotation  Negative anterior and posterior drawer test  Pain with palpation along left IT band   Neurological: She is alert and oriented to person, place, and time.  Skin: Skin is warm. She is not diaphoretic.  Small superficial abrasion on patella  Psychiatric: She has a normal mood and affect. Her  behavior is normal. Judgment and thought content normal.  Nursing note and vitals reviewed.     Assessment & Plan:  1. Acute pain of left knee -Likely bruising.  No concern for MCL, LCL, or meniscal tear.  Advised warm compress and can take Motrin as needed  2. It band syndrome, left -Stretching exercises and warm compress - cyclobenzaprine (FLEXERIL) 5 MG tablet; Take 1 tablet (5 mg total) by mouth at bedtime.  Dispense: 10 tablet; Refill: 0  3. Muscle strain  - cyclobenzaprine (FLEXERIL) 5 MG tablet; Take 1 tablet (5 mg total) by mouth at bedtime.  Dispense: 10 tablet; Refill: 0   Dorothyann Peng, NP

## 2018-04-04 ENCOUNTER — Telehealth: Payer: Self-pay | Admitting: *Deleted

## 2018-04-04 ENCOUNTER — Telehealth: Payer: Self-pay | Admitting: Family Medicine

## 2018-04-04 NOTE — Telephone Encounter (Signed)
Left message on machine for patient returning her call 

## 2018-04-04 NOTE — Telephone Encounter (Signed)
Copied from [place] [id number]. Topic: Quick Communication - See Telephone Encounter >> Apr 04, 2018 11:13 AM Conception Chancy, NT wrote: CRM for notification. See Telephone encounter for: 04/04/18.  Patient is requesting a refill on cyclobenzaprine (FLEXERIL) 5 MG tablet. She states her leg is still hurting and would like to know if the dosage could be increased.  [facility] [place], [place] AT [facility] [address] [place] [address] Phone: [phone] Fax: [phone]

## 2018-04-04 NOTE — Telephone Encounter (Signed)
Copied from [place] [id number]. Topic: General - Inquiry >> Apr 04, 2018 11:12 AM Conception Chancy, NT wrote: Reason for CRM: patient is requesting her xray results from 03/30/18.

## 2018-04-04 NOTE — Telephone Encounter (Signed)
Pt has been given x-ray result.  No further action required.

## 2018-04-05 NOTE — Telephone Encounter (Signed)
I called the pt and informed her of the message below.  She stated she has an appt on 10/22.

## 2018-04-05 NOTE — Telephone Encounter (Signed)
I haven't seen her for this and have not seen her in quite some time. Please schedule appt with me. Thanks.

## 2018-04-09 LAB — HM DIABETES EYE EXAM

## 2018-04-10 ENCOUNTER — Ambulatory Visit (INDEPENDENT_AMBULATORY_CARE_PROVIDER_SITE_OTHER): Payer: Self-pay | Admitting: Family Medicine

## 2018-04-10 ENCOUNTER — Encounter: Payer: Self-pay | Admitting: Family Medicine

## 2018-04-10 VITALS — BP 140/80 | HR 100 | Temp 98.7°F | Ht 64.0 in | Wt 185.7 lb

## 2018-04-10 DIAGNOSIS — E1169 Type 2 diabetes mellitus with other specified complication: Secondary | ICD-10-CM

## 2018-04-10 DIAGNOSIS — M25562 Pain in left knee: Secondary | ICD-10-CM

## 2018-04-10 DIAGNOSIS — E119 Type 2 diabetes mellitus without complications: Secondary | ICD-10-CM

## 2018-04-10 DIAGNOSIS — E785 Hyperlipidemia, unspecified: Secondary | ICD-10-CM

## 2018-04-10 DIAGNOSIS — E1159 Type 2 diabetes mellitus with other circulatory complications: Secondary | ICD-10-CM

## 2018-04-10 DIAGNOSIS — I10 Essential (primary) hypertension: Secondary | ICD-10-CM

## 2018-04-10 DIAGNOSIS — I152 Hypertension secondary to endocrine disorders: Secondary | ICD-10-CM

## 2018-04-10 LAB — BASIC METABOLIC PANEL
BUN: 20 mg/dL (ref 6–23)
CHLORIDE: 101 meq/L (ref 96–112)
CO2: 27 mEq/L (ref 19–32)
Calcium: 9.8 mg/dL (ref 8.4–10.5)
Creatinine, Ser: 0.66 mg/dL (ref 0.40–1.20)
GFR: 116.54 mL/min (ref >60.00)
GLUCOSE: 190 mg/dL — AB (ref 70–99)
POTASSIUM: 4.3 meq/L (ref 3.5–5.1)
Sodium: 138 mEq/L (ref 135–145)

## 2018-04-10 LAB — HEMOGLOBIN A1C: HEMOGLOBIN A1C: 9.5 % — AB (ref 4.6–6.5)

## 2018-04-10 MED ORDER — LOSARTAN POTASSIUM 50 MG PO TABS: 50.0000 mg | ORAL_TABLET | Freq: Every day | ORAL | 1 refills | Status: DC

## 2018-04-10 NOTE — Progress Notes (Signed)
HPI:  Using dictation device. Unfortunately this device frequently misinterprets words/phrases.  Acute visit for L knee pain: -started around the beginning of October with fall over limb on walk - report knee bent back and she suffered abrasions and immediate pain, she thinks a little swollen -saw Tommi Rumps 03/30/18 and had xrays -reports no better, severe pain at time if up on legs all day and at night, worse with almost any movement -muscle relaxer did not help -no insurance -no weakness, b or b dysfunction -pain is mainly L lateral jt line, occ pain in lat lower leg, ok L butock pain, ok tingling in toes - but these are rare   She has diabetes, hypertension and hyperlipidemia. Has not been in for follow up in a very long time. Reports does not have insurance. Reports taking all of her medications, but refill review suggests otherwise. Asked her several times and reported taking and reported taking today. Denies CP, SOB, HA, malaise, fatigue, swelling.   ROS: See pertinent positives and negatives per HPI.  Past Medical History:  Diagnosis Date  . Actinic keratosis 03/15/2012  . Anemia   . Blood transfusion without reported diagnosis   . Childhood asthma    resolved  . Diabetes mellitus without complication ([place])   . Hyperlipidemia   . Hypertension   . 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 CESAREAN SECTION    . COLONOSCOPY  12/2016   negative screening    Family History  Problem Relation Age of Onset  . Diabetes Father   . Hypertension Father   . Hypertension Mother   . Colon cancer Neg Hx     SOCIAL HX: see hpi   Current Outpatient Medications:  .  amLODipine (NORVASC) 10 MG tablet, TAKE 1 TABLET BY MOUTH EVERY DAY, Disp: 30 tablet, Rfl: 0 .  glipiZIDE (GLUCOTROL) 5 MG tablet, TAKE 1 TABLET BY MOUTH TWICE DAILY BEFORE A MEAL, Disp: 180 tablet, Rfl: 1 .  metFORMIN  (GLUCOPHAGE) 1000 MG tablet, TAKE 1 TABLET BY MOUTH TWICE DAILY WITH MEALS (Patient taking differently: Take 500 mg by mouth daily with breakfast. ), Disp: 60 tablet, Rfl: 0 .  Multiple Vitamins-Minerals (ONE-A-DAY 50 PLUS PO), Take by mouth., Disp: , Rfl:  .  pravastatin (PRAVACHOL) 40 MG tablet, TAKE 1 TABLET BY MOUTH EVERY DAY, Disp: 90 tablet, Rfl: 0 .  Probiotic Product (PRO-BIOTIC BLEND PO), Take 1 tablet by mouth daily. , Disp: , Rfl:  .  losartan (COZAAR) 50 MG tablet, Take 1 tablet (50 mg total) by mouth daily., Disp: 90 tablet, Rfl: 1  Current Facility-Administered Medications:  .  0.9 %  sodium chloride infusion, 500 mL, Intravenous, Continuous, Gatha Mayer, MD  EXAM:  Vitals:   04/10/18 1129 04/10/18 1132  BP: (!) 140/96 140/80  Pulse: 100   Temp: 98.7 F (37.1 C)     Body mass index is 31.88 kg/m.  GENERAL: vitals reviewed and listed above, alert, oriented, appears well hydrated and in no acute distress  HEENT: atraumatic, conjunttiva clear, no obvious abnormalities on inspection of external nose and ears  NECK: no obvious masses on inspection  LUNGS: clear to auscultation bilaterally, no wheezes, rales or rhonchi, good air movement  CV: HRRR, no peripheral edema  MS: moves all extremities without noticeable abnormality, on inspection of the knees and lower extremities, it seems she has a little bit of  swelling around the left lateral collateral ligament and she is quite tender in the left LCL, otherwise normal strength, DTRs and sensitivity throughout the lower extremities bilaterally, negative drawer testing, she has significant pain with McMurray, negative Lachman, neurovascularly intact distally, no pain with palpation of deep veins  PSYCH: pleasant and cooperative, no obvious depression or anxiety  ASSESSMENT AND PLAN:  Discussed the following assessment and plan:  Pain in lateral portion of left knee  Hypertension associated with diabetes ([place]) - Plan:  Basic metabolic panel  Type 2 diabetes mellitus without complication, without long-term current use of insulin ([place]) - Plan: Hemoglobin A1c  Hyperlipidemia associated with type 2 diabetes mellitus ([place])  -For the knee pain, discussed potential etiologies with LCL strain or tear very likely, along with possible meniscal injury versus other -she has no insurance, try to treat this as conservatively as possible.  Did suggest that usually with this degree of pain that suggest an MRI, orthopedic or sports medicine evaluation.  She would like to try some home exercises first, ice and symptomatic care little longer.  Did recommend that she call us promptly if worsening or not improving over the next 2 weeks and then I would recommend she see a specialist.  After discussion of options, she is to see sports medicine if improving or worsening over the next few weeks. -Anti-inflammatory would probably work best for pain, her blood pressure is up.  Her blood pressure we have decided to add a medication after discussion of options we will start losartan.  We did make her aware of the recall.  However this may be the cheapest option that is also hopeful given her history of diabetes.  We also will check her kidney function.  She agreed to limited labs today. -She is due for a number of health maintenance measures, discussed, however find most given her health insurance issues -Discussed options through current health to assist, however she works though reports does not qualify for most.  Also discussed low cost or uninsured clinic options.  Prefers to come here.  Could set up payment plans if needed.  However, I did let her know it is difficult for [place] to assist her if she cannot come for visits or do any of the recommended testing for treatment.  We will do the best we can. -Advised follow-up in 1 month, phone follow-up in 2 weeks -Patient advised to return or notify a doctor immediately if symptoms worsen or persist  or new concerns arise.  Patient Instructions  BEFORE YOU LEAVE: -labs -home exercises -follow up: 1 month  Add the Losartan to help the blood pressure. Check with pharmacy about recall.   Do the exercises 4 days per week.  Ice if hurting after activity.  Naproxen if kidneys ok and blood pressure improving for pain for 1-2 weeks. 1 tablet 1-2 times daily.  Tylenol 500-1000mg  up to 3 times daily if needed for pain.  Topical menthol (tiger balm) or capsaicin OTC sport creams as needed for pain.   Call in 2 weeks or sooner if worsening.      Hayley Kern, DO

## 2018-04-10 NOTE — Patient Instructions (Addendum)
BEFORE YOU LEAVE: -labs -home exercises -follow up: 1 month  Add the Losartan to help the blood pressure. Check with pharmacy about recall.   Do the exercises 4 days per week.  Ice if hurting after activity.  Naproxen if kidneys ok and blood pressure improving for pain for 1-2 weeks. 1 tablet 1-2 times daily.  Tylenol 500-1000mg  up to 3 times daily if needed for pain.  Topical menthol (tiger balm) or capsaicin OTC sport creams as needed for pain.   Call in 2 weeks or sooner if worsening.

## 2018-04-12 ENCOUNTER — Other Ambulatory Visit: Payer: Self-pay | Admitting: Family Medicine

## 2018-04-15 ENCOUNTER — Other Ambulatory Visit: Payer: Self-pay | Admitting: Family Medicine

## 2018-04-16 NOTE — Progress Notes (Signed)
HPI:  Using dictation device. Unfortunately this device frequently misinterprets words/phrases.  Acute visit for Diabetes: -uncontrolled -with hyperlipidemia and hypertension -no insurance, make too much for assistance per her report -meds currently include glipizide, metformin, statin, losartan, amlodipine -reports: "will not take insulin", working on diet - cutting out sweets. Doing quick oats, chicken on salad and Glucerna shake for dinner -denies: CP, vision changes -only taking metformin once daily as upset her stomach -wants another oral med -not exercising, but plans to - saw Dr. Tonna Corner for her leg issues and gabapentin helping - reports he told her it is coming from her back and put her on opiod, prednisone and gabapentin - but she only is taking the gabapentin and is doing better -wants referral to diabetes educator - understands will be expensive -some DOE for a long time and wonders about her heart, was out of shape when started walking again, but feels more so then usual  ROS: See pertinent positives and negatives per HPI.  Past Medical History:  Diagnosis Date  . Actinic keratosis 03/15/2012  . Anemia   . Blood transfusion without reported diagnosis   . Childhood asthma    resolved  . Diabetes mellitus without complication ([place])   . Hyperlipidemia   . Hypertension   . 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 CESAREAN SECTION    . COLONOSCOPY  12/2016   negative screening    Family History  Problem Relation Age of Onset  . Diabetes Father   . Hypertension Father   . Hypertension Mother   . Colon cancer Neg Hx     SOCIAL HX: see hpi   Current Outpatient Medications:  .  amLODipine (NORVASC) 10 MG tablet, TAKE 1 TABLET BY MOUTH EVERY DAY, Disp: 30 tablet, Rfl: 0 .  gabapentin (NEURONTIN) 300 MG capsule, TK 1 C PO IN THE MORNING THEN TK 2 CS PO ATN, Disp: ,  Rfl: 3 .  glipiZIDE (GLUCOTROL) 5 MG tablet, TAKE 1 TABLET BY MOUTH TWICE DAILY BEFORE A MEAL, Disp: 180 tablet, Rfl: 1 .  losartan (COZAAR) 50 MG tablet, Take 1 tablet (50 mg total) by mouth daily., Disp: 90 tablet, Rfl: 1 .  metFORMIN (GLUCOPHAGE) 1000 MG tablet, TAKE 1 TABLET BY MOUTH TWICE DAILY WITH MEALS (Patient taking differently: Take 500 mg by mouth daily with breakfast. ), Disp: 60 tablet, Rfl: 0 .  Multiple Vitamins-Minerals (ONE-A-DAY 50 PLUS PO), Take by mouth., Disp: , Rfl:  .  pravastatin (PRAVACHOL) 40 MG tablet, TAKE 1 TABLET BY MOUTH EVERY DAY, Disp: 90 tablet, Rfl: 0 .  Probiotic Product (PRO-BIOTIC BLEND PO), Take 1 tablet by mouth daily. , Disp: , Rfl:  .  sitaGLIPtin (JANUVIA) 100 MG tablet, Take 1 tablet (100 mg total) by mouth daily., Disp: 90 tablet, Rfl: 1  Current Facility-Administered Medications:  .  0.9 %  sodium chloride infusion, 500 mL, Intravenous, Continuous, Gatha Mayer, MD  EXAM:  Vitals:   04/17/18 0944  BP: 118/72  Pulse: 90  Temp: 98.4 F (36.9 C)    Body mass index is 30.73 kg/m.  GENERAL: vitals reviewed and listed above, alert, oriented, appears well hydrated and in no acute distress  HEENT: atraumatic, conjunttiva clear, no obvious abnormalities on inspection of external nose and ears  NECK: no obvious masses on inspection  LUNGS: clear to auscultation bilaterally, no wheezes, rales or rhonchi, good  air movement  CV: HRRR, no peripheral edema  MS: moves all extremities without noticeable abnormality  PSYCH: pleasant and cooperative, no obvious depression or anxiety  ASSESSMENT AND PLAN:  Discussed the following assessment and plan:  Type 2 diabetes mellitus without complication, without long-term current use of insulin ([place]) - Plan: Ambulatory referral to diabetic education  Hyperlipidemia associated with type 2 diabetes mellitus ([place])  Hypertension associated with diabetes ([place])  DOE (dyspnea on exertion) - Plan:  Exercise Tolerance Test  -lengthy discussion - she prefers not to take medications and actually wanted to stop medications today - advised of risks/benefits and greatest risk is the uncontrolled diabetes right now -she may be able to get control of the diabetes on a very strict, healthy, low sugar mediterreanean or dash diet and that would also help her health over all - discussed what this would look like at length. Advised avoidance sweets, processed foods, simple starches, artificial foods. -she wants referral to diabetes educator -she is going to look into insurance and free clinics -she refuses insulin, lengthy discussion and she prefers to try adding [place] but may be too expensive - she still wants to try, actos may be the only cheap option, but she declined -stress test referral after discussion options, emergency precautions -follow up 3 months, sooner as needed -Patient advised to return or notify a doctor immediately if symptoms worsen or persist or new concerns arise.  Patient Instructions  BEFORE YOU LEAVE: -review health maintenance due - will do foot exam next visit -follow up: 3 months - come fasting for 8 hours  EAT a very healthy diet and get regular exercise (see below)  Try to increase the metformin to 1000mg  twice daily  Continue the glipizide  Add the Januvia. If this is too expensive we can try the actos - or insulin if you change your mind. Call us in 1 week to let us know.  Sent referral for stress test and for diabetes education.  We recommend the following healthy lifestyle for LIFE: 1) Small portions. But, make sure to get regular (at least 3 per day), healthy meals and small healthy snacks if needed.  2) Eat a healthy clean diet.   TRY TO EAT: -at least 5-7 servings of low sugar, colorful, and nutrient rich vegetables per day (not corn, potatoes or bananas.) -berries are the best choice if you wish to eat fruit (only eat small amounts if trying to reduce  weight)  -lean meets (fish, white meat of chicken or [place]) -vegan proteins for some meals - beans or tofu, whole grains, nuts and seeds -Replace bad fats with good fats - good fats include: fish, nuts and seeds, canola oil, olive oil -small amounts of low fat or non fat dairy -small amounts of100 % whole grains - check the lables -drink plenty of water  AVOID: -SUGAR, sweets, anything with added sugar, corn syrup or sweeteners - must read labels as even foods advertised as "healthy" often are loaded with sugar -if you must have a sweetener, small amounts of stevia may be best -sweetened beverages and artificially sweetened beverages -simple starches (rice, bread, potatoes, pasta, chips, etc - small amounts of 100% whole grains are ok) -red meat, pork, butter -fried foods, fast food, processed food, excessive dairy, eggs and coconut.  3)Get at least 150 minutes of sweaty aerobic exercise per week.  4)Reduce stress - consider counseling, meditation and relaxation to balance other aspects of your life.     Lucretia Kern,  DO

## 2018-04-17 ENCOUNTER — Ambulatory Visit (INDEPENDENT_AMBULATORY_CARE_PROVIDER_SITE_OTHER): Payer: Self-pay | Admitting: Family Medicine

## 2018-04-17 ENCOUNTER — Encounter: Payer: Self-pay | Admitting: Family Medicine

## 2018-04-17 VITALS — BP 118/72 | HR 90 | Temp 98.4°F | Ht 64.0 in | Wt 179.0 lb

## 2018-04-17 DIAGNOSIS — E1159 Type 2 diabetes mellitus with other circulatory complications: Secondary | ICD-10-CM

## 2018-04-17 DIAGNOSIS — I152 Hypertension secondary to endocrine disorders: Secondary | ICD-10-CM

## 2018-04-17 DIAGNOSIS — R0609 Other forms of dyspnea: Secondary | ICD-10-CM

## 2018-04-17 DIAGNOSIS — E785 Hyperlipidemia, unspecified: Secondary | ICD-10-CM

## 2018-04-17 DIAGNOSIS — E119 Type 2 diabetes mellitus without complications: Secondary | ICD-10-CM

## 2018-04-17 DIAGNOSIS — I10 Essential (primary) hypertension: Secondary | ICD-10-CM

## 2018-04-17 DIAGNOSIS — E1169 Type 2 diabetes mellitus with other specified complication: Secondary | ICD-10-CM

## 2018-04-17 MED ORDER — SITAGLIPTIN PHOSPHATE 100 MG PO TABS: 100.0000 mg | ORAL_TABLET | Freq: Every day | ORAL | 1 refills | Status: DC

## 2018-04-17 NOTE — Patient Instructions (Addendum)
BEFORE YOU LEAVE: -review health maintenance due - will do foot exam next visit -follow up: 3 months - come fasting for 8 hours  EAT a very healthy diet and get regular exercise (see below)  Try to increase the metformin to 1000mg  twice daily  Continue the glipizide  Add the Januvia. If this is too expensive we can try the actos - or insulin if you change your mind. Call us in 1 week to let us know.  Sent referral for stress test and for diabetes education.  We recommend the following healthy lifestyle for LIFE: 1) Small portions. But, make sure to get regular (at least 3 per day), healthy meals and small healthy snacks if needed.  2) Eat a healthy clean diet.   TRY TO EAT: -at least 5-7 servings of low sugar, colorful, and nutrient rich vegetables per day (not corn, potatoes or bananas.) -berries are the best choice if you wish to eat fruit (only eat small amounts if trying to reduce weight)  -lean meets (fish, white meat of chicken or [place]) -vegan proteins for some meals - beans or tofu, whole grains, nuts and seeds -Replace bad fats with good fats - good fats include: fish, nuts and seeds, canola oil, olive oil -small amounts of low fat or non fat dairy -small amounts of100 % whole grains - check the lables -drink plenty of water  AVOID: -SUGAR, sweets, anything with added sugar, corn syrup or sweeteners - must read labels as even foods advertised as "healthy" often are loaded with sugar -if you must have a sweetener, small amounts of stevia may be best -sweetened beverages and artificially sweetened beverages -simple starches (rice, bread, potatoes, pasta, chips, etc - small amounts of 100% whole grains are ok) -red meat, pork, butter -fried foods, fast food, processed food, excessive dairy, eggs and coconut.  3)Get at least 150 minutes of sweaty aerobic exercise per week.  4)Reduce stress - consider counseling, meditation and relaxation to balance other aspects of your  life.

## 2018-04-20 ENCOUNTER — Encounter: Payer: Self-pay | Attending: Family Medicine | Admitting: *Deleted

## 2018-04-20 DIAGNOSIS — E119 Type 2 diabetes mellitus without complications: Secondary | ICD-10-CM | POA: Insufficient documentation

## 2018-04-20 DIAGNOSIS — Z6830 Body mass index (BMI) 30.0-30.9, adult: Secondary | ICD-10-CM | POA: Insufficient documentation

## 2018-04-20 DIAGNOSIS — Z713 Dietary counseling and surveillance: Secondary | ICD-10-CM | POA: Insufficient documentation

## 2018-04-20 NOTE — Progress Notes (Signed)
Diabetes Self-Management Education  Visit Type: First/Initial  Appt. Start Time: 1115 Appt. End Time: 1215  04/20/2018  Hayley Fox, identified by name and date of birth, is a 62 y.o. female with a diagnosis of Diabetes: Type 2. Patient is aware of significant increase in A1c this past 3 months to 9.5%. She has cut out all starches, eating mostly vegetables and lean meat. She is not able to exercise due to leg and back pain after falling recently. She will not prick her finger and is afraid of any type of injection. She does not have insurance so needs low cost medications and treatment options.  ASSESSMENT  Height 5\' 4"  (1.626 m), weight 178 lb 4.8 oz (80.9 kg), last menstrual period 07/29/2011. Body mass index is 30.61 kg/m.  Diabetes Self-Management Education - 04/20/18 1111      Visit Information   Visit Type  First/Initial      Initial Visit   Diabetes Type  Type 2    Are you currently following a meal plan?  No    Are you taking your medications as prescribed?  Yes    Date Diagnosed  2 years ago      Psychosocial Assessment   Patient Belief/Attitude about Diabetes  Afraid   don't want to start insulin   Self-care barriers  Unsteady gait/risk for falls    Self-management support  Doctor's office    Other persons present  Patient    Patient Concerns  Nutrition/Meal planning;Weight Control;Glycemic Control    Special Needs  None    Preferred Learning Style  No preference indicated    Learning Readiness  Change in progress    How often do you need to have someone help you when you read instructions, pamphlets, or other written materials from your doctor or pharmacy?  1 - Never    What is the last grade level you completed in school?  12      Complications   Last HgB A1C per patient/outside source  9.5 %    How often do you check your blood sugar?  0 times/day (not testing)   don't want to prick her finger   Have you had a dilated eye exam in the past 12 months?  Yes     Are you checking your feet?  Yes    How many days per week are you checking your feet?  7      Dietary Intake   Breakfast  regular oatmeal with blueberries and Splenda OR Cheerios OR eggs with bacon bits and cheese    Lunch  salad with grilled chicken OR eats out and gets seafood often OR BBQ with slaw and vegetable    Snack (afternoon)  used to snack on Doritos or Cheetos, not anymore    Dinner  Glucerna OR was eating larger meals eating out    Beverage(s)  black coffee, diet tea, diet lemonade, water, diet soda,       Exercise   Exercise Type  ADL's   she fell on walking trail a couple of months ago, so not walking anymore     Patient Education   Previous Diabetes Education  No    Disease state   Factors that contribute to the development of diabetes    Nutrition management   Role of diet in the treatment of diabetes and the relationship between the three main macronutrients and blood glucose level;Carbohydrate counting;Food label reading, portion sizes and measuring food.;Reviewed blood glucose goals for pre  and post meals and how to evaluate the patients' food intake on their blood glucose level.    Physical activity and exercise   Role of exercise on diabetes management, blood pressure control and cardiac health.    Medications  Reviewed patients medication for diabetes, action, purpose, timing of dose and side effects.    Monitoring  Purpose and frequency of SMBG.;Identified appropriate SMBG and/or A1C goals.    Acute complications  Taught treatment of hypoglycemia - the 15 rule.    Chronic complications  Relationship between chronic complications and blood glucose control    Psychosocial adjustment  Role of stress on diabetes      Individualized Goals (developed by patient)   Nutrition  Follow meal plan discussed    Physical Activity  Exercise 3-5 times per week    Medications  take my medication as prescribed    Monitoring   Other (comment)   consider Libre CGM      Post-Education Assessment   Patient understands the diabetes disease and treatment process.  Demonstrates understanding / competency    Patient understands incorporating nutritional management into lifestyle.  Demonstrates understanding / competency    Patient undertands incorporating physical activity into lifestyle.  Demonstrates understanding / competency    Patient understands using medications safely.  Demonstrates understanding / competency    Patient understands monitoring blood glucose, interpreting and using results  Needs Review      Outcomes   Expected Outcomes  Demonstrated interest in learning. Expect positive outcomes    Future DMSE  PRN    Program Status  Completed       Individualized Plan for Diabetes Self-Management Training:   Learning Objective:  Patient will have a greater understanding of diabetes self-management. Patient education plan is to attend individual and/or group sessions per assessed needs and concerns.  I offered for her to give an insulin injection with saline so she would know how it felt, but she declined.    Plan:   Patient Instructions  Plan:   Aim for 2-3 Carb Choices per meal (30-45 grams)    Aim for 0-1 Carbs per snack if hungry   Include protein in moderation with your meals and snacks  Consider reading food labels for Total Carbohydrate of foods  Consider  increasing your activity level with Arm Chair exercises for 5-15 minutes daily as tolerated  Consider checking into Freestyle Libre Sensor to get blood sugar information without sticking your finger   Continue taking medication as directed by MD  Expected Outcomes:  Demonstrated interest in learning. Expect positive outcomes  Education material provided: Food label handouts, A1C conversion sheet, Meal plan card, Support group flyer and Carbohydrate counting sheet  If problems or questions, patient to contact team via:  Phone  Future DSME appointment: PRN

## 2018-04-20 NOTE — Patient Instructions (Signed)
Plan:   Aim for 2-3 Carb Choices per meal (30-45 grams)    Aim for 0-1 Carbs per snack if hungry   Include protein in moderation with your meals and snacks  Consider reading food labels for Total Carbohydrate of foods  Consider  increasing your activity level with Arm Chair exercises for 5-15 minutes daily as tolerated  Consider checking into Freestyle Libre Sensor to get blood sugar information without sticking your finger   Continue taking medication as directed by MD

## 2018-04-24 ENCOUNTER — Encounter: Payer: Self-pay | Admitting: Family Medicine

## 2018-04-26 ENCOUNTER — Other Ambulatory Visit: Payer: Self-pay | Admitting: Family Medicine

## 2018-04-30 ENCOUNTER — Telehealth: Payer: Self-pay

## 2018-04-30 NOTE — Telephone Encounter (Signed)
New message     Just an FYI. We have made several attempts to contact this patient including sending a letter to schedule or reschedule their EXERCISE TOLERANCE TEST . We will be removing the patient from the WQ.   Thank you

## 2018-05-14 ENCOUNTER — Ambulatory Visit: Payer: Self-pay | Admitting: Family Medicine

## 2018-05-15 ENCOUNTER — Other Ambulatory Visit: Payer: Self-pay | Admitting: Family Medicine

## 2018-07-19 ENCOUNTER — Encounter: Payer: Self-pay | Admitting: Family Medicine

## 2018-09-05 ENCOUNTER — Ambulatory Visit (INDEPENDENT_AMBULATORY_CARE_PROVIDER_SITE_OTHER): Payer: Self-pay | Admitting: Family Medicine

## 2018-09-05 ENCOUNTER — Other Ambulatory Visit: Payer: Self-pay | Admitting: *Deleted

## 2018-09-05 ENCOUNTER — Other Ambulatory Visit: Payer: Self-pay

## 2018-09-05 ENCOUNTER — Telehealth: Payer: Self-pay | Admitting: Family Medicine

## 2018-09-05 VITALS — BP 110/70 | HR 86 | Temp 98.3°F

## 2018-09-05 DIAGNOSIS — R059 Cough, unspecified: Secondary | ICD-10-CM

## 2018-09-05 DIAGNOSIS — R6889 Other general symptoms and signs: Secondary | ICD-10-CM

## 2018-09-05 DIAGNOSIS — R05 Cough: Secondary | ICD-10-CM

## 2018-09-05 LAB — POCT INFLUENZA A/B
INFLUENZA A, POC: NEGATIVE
Influenza B, POC: NEGATIVE

## 2018-09-05 MED ORDER — BENZONATATE 100 MG PO CAPS: 100.0000 mg | ORAL_CAPSULE | Freq: Three times a day (TID) | ORAL | 0 refills | Status: DC | PRN

## 2018-09-05 NOTE — Patient Instructions (Signed)
Person Under Monitoring Name: Hayley Fox  Location: [address] [place] [address]   Infection Prevention Recommendations for Individuals Confirmed to have, or Being Evaluated for, 2019 Novel Coronavirus (COVID-19) Infection Who Receive Care at Home  Individuals who are confirmed to have, or are being evaluated for, COVID-19 should follow the prevention steps below until a healthcare provider or local or state health department says they can return to normal activities.  Stay home except to get medical care You should restrict activities outside your home, except for getting medical care. Do not go to work, school, or public areas, and do not use public transportation or taxis.  Call ahead before visiting your doctor Before your medical appointment, call the healthcare provider and tell them that you have, or are being evaluated for, COVID-19 infection. This will help the healthcare providers office take steps to keep other people from getting infected. Ask your healthcare provider to call the local or state health department.  Monitor your symptoms Seek prompt medical attention if your illness is worsening (e.g., difficulty breathing). Before going to your medical appointment, call the healthcare provider and tell them that you have, or are being evaluated for, COVID-19 infection. Ask your healthcare provider to call the local or state health department.  Wear a facemask You should wear a facemask that covers your nose and mouth when you are in the same room with other people and when you visit a healthcare provider. People who live with or visit you should also wear a facemask while they are in the same room with you.  Separate yourself from other people in your home As much as possible, you should stay in a different room from other people in your home. Also, you should use a separate bathroom, if available.  Avoid sharing household items You should not  share dishes, drinking glasses, cups, eating utensils, towels, bedding, or other items with other people in your home. After using these items, you should wash them thoroughly with soap and water.  Cover your coughs and sneezes Cover your mouth and nose with a tissue when you cough or sneeze, or you can cough or sneeze into your sleeve. Throw used tissues in a lined trash can, and immediately wash your hands with soap and water for at least 20 seconds or use an alcohol-based hand rub.  Wash your [facility] your hands often and thoroughly with soap and water for at least 20 seconds. You can use an alcohol-based hand sanitizer if soap and water are not available and if your hands are not visibly dirty. Avoid touching your eyes, nose, and mouth with unwashed hands.   Prevention Steps for Caregivers and Household Members of Individuals Confirmed to have, or Being Evaluated for, COVID-19 Infection Being Cared for in the Home  If you live with, or provide care at home for, a person confirmed to have, or being evaluated for, COVID-19 infection please follow these guidelines to prevent infection:  Follow healthcare providers instructions Make sure that you understand and can help the patient follow any healthcare provider instructions for all care.  Provide for the patients basic needs You should help the patient with basic needs in the home and provide support for getting groceries, prescriptions, and other personal needs.  Monitor the patients symptoms If they are getting sicker, call his or her medical provider and tell them that the patient has, or is being evaluated for, COVID-19 infection. This will help the healthcare  providers office take steps to keep other people from getting infected. Ask the healthcare provider to call the local or state health department.  Limit the number of people who have contact with the patient  If possible, have only one caregiver for the  patient.  Other household members should stay in another home or place of residence. If this is not possible, they should stay  in another room, or be separated from the patient as much as possible. Use a separate bathroom, if available.  Restrict visitors who do not have an essential need to be in the home.  Keep older adults, very young children, and other sick people away from the patient Keep older adults, very young children, and those who have compromised immune systems or chronic health conditions away from the patient. This includes people with chronic heart, lung, or kidney conditions, diabetes, and cancer.  Ensure good ventilation Make sure that shared spaces in the home have good air flow, such as from an air conditioner or an opened window, weather permitting.  Wash your hands often  Wash your hands often and thoroughly with soap and water for at least 20 seconds. You can use an alcohol based hand sanitizer if soap and water are not available and if your hands are not visibly dirty.  Avoid touching your eyes, nose, and mouth with unwashed hands.  Use disposable paper towels to dry your hands. If not available, use dedicated cloth towels and replace them when they become wet.  Wear a facemask and gloves  Wear a disposable facemask at all times in the room and gloves when you touch or have contact with the patients blood, body fluids, and/or secretions or excretions, such as sweat, saliva, sputum, nasal mucus, vomit, urine, or feces.  Ensure the mask fits over your nose and mouth tightly, and do not touch it during use.  Throw out disposable facemasks and gloves after using them. Do not reuse.  Wash your hands immediately after removing your facemask and gloves.  If your personal clothing becomes contaminated, carefully remove clothing and launder. Wash your hands after handling contaminated clothing.  Place all used disposable facemasks, gloves, and other waste in a lined  container before disposing them with other household waste.  Remove gloves and wash your hands immediately after handling these items.  Do not share dishes, glasses, or other household items with the patient  Avoid sharing household items. You should not share dishes, drinking glasses, cups, eating utensils, towels, bedding, or other items with a patient who is confirmed to have, or being evaluated for, COVID-19 infection.  After the person uses these items, you should wash them thoroughly with soap and water.  Wash laundry thoroughly  Immediately remove and wash clothes or bedding that have blood, body fluids, and/or secretions or excretions, such as sweat, saliva, sputum, nasal mucus, vomit, urine, or feces, on them.  Wear gloves when handling laundry from the patient.  Read and follow directions on labels of laundry or clothing items and detergent. In general, wash and dry with the warmest temperatures recommended on the label.  Clean all areas the individual has used often  Clean all touchable surfaces, such as counters, tabletops, doorknobs, bathroom fixtures, toilets, phones, keyboards, tablets, and bedside tables, every day. Also, clean any surfaces that may have blood, body fluids, and/or secretions or excretions on them.  Wear gloves when cleaning surfaces the patient has come in contact with.  Use a diluted bleach solution (e.g., dilute bleach with  1 part bleach and 10 parts water) or a household disinfectant with a label that says EPA-registered for coronaviruses. To make a bleach solution at home, add 1 tablespoon of bleach to 1 quart (4 cups) of water. For a larger supply, add  cup of bleach to 1 gallon (16 cups) of water.  Read labels of cleaning products and follow recommendations provided on product labels. Labels contain instructions for safe and effective use of the cleaning product including precautions you should take when applying the product, such as wearing gloves or  eye protection and making sure you have good ventilation during use of the product.  Remove gloves and wash hands immediately after cleaning.  Monitor yourself for signs and symptoms of illness Caregivers and household members are considered close contacts, should monitor their health, and will be asked to limit movement outside of the home to the extent possible. Follow the monitoring steps for close contacts listed on the symptom monitoring form.   ? If you have additional questions, contact your local health department or call the epidemiologist on call at [phone] (available 24/7). ? This guidance is subject to change. For the most up-to-date guidance from [facility], please refer to their website: YouBlogs.pl     Person Under Monitoring Name: Hayley Fox  Location: [place] [address]   CORONAVIRUS DISEASE 2019 (COVID-19) Guidance for Persons Under Investigation You are being tested for the virus that causes coronavirus disease 2019 (COVID-19). Public health actions are necessary to ensure protection of your health and the health of others, and to prevent further spread of infection. COVID-19 is caused by a virus that can cause symptoms, such as fever, cough, and shortness of breath. The primary transmission from person to person is by coughing or sneezing. On July 19, 2018, the [place] announced a [employer] Emergency of International Concern and on July 20, 2018 the U.S. Department of Health and Human Services declared a public health emergency. If the virus that causesCOVID-19 spreads in the community, it could have severe public health consequences.  As a person under investigation for COVID-19, the [place] advises you to adhere to the following guidance until your test results are reported to you.  If your test result is positive, you will receive additional information from your provider and your local health department at that time.   Remain at home until you are cleared by your health provider or public health authorities.   Keep a log of visitors to your home using the form provided. Any visitors to your home must be aware of your isolation status.  If you plan to move to a new address or leave the county, notify the local health department in your county.  Call a doctor or seek care if you have an urgent medical need. Before seeking medical care, call ahead and get instructions from the provider before arriving at the medical office, clinic or hospital. Notify them that you are being tested for the virus that causes COVID-19 so arrangements can be made, as necessary, to prevent transmission to others in the healthcare setting. Next, notify the local health department in your county.  If a medical emergency arises and you need to call 911, inform the first responders that you are being tested for the virus that causes COVID-19. Next, notify the local health department in your county.  Adhere to all guidance set forth by the  [place] for [facility] of patients that is based on guidance from the Center for Disease Control and Prevention with suspected or confirmed COVID-19. It is provided with this guidance for Persons Under Investigation.  Your health and the health of our community are our top priorities. Public Health officials remain available to provide assistance and counseling to you about COVID-19 and compliance with this guidance.  Provider: ____________________________________________________________ Date: ______/_____/_________  By signing below, you acknowledge that you have read and agree to comply with this Guidance for Persons Under Investigation. ______________________________________________________________ Date:  ______/_____/_________  WHO DO I CALL? You can find a list of local health departments here: [email or web address] Health Department: ____________________________________________________________________ Contact Name: ________________________________________________________________________ Telephone: ___________________________________________________________________________  Marice Potter, [place], Communicable Disease Branch COVID-19 Guidance for Persons Under Investigation August 25, 2018  Person Under Monitoring Name: Hayley Fox  Location: [place] [address]   Record here the list of visitors to your home since you became ill with respiratory symptoms that led you to consult a health provider:  Visitor Name Date Time In Time Out Did this person come within 6 feet of you? Indicate Y or N Relationship to Person Under Monitoring Phone number Comments   ___/____/____ __:__ AM/PM __:__ AM/PM       ___/____/____ __:__ AM/PM __:__ AM/PM       ___/____/____ __:__ AM/PM __:__ AM/PM       ___/____/____ __:__ AM/PM __:__ AM/PM       ___/____/____ __:__ AM/PM __:__ AM/PM       ___/____/____ __:__ AM/PM __:__ AM/PM       ___/____/____ __:__ AM/PM __:__ AM/PM       ___/____/____ __:__ AM/PM __:__ AM/PM       ___/____/____ __:__ AM/PM __:__ AM/PM       ___/____/____ __:__ AM/PM __:__ AM/PM       ___/____/____ __:__ AM/PM __:__ AM/PM       ___/____/____ __:__ AM/PM __:__ AM/PM       ___/____/____ __:__ AM/PM __:__ AM/PM       ___/____/____ __:__ AM/PM __:__ AM/PM       Marice Potter, [place], Communicable Disease Branch

## 2018-09-05 NOTE — Progress Notes (Signed)
  Subjective:     Patient ID: Hayley Fox, female   DOB: 1955/09/03, 63 y.o.   MRN: [id number]  HPI Pt seen with 2-3 days hx of cough.  Temp of 99.2 earlier today.   Subjective fever.  No travels.  Mild body aches.  Son and daughter have had similar symptoms.  No rhinorrhea. No sore throat. No significant dyspnea.   Nonsmoker  Mild myalgia.  No headaches.    Past Medical History:  Diagnosis Date  . Actinic keratosis 03/15/2012  . Anemia   . Blood transfusion without reported diagnosis   . Childhood asthma    resolved  . Diabetes mellitus without complication ([place])   . Hyperlipidemia   . Hypertension   . 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 CESAREAN SECTION    . COLONOSCOPY  12/2016   negative screening    reports that she has quit smoking. She has never used smokeless tobacco. She reports current alcohol use. She reports that she does not use drugs. family history includes Diabetes in her father; Hypertension in her father and mother. No Known Allergies   Review of Systems  Constitutional: Positive for fatigue and fever. Negative for chills.  HENT: Negative for congestion and sore throat.   Respiratory: Positive for cough. Negative for shortness of breath and wheezing.   Cardiovascular: Negative for chest pain.  Neurological: Negative for dizziness and headaches.       Objective:   Physical Exam Neck:     Musculoskeletal: Neck supple.     Vascular: No carotid bruit.  Cardiovascular:     Rate and Rhythm: Normal rate and regular rhythm.  Pulmonary:     Effort: Pulmonary effort is normal.     Breath sounds: Normal breath sounds. No wheezing or rales.  Lymphadenopathy:     Cervical: No cervical adenopathy.  Neurological:     Mental Status: She is alert.        Assessment:     Cough and fever.  Flu screen is negative.    Plan:     -pt will proceed with  Coronavirus screen (cough, subjective fever, sick contacts) -refilled Tessalon perles for as needed use for cough -pt advised regarding contact isolation (home isolation guidelines).Eulas Post MD [facility]

## 2018-09-05 NOTE — Telephone Encounter (Signed)
Questions for Screening COVID-19  Symptom onset:  Cough x 2-3 days Itchy/scratchy throat 99.2 temp - low grade Body aches - mostly in legs  Travel or Contacts:  Contact with multiple sick people  During this illness, did/does the patient experience any of the following symptoms? Fever >100.25F []   Yes [x]   No []   Unknown Subjective fever (felt feverish) [x]   Yes []   No []   Unknown Chills [x]   Yes []   No []   Unknown Muscle aches (myalgia) [x]   Yes []   No []   Unknown Runny nose (rhinorrhea) []   Yes [x]   No []   Unknown Sore throat []   Yes [x]   No []   Unknown Cough (new onset or worsening of chronic cough) [x]   Yes []   No []   Unknown Shortness of breath (dyspnea) []   Yes []   No [x]   Unknown Nausea or vomiting [x]   Yes []   No []   Unknown Headache []   Yes [x]   No []   Unknown Abdominal pain  []   Yes [x]   No []   Unknown Diarrhea (?3 loose/looser than normal stools/24hr period) []   Yes [x]   No []   Unknown Other, specify:  Patient risk factors: Smoker? []   Current []   Former [x]   Never If female, currently pregnant? []   Yes [x]   No  Patient Active Problem List   Diagnosis Date Noted  . Diabetes ([place]) 10/09/2015  . Hyperlipemia 10/09/2015  . Allergic rhinitis 04/18/2013  . Essential hypertension 12/03/2008    Plan:  []   High risk for COVID-19 with red flags go to ED (with CP, SOB, weak/lightheaded, or fever > 101.5). Call ahead.  [x]   High risk for COVID-19 but stable will have car visit. Inform provider and coordinate time. Will be completed in afternoon. []   No red flags but URI signs or symptoms will go through side door and be seen in dedicated room.  Note: Referral to telemedicine is an appropriate alternative disposition for higher risk but stable. [facility]/e-Visit: [phone].

## 2018-09-06 ENCOUNTER — Other Ambulatory Visit: Payer: Self-pay | Admitting: Family Medicine

## 2018-09-11 LAB — NOVEL CORONAVIRUS, NAA: SARS-CoV-2, NAA: NOT DETECTED

## 2018-10-16 ENCOUNTER — Ambulatory Visit (INDEPENDENT_AMBULATORY_CARE_PROVIDER_SITE_OTHER): Payer: BLUE CROSS/BLUE SHIELD | Admitting: Family Medicine

## 2018-10-16 ENCOUNTER — Other Ambulatory Visit: Payer: Self-pay

## 2018-10-16 DIAGNOSIS — E669 Obesity, unspecified: Secondary | ICD-10-CM

## 2018-10-16 DIAGNOSIS — G8929 Other chronic pain: Secondary | ICD-10-CM

## 2018-10-16 DIAGNOSIS — E785 Hyperlipidemia, unspecified: Secondary | ICD-10-CM | POA: Diagnosis not present

## 2018-10-16 DIAGNOSIS — M5441 Lumbago with sciatica, right side: Secondary | ICD-10-CM

## 2018-10-16 DIAGNOSIS — E119 Type 2 diabetes mellitus without complications: Secondary | ICD-10-CM

## 2018-10-16 NOTE — Progress Notes (Signed)
Virtual Visit via Video Note  I connected with Hayley Fox  on 10/16/18 at 10:00 AM EDT by a video enabled telemedicine application and verified that I am speaking with the correct person using two identifiers.  Location patient: home Location provider:work or home office Persons participating in the virtual visit: patient, provider  I discussed the limitations of evaluation and management by telemedicine and the availability of in person appointments. The patient expressed understanding and agreed to proceed.   HPI:  Hayley Fox is a pleasant 63 yo with a PMH significant for poorly controlled diabetes, complicated by HTN, HLD and poor compliance. Communicated with her via virtually today in light of the COVID 19 pandemic for an acute concern of:  Low back Pain: -for a few weeks, but has had in the past with hx sciatica -r sided, worse at night, better with stretching - sometimes better with stretching -cbd and biofreeze also help - pain is sharp and burning at times, sometimes moderate to severe -sometimes radiates down the R leg on the lateral leg -no fevers, weakness, malaise, bowel or bladder issues -reports does not check blood sugar as does not want to stick herself, taking her medications -reports can not take gabapentin as it makes her feel itchy   Cough: -had a cold in March -has had allergies, itchy nose, sneezing -doing much better though, no fevers or SOB  ROS: See pertinent positives and negatives per HPI.  Past Medical History:  Diagnosis Date  . Actinic keratosis 03/15/2012  . Anemia   . Blood transfusion without reported diagnosis   . Childhood asthma    resolved  . Diabetes mellitus without complication ([place])   . Hyperlipidemia   . Hypertension   . 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 CESAREAN SECTION    . COLONOSCOPY  12/2016   negative screening     Family History  Problem Relation Age of Onset  . Diabetes Father   . Hypertension Father   . Hypertension Mother   . Colon cancer Neg Hx     SOCIAL HX: see hpi   Current Outpatient Medications:  .  amLODipine (NORVASC) 10 MG tablet, TAKE 1 TABLET BY MOUTH EVERY DAILY, Disp: 30 tablet, Rfl: 5 .  glipiZIDE (GLUCOTROL) 5 MG tablet, TAKE 1 TABLET BY MOUTH TWICE DAILY BEFORE A MEAL, Disp: 180 tablet, Rfl: 1 .  losartan (COZAAR) 50 MG tablet, Take 1 tablet (50 mg total) by mouth daily., Disp: 90 tablet, Rfl: 1 .  metFORMIN (GLUCOPHAGE) 1000 MG tablet, TAKE 1 TABLET BY MOUTH TWICE DAILY WITH MEALS, Disp: 60 tablet, Rfl: 5 .  Multiple Vitamins-Minerals (ONE-A-DAY 50 PLUS PO), Take by mouth., Disp: , Rfl:  .  pravastatin (PRAVACHOL) 40 MG tablet, TAKE 1 TABLET BY MOUTH EVERY DAY, Disp: 90 tablet, Rfl: 0 .  Probiotic Product (PRO-BIOTIC BLEND PO), Take 1 tablet by mouth daily. , Disp: , Rfl:  .  sitaGLIPtin (JANUVIA) 100 MG tablet, Take 1 tablet (100 mg total) by mouth daily., Disp: 90 tablet, Rfl: 1  Current Facility-Administered Medications:  .  0.9 %  sodium chloride infusion, 500 mL, Intravenous, Continuous, Gatha Mayer, MD  EXAM:  VITALS per patient if applicable: none reported  GENERAL: alert, oriented, appears well and in no acute distress  HEENT: atraumatic, conjunttiva clear, no obvious abnormalities on inspection of external nose and ears  NECK: normal movements  of the head and neck  LUNGS: on inspection no signs of respiratory distress, breathing rate appears normal, no obvious gross SOB, gasping or wheezing  CV: no obvious cyanosis  MS: moves all visible extremities without noticeable abnormality, gait is normal, she is able to bend over, she points to the lateral upper R thigh and down as the area hurting right now, but points to sciatic distribution as area that hurst when bad. She does have some pain in the upper lateral thigh with the seated straightt leg raise -  but does not radiate.  PSYCH/NEURO: pleasant and cooperative, no obvious depression or anxiety, speech and thought processing grossly intact  ASSESSMENT AND PLAN:  Discussed the following assessment and plan:  Chronic right-sided low back pain with right-sided sciatica -we discussed possible serious and likely etiologies, workup and treatment, treatment risks and return precautions. Pt prefers to avoid OVs as possible in light of the COVID 19 pandemic. Likely radicular pain vs other. Discussed risks/benefits of various options. -after this discussion, Kaziah opted for HEP, tylenol in safe dosing (discussed not more the 500mg  tid prn for pain), close follow up, consider pmr referral if not improving -of course, we advised Sanvika  to return or notify a doctor immediately if symptoms worsen or persist or new concerns arise.  Type 2 diabetes mellitus without complication, without long-term current use of insulin (HCC) -needs labs as does not want to stick herself, reports now has insurance and will get her in with endo if persistently poor controlled diabetes  Hyperlipidemia, unspecified hyperlipidemia type Obesity with serious comorbidity, unspecified classification, unspecified obesity type -discussed risks of persistently poorly controlled chronic disease. Follow up soon to discuss further after labs.    I discussed the assessment and treatment plan with the patient. The patient was provided an opportunity to ask questions and all were answered. The patient agreed with the plan and demonstrated an understanding of the instructions.   The patient was advised to call back or seek an in-person evaluation if the symptoms worsen or if the condition fails to improve as anticipated.    Follow up instructions: Advised assistant Wendie Simmer to help patient arrange the following: -need to update insurance, pt reports now has insurance -send sciatica home exercises to her via mail -set up lab visit this  week -set up virtual follow up with Dr. Maudie Mercury in 2-4 weeks -set of meet and greet with Dr. Ethlyn Gallery in 3-4 months   Lucretia Kern, DO

## 2018-10-18 ENCOUNTER — Other Ambulatory Visit: Payer: Self-pay | Admitting: Family Medicine

## 2018-10-18 ENCOUNTER — Other Ambulatory Visit: Payer: Self-pay

## 2018-10-18 ENCOUNTER — Other Ambulatory Visit: Payer: BLUE CROSS/BLUE SHIELD

## 2018-10-18 LAB — BASIC METABOLIC PANEL
BUN: 18 mg/dL (ref 6–23)
CO2: 27 mEq/L (ref 19–32)
Calcium: 9.2 mg/dL (ref 8.4–10.5)
Chloride: 102 mEq/L (ref 96–112)
Creatinine, Ser: 0.7 mg/dL (ref 0.40–1.20)
GFR: 102.28 mL/min (ref >60.00)
Glucose, Bld: 162 mg/dL — ABNORMAL HIGH (ref 70–99)
Potassium: 4.5 mEq/L (ref 3.5–5.1)
Sodium: 139 mEq/L (ref 135–145)

## 2018-10-18 LAB — LIPID PANEL
Cholesterol: 191 mg/dL (ref 0–200)
HDL: 55.1 mg/dL (ref >39.00)
LDL Cholesterol: 107 mg/dL — ABNORMAL HIGH (ref 0–99)
NonHDL: 136.03
Total CHOL/HDL Ratio: 3
Triglycerides: 144 mg/dL (ref 0.0–149.0)
VLDL: 28.8 mg/dL (ref 0.0–40.0)

## 2018-10-18 LAB — HEMOGLOBIN A1C: Hgb A1c MFr Bld: 9.8 % — ABNORMAL HIGH (ref 4.6–6.5)

## 2018-10-25 ENCOUNTER — Other Ambulatory Visit: Payer: Self-pay

## 2018-10-25 ENCOUNTER — Ambulatory Visit (INDEPENDENT_AMBULATORY_CARE_PROVIDER_SITE_OTHER): Payer: BLUE CROSS/BLUE SHIELD | Admitting: Family Medicine

## 2018-10-25 DIAGNOSIS — E785 Hyperlipidemia, unspecified: Secondary | ICD-10-CM

## 2018-10-25 DIAGNOSIS — M545 Low back pain, unspecified: Secondary | ICD-10-CM

## 2018-10-25 DIAGNOSIS — E119 Type 2 diabetes mellitus without complications: Secondary | ICD-10-CM | POA: Diagnosis not present

## 2018-10-25 MED ORDER — ROSUVASTATIN CALCIUM 10 MG PO TABS: 10.0000 mg | ORAL_TABLET | Freq: Every day | ORAL | 1 refills | Status: DC

## 2018-10-25 NOTE — Progress Notes (Addendum)
Virtual Visit via Video Note  I connected with Hayley Fox  on 10/25/18 at 11:00 AM EDT by a video enabled telemedicine application and verified that I am speaking with the correct person using two identifiers. She was not able to utilized the video portion of the call at this time so the visit was completed successfully via audio.  Location patient: home Location provider:work or home office Persons participating in the virtual visit: patient, provider  I discussed the limitations of evaluation and management by telemedicine and the availability of in person appointments. The patient expressed understanding and agreed to proceed.   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  Past Medical History:  Diagnosis Date  . Actinic keratosis 03/15/2012  . Anemia   . Blood transfusion without reported diagnosis   . Childhood asthma    resolved  . Diabetes mellitus without complication ([place])   . Hyperlipidemia   . Hypertension   . 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 CESAREAN SECTION    . COLONOSCOPY  12/2016   negative screening     Family History  Problem Relation Age of Onset  . Diabetes Father   . Hypertension Father   . Hypertension Mother   . Colon cancer Neg Hx     SOCIAL HX: see hpi   Current Outpatient Medications:  .  amLODipine (NORVASC) 10 MG tablet, TAKE 1 TABLET BY MOUTH EVERY DAILY, Disp: 30 tablet, Rfl: 5 .  glipiZIDE (GLUCOTROL) 5 MG tablet, TAKE 1 TABLET BY MOUTH TWICE DAILY BEFORE A MEAL, Disp: 180 tablet, Rfl: 1 .  losartan (COZAAR) 50 MG tablet, TAKE 1 TABLET BY MOUTH DAILY, Disp: 90 tablet, Rfl: 1 .  metFORMIN (GLUCOPHAGE) 1000 MG tablet, TAKE 1 TABLET BY MOUTH TWICE DAILY WITH MEALS, Disp: 60 tablet, Rfl: 5 .  Multiple Vitamins-Minerals (ONE-A-DAY 50 PLUS PO), Take by mouth., Disp: , Rfl:  .  Probiotic Product (PRO-BIOTIC BLEND PO), Take 1 tablet by mouth daily. , Disp: , Rfl:  .  rosuvastatin (CRESTOR) 10 MG tablet, Take 1 tablet (10 mg total) by mouth daily., Disp: 90 tablet, Rfl: 1 .  sitaGLIPtin (JANUVIA) 100 MG tablet, Take 1 tablet (100 mg total) by mouth daily., Disp: 90 tablet, Rfl: 1  Current Facility-Administered Medications:  .  0.9 %  sodium chloride infusion, 500 mL, Intravenous, Continuous, Gatha Mayer, MD  EXAM:  VITALS per patient if applicable:reports BP is good  GENERAL: alert, oriented, no audible signs of acute distress  LUNGS: no audible signs of resp distress  PSYCH/NEURO: pleasant and cooperative, no obvious depression or anxiety, speech and thought processing grossly intact  ASSESSMENT AND PLAN:  Discussed the following assessment and plan:  Type 2 diabetes mellitus without complication, without long-term current use of insulin ([place]) -discussed implications and risks -she really feels diet has had a big impact and is motivated to cut out sweets and eat healthier -will also have her see endo for medication management - she is ok with glucose pump if needed, but otherwise does not want any repeated injections or sticks -foot exam at next in office  visit  Hyperlipidemia, unspecified hyperlipidemia type -opted to stop pravastatin and start crestor, discussed risks/benefits -follow up and lipid recheck in 3 months  Bilateral low back pain, unspecified chronicity, unspecified whether sciatica present -so glad back issues resolved -advised to continue exercises and call if any recurrence    I discussed the assessment and treatment plan with the patient. The patient was provided an opportunity to ask questions and all were answered. The patient agreed with the plan and demonstrated an understanding of the instructions.   The patient was advised to call back or seek an in-person evaluation if the symptoms worsen or if the condition fails to improve as anticipated.   Follow up instructions: Advised assistant Wendie Simmer to help patient arrange the following: -change visit with Dr. Ethlyn Gallery to CPE with foot exam/pap; ok with Dr. Ethlyn Gallery and pt -follow up with me diabetes/htn, hld in 3-4 months - fasting lipid panel 1 week prior   Lucretia Kern, DO

## 2018-10-30 ENCOUNTER — Ambulatory Visit: Payer: Self-pay | Admitting: Family Medicine

## 2018-11-06 ENCOUNTER — Ambulatory Visit (INDEPENDENT_AMBULATORY_CARE_PROVIDER_SITE_OTHER): Payer: BLUE CROSS/BLUE SHIELD | Admitting: Internal Medicine

## 2018-11-06 ENCOUNTER — Encounter: Payer: Self-pay | Admitting: Internal Medicine

## 2018-11-06 ENCOUNTER — Other Ambulatory Visit: Payer: Self-pay

## 2018-11-06 DIAGNOSIS — E1165 Type 2 diabetes mellitus with hyperglycemia: Secondary | ICD-10-CM

## 2018-11-06 MED ORDER — FREESTYLE LIBRE 14 DAY SENSOR MISC: 1.0000 | 6 refills | Status: DC

## 2018-11-06 MED ORDER — METFORMIN HCL ER 500 MG PO TB24: 500.0000 mg | ORAL_TABLET | Freq: Two times a day (BID) | ORAL | 6 refills | Status: DC

## 2018-11-06 MED ORDER — GLIPIZIDE 5 MG PO TABS: ORAL_TABLET | ORAL | 3 refills | Status: DC

## 2018-11-06 MED ORDER — FREESTYLE LIBRE 14 DAY READER DEVI: 1.0000 | 0 refills | Status: DC

## 2018-11-06 NOTE — Progress Notes (Signed)
Virtual Visit via Video Note  I connected with Okey Dupre on 11/06/18 at  2:00 PM EDT by a video enabled telemedicine application and verified that I am speaking with the correct person using two identifiers.   I discussed the limitations of evaluation and management by telemedicine and the availability of in person appointments. The patient expressed understanding and agreed to proceed.  -Location of the patient : Home  -Location of the provider : office  -The names of all persons participating in the telemedicine service : Pt and myself     Name: Hayley Fox  MRN/ DOB: [id number], December 05, 1955   Age/ Sex: 63 y.o., female    PCP: Lucretia Kern, DO   Reason for Endocrinology Evaluation: Type 2 Diabetes Mellitus     Date of Initial Endocrinology Visit: 11/06/2018     PATIENT IDENTIFIER: Ms. Hayley Fox is a 63 y.o. female with a past medical history of HTN, Dyslipidemia and T2DM. The patient presented for initial endocrinology clinic visit on 11/06/2018 for consultative assistance with her diabetes management.    HPI: Ms. Allor was    Diagnosed with T2DM in 2016 Prior Medications tried/Intolerance: as listed  Currently checking blood sugars 0 x / day.  Hypoglycemia episodes : no            Hemoglobin A1c has ranged from 6.9% in 2017, peaking at 9.8% in 2020. Patient required assistance for hypoglycemia: no Patient has required hospitalization within the last 1 year from hyper or hypoglycemia: no  In terms of diet, the patient avoids sugar-sweetened beverages, eats 2-3 meals a day, snacks between the meals.    HOME DIABETES REGIMEN: Metformin 1000 mg BID- takes once a day  Glipizide 5 mg BID - skips 1-2x a month  Januvia 100 mg daily    Statin: yes ACE-I/ARB: yes Prior Diabetic Education: yes   GLUCOSE READING: n/a   DIABETIC COMPLICATIONS: Microvascular complications:    Denies: CKD, retinopathy , neuropathy  Last eye exam: Completed 2019  Macrovascular  complications:    Denies: CAD, PVD, CVA   PAST HISTORY: Past Medical History:  Past Medical History:  Diagnosis Date  . Actinic keratosis 03/15/2012  . Anemia   . Blood transfusion without reported diagnosis   . Childhood asthma    resolved  . Diabetes mellitus without complication ([place])   . Hyperlipidemia   . Hypertension   . 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 CESAREAN SECTION    . COLONOSCOPY  12/2016   negative screening      Social History:  reports that she has quit smoking. She has never used smokeless tobacco. She reports current alcohol use. She reports that she does not use drugs. Family History:  Family History  Problem Relation Age of Onset  . Diabetes Father   . Hypertension Father   . Hypertension Mother   . Colon cancer Neg Hx       HOME MEDICATIONS: Allergies as of 11/06/2018   No Known Allergies     Medication List       Accurate as of Nov 06, 2018  1:40 PM. If you have any questions, ask your nurse or doctor.        amLODipine 10 MG tablet Commonly known as:  NORVASC TAKE 1 TABLET BY MOUTH EVERY DAILY   glipiZIDE 5 MG tablet Commonly known as:  GLUCOTROL TAKE 1 TABLET BY MOUTH TWICE DAILY BEFORE A MEAL   losartan 50 MG tablet Commonly known as:  COZAAR TAKE 1 TABLET BY MOUTH DAILY   metFORMIN 1000 MG tablet Commonly known as:  GLUCOPHAGE TAKE 1 TABLET BY MOUTH TWICE DAILY WITH MEALS   ONE-A-DAY 50 PLUS PO Take by mouth.   PRO-BIOTIC BLEND PO Take 1 tablet by mouth daily.   rosuvastatin 10 MG tablet Commonly known as:  Crestor Take 1 tablet (10 mg total) by mouth daily.   sitaGLIPtin 100 MG tablet Commonly known as:  Januvia Take 1 tablet (100 mg total) by mouth daily.        ALLERGIES: No Known Allergies   REVIEW OF SYSTEMS: A comprehensive ROS was conducted with the patient and is  negative except as per HPI and below:  Review of Systems  Constitutional: Negative for fever and weight loss.  HENT: Negative for congestion and sore throat.   Eyes: Negative for blurred vision and pain.  Respiratory: Negative for cough and shortness of breath.   Cardiovascular: Negative for chest pain and palpitations.  Gastrointestinal: Negative for diarrhea and nausea.  Genitourinary: Negative for frequency.  Neurological: Negative for tingling and tremors.  Endo/Heme/Allergies: Negative for polydipsia.  Psychiatric/Behavioral: Negative for depression. The patient is not nervous/anxious.         DATA REVIEWED:  Lab Results  Component Value Date   HGBA1C 9.8 (H) 10/18/2018   HGBA1C 9.5 (H) 04/10/2018   HGBA1C 8.3 (H) 04/21/2017   Lab Results  Component Value Date   MICROALBUR 1.5 07/11/2016   LDLCALC 107 (H) 10/18/2018   CREATININE 0.70 10/18/2018   Lab Results  Component Value Date   MICRALBCREAT 1.2 07/11/2016    Lab Results  Component Value Date   CHOL 191 10/18/2018   HDL 55.10 10/18/2018   LDLCALC 107 (H) 10/18/2018   TRIG 144.0 10/18/2018   CHOLHDL 3 10/18/2018        ASSESSMENT / PLAN / RECOMMENDATIONS:   1) Type 2 Diabetes Mellitus, Poorly controlled, Without complications - Most recent A1c of 9.8 %. Goal A1c < 7.0 %.    Plan: GENERAL:  Poorly controlled diabetes is due to continued dietary indiscretions, there's probably some medication non-adherence as well.  I have discussed with the patient the pathophysiology of diabetes. We went over the natural progression of the disease. We talked about both insulin resistance and insulin deficiency. We stressed the importance of lifestyle changes including diet and exercise. I explained the complications associated with diabetes including retinopathy, nephropathy, neuropathy as well as increased risk of cardiovascular disease. We went over the benefit seen with glycemic control.    I explained to the patient  that diabetic patients are at higher than normal risk for amputations. The patient was informed that diabetes is the number one cause of non-traumatic amputations in [place].  We discussed the importance of glucose checks at home and the availability of this data to me to be able to make proper management decision but pt declines, stating she is scared of needles, she is interested in freestyle [place], a prescription was sent to her local pharmacy.   We discussed the importance of low CHO diet and avoiding snacks when possible, we discussed options for low cab snacks.   She is intolerant to 1000 mg metformin , will change to XR as they are better tolerable.     MEDICATIONS:  Change to Metformin 500 mg XR BID with meals   Increase Glipizide  to 10 mg with Breakfast   Continue Glipizide 5 mg with Supper   Continue Januvia 100 mg daily   EDUCATION / INSTRUCTIONS:  BG monitoring instructions: Patient is instructed to check her blood sugars 2 times a day, fasting and bedtime .  Call [facility] if: BG persistently < 70 or > 300. . I reviewed the Rule of 15 for the treatment of hypoglycemia in detail with the patient. Literature supplied.   2) Diabetic complications:   Eye: Does not have known diabetic retinopathy.   Neuro/ Feet: Does not have known diabetic peripheral neuropathy.  Renal: Patient does not have known baseline CKD. She is on an ACEI/ARB at present. Check urine albumin/creatinine ratio yearly starting at time of diagnosis. If albuminuria is positive, treatment is geared toward better glucose, blood pressure control and use of ACE inhibitors or ARBs. Monitor electrolytes and creatinine once to twice yearly.   3) Lipids: Patient is on Crestor daily    4) Hypertension: Historically she has been at goal of < 140/90 mmHg.     I discussed the assessment and treatment plan with the patient. The patient was provided an opportunity to ask questions and all  were answered. The patient agreed with the plan and demonstrated an understanding of the instructions.   The patient was advised to call back or seek an in-person evaluation if the symptoms worsen or if the condition fails to improve as anticipated.  F/u in 3 months     Signed electronically by: Mack Guise, MD  [facility]  [facility] [address] [place], [place] [address] Phone: [phone] FAX: [phone]   CC: Lucretia Kern, DO [place] [address] Phone: [phone] Fax: [phone]   Return to Endocrinology clinic as below: Future Appointments  Date Time Provider [place]  11/06/2018  2:00 PM Iren Whipp, Melanie Crazier, MD [facility] None  12/07/2018  2:30 PM Caren Macadam, MD [facility]  01/17/2019  8:00 AM [facility]  01/24/2019 10:00 AM Lucretia Kern, DO [facility]

## 2018-11-16 ENCOUNTER — Telehealth: Payer: Self-pay | Admitting: Internal Medicine

## 2018-11-16 NOTE — Telephone Encounter (Signed)
Pt will need pt education for freestyle libre and an appt possible to come in to speak to you.

## 2018-11-16 NOTE — Telephone Encounter (Signed)
Patient states she was told by Dr. Kelton Pillar to call our office when she received her [place] monitor. Patient has received the above meter. Please call patient at ph# [phone] to advise next step.

## 2018-11-20 NOTE — Telephone Encounter (Signed)
Thank you :)

## 2018-11-20 NOTE — Telephone Encounter (Signed)
Appointment made for tomorrow

## 2018-11-21 ENCOUNTER — Encounter: Payer: BLUE CROSS/BLUE SHIELD | Attending: Internal Medicine | Admitting: Nutrition

## 2018-11-21 ENCOUNTER — Other Ambulatory Visit: Payer: Self-pay

## 2018-11-21 DIAGNOSIS — E119 Type 2 diabetes mellitus without complications: Secondary | ICD-10-CM

## 2018-11-22 NOTE — Patient Instructions (Signed)
Scan sensor at least every 8 hours Read over manual and handouts.

## 2018-11-22 NOTE — Progress Notes (Signed)
We discussed the difference between a blood sugar, and a sensor reading.  She reported good understanding of this.  She was shown how to set up the sensor, prepare the skin,and different sites to use for this.  She preferred that the readings go to her reader, and not her phone.  So we set up the reader with date/time and she was shown how to start the sensor and do a reading.  She attached the sensor to her right inner arm, and she started the sensor appropriately.  She was encouraged to scan at least every 8 hours to get the full 24 hour view of her blood sugar readings.   She agreed to do this.  She was encourage to read over the handouts and manual.  She had no final questions.

## 2018-12-05 ENCOUNTER — Other Ambulatory Visit: Payer: Self-pay | Admitting: Family Medicine

## 2018-12-07 ENCOUNTER — Ambulatory Visit (INDEPENDENT_AMBULATORY_CARE_PROVIDER_SITE_OTHER): Payer: BLUE CROSS/BLUE SHIELD | Admitting: Family Medicine

## 2018-12-07 ENCOUNTER — Encounter: Payer: Self-pay | Admitting: Family Medicine

## 2018-12-07 ENCOUNTER — Other Ambulatory Visit: Payer: Self-pay

## 2018-12-07 DIAGNOSIS — I10 Essential (primary) hypertension: Secondary | ICD-10-CM | POA: Diagnosis not present

## 2018-12-07 DIAGNOSIS — E785 Hyperlipidemia, unspecified: Secondary | ICD-10-CM

## 2018-12-07 DIAGNOSIS — E1165 Type 2 diabetes mellitus with hyperglycemia: Secondary | ICD-10-CM

## 2018-12-07 NOTE — Progress Notes (Signed)
Virtual Visit via Video Note  I connected with Hayley Fox   on 12/07/18 at  2:30 PM EDT by a video enabled telemedicine application and verified that I am speaking with the correct person using two identifiers.  Location patient: home Location provider:work office Persons participating in the virtual visit: patient, provider  I discussed the limitations of evaluation and management by telemedicine and the availability of in person appointments. The patient expressed understanding and agreed to proceed.   Hayley Fox DOB: 1955-08-01 Encounter date: 12/07/2018  This is a 63 y.o. female who presents to establish care. No chief complaint on file.  Last visit with HK was 10/25/18 History of present illness:  DMII:poor control; referred to endocrinology. Metformin (just changed to XR), januvia, glipizide (just increased to 10mg  AM, 5mg  w dinner). Saw Dr. Kelton Pillar and planned 3 mo follow up with changes. Blood sugar is 130 currently. She has been using her freestyle [place] which she really likes. Programs it so that information goes to endocrinologist. Highest sugars she sees are 230 or 260 (this is 15 minutes after eating).   GEX:BMWUXLK 10mg , losartan 50mg . Not checking blood pressure at home.   HL: stopped pravastatin and started crestor. (patient was still taking both medications today but we clarified that she should just be on crestor) Recheck lipids in August.   Anemia: last cbc was 04/2017. Can check with future bloodwork. This was more of issue when she was menstruating as periods used to be heavy.  Arthritis:low back pain had improved at last visit. Keeps sciatica away if she does regular exercises for this. Usually exercises 2-3 times/week which keeps leg and back from hurting - more stretches.    Past Medical History:  Diagnosis Date  . Actinic keratosis 03/15/2012  . Anemia   . Blood transfusion without reported diagnosis   . Childhood asthma    resolved  . Diabetes mellitus  without complication ([place])   . Hyperlipidemia   . Hypertension   . 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 CESAREAN SECTION    . COLONOSCOPY  12/2016   negative screening   No Known Allergies Current Meds  Medication Sig  . amLODipine (NORVASC) 10 MG tablet TAKE 1 TABLET BY MOUTH EVERY DAILY  . Continuous Blood Gluc Receiver (FREESTYLE LIBRE 14 DAY READER) DEVI 1 Device by Does not apply route as directed.  . Continuous Blood Gluc Sensor (FREESTYLE LIBRE 14 DAY SENSOR) MISC 1 Device by Does not apply route as directed.  Marland Kitchen glipiZIDE (GLUCOTROL) 5 MG tablet Take 2 tablets (10 mg total) by mouth daily before breakfast AND 1 tablet (5 mg total) daily with supper.  . losartan (COZAAR) 50 MG tablet TAKE 1 TABLET BY MOUTH DAILY  . metFORMIN (GLUCOPHAGE XR) 500 MG 24 hr tablet Take 1 tablet (500 mg total) by mouth 2 (two) times daily with a meal.  . Multiple Vitamins-Minerals (ONE-A-DAY 50 PLUS PO) Take by mouth.  . Probiotic Product (PRO-BIOTIC BLEND PO) Take 1 tablet by mouth daily.   . rosuvastatin (CRESTOR) 10 MG tablet Take 1 tablet (10 mg total) by mouth daily.  . sitaGLIPtin (JANUVIA) 100 MG tablet Take 1 tablet (100 mg total) by mouth daily.  . [DISCONTINUED] pravastatin (PRAVACHOL) 40 MG tablet TAKE 1 TABLET BY MOUTH EVERY DAY   Current Facility-Administered Medications for the 12/07/18 encounter (Office Visit) with Caren Macadam, MD  Medication  . 0.9 %  sodium chloride infusion   Social History   Tobacco Use  . Smoking status: Former Smoker    Years: 1.00    Types: Cigarettes  . Smokeless tobacco: Never Used  Substance Use Topics  . Alcohol use: Yes    Comment: socially-once a month per pt.   Family History  Problem Relation Age of Onset  . Diabetes Father   . Hypertension Father   . Hypertension Mother   . Alcohol abuse Mother   . Cirrhosis Mother   . Diabetes  Brother   . High blood pressure Brother   . High Cholesterol Brother   . Colon cancer Neg Hx      Review of Systems  Constitutional: Negative for chills, fatigue and fever.  Respiratory: Negative for cough, chest tightness, shortness of breath and wheezing.   Cardiovascular: Negative for chest pain, palpitations and leg swelling.    Objective:  LMP 07/29/2011       BP Readings from Last 3 Encounters:  09/05/18 110/70  04/17/18 118/72  04/10/18 140/80   Wt Readings from Last 3 Encounters:  04/20/18 178 lb 4.8 oz (80.9 kg)  04/17/18 179 lb (81.2 kg)  04/10/18 185 lb 11.2 oz (84.2 kg)    EXAM:  GENERAL: alert, oriented, appears well and in no acute distress  HEENT: atraumatic, conjunctiva clear, no obvious abnormalities on inspection of external nose and ears  NECK: normal movements of the head and neck  LUNGS: on inspection no signs of respiratory distress, breathing rate appears normal, no obvious gross SOB, gasping or wheezing  CV: no obvious cyanosis  MS: moves all visible extremities without noticeable abnormality  PSYCH/NEURO: pleasant and cooperative, no obvious depression or anxiety, speech and thought processing grossly intact  SKIN: no abnormalities noted face/neck.   Assessment/Plan  1. Essential hypertension Continue current medications. Has been well controlled.  - Comprehensive metabolic panel; Future - CBC with Differential/Platelet; Future  2. Controlled type 2 diabetes mellitus with hyperglycemia, without long-term current use of insulin ([place]) Working on being compliant with meds/checking sugars. Reviewed timing for checking sugar. She will follow upwith endo in August. Bloodwork scheduled already for July. - Hemoglobin A1c; Future - Microalbumin / creatinine urine ratio; Future  3. Hyperlipidemia, unspecified hyperlipidemia type crestor (stop pravastatin) - Lipid panel; Future  Return bloodwork July; has august visit scheduled with  HK.   Due for pap 04/2019. Due for mammogram 04/2019.  I discussed the assessment and treatment plan with the patient. The patient was provided an opportunity to ask questions and all were answered. The patient agreed with the plan and demonstrated an understanding of the instructions.   The patient was advised to call back or seek an in-person evaluation if the symptoms worsen or if the condition fails to improve as anticipated.  I provided 30 minutes of non-face-to-face time during this encounter.   Micheline Rough, MD

## 2018-12-20 ENCOUNTER — Other Ambulatory Visit: Payer: Self-pay | Admitting: Internal Medicine

## 2019-01-17 ENCOUNTER — Encounter: Payer: Self-pay | Admitting: Family Medicine

## 2019-01-17 ENCOUNTER — Other Ambulatory Visit (INDEPENDENT_AMBULATORY_CARE_PROVIDER_SITE_OTHER): Payer: BLUE CROSS/BLUE SHIELD

## 2019-01-17 ENCOUNTER — Other Ambulatory Visit: Payer: Self-pay

## 2019-01-17 ENCOUNTER — Ambulatory Visit (INDEPENDENT_AMBULATORY_CARE_PROVIDER_SITE_OTHER): Payer: BLUE CROSS/BLUE SHIELD | Admitting: Family Medicine

## 2019-01-17 DIAGNOSIS — L709 Acne, unspecified: Secondary | ICD-10-CM

## 2019-01-17 DIAGNOSIS — E785 Hyperlipidemia, unspecified: Secondary | ICD-10-CM

## 2019-01-17 DIAGNOSIS — L309 Dermatitis, unspecified: Secondary | ICD-10-CM | POA: Diagnosis not present

## 2019-01-17 DIAGNOSIS — I10 Essential (primary) hypertension: Secondary | ICD-10-CM

## 2019-01-17 DIAGNOSIS — E1165 Type 2 diabetes mellitus with hyperglycemia: Secondary | ICD-10-CM

## 2019-01-17 LAB — COMPREHENSIVE METABOLIC PANEL
ALT: 32 U/L (ref 0–35)
AST: 22 U/L (ref 0–37)
Albumin: 4.8 g/dL (ref 3.5–5.2)
Alkaline Phosphatase: 95 U/L (ref 39–117)
BUN: 15 mg/dL (ref 6–23)
CO2: 26 mEq/L (ref 19–32)
Calcium: 9.5 mg/dL (ref 8.4–10.5)
Chloride: 103 mEq/L (ref 96–112)
Creatinine, Ser: 0.74 mg/dL (ref 0.40–1.20)
GFR: 95.85 mL/min (ref >60.00)
Glucose, Bld: 141 mg/dL — ABNORMAL HIGH (ref 70–99)
Potassium: 4.2 mEq/L (ref 3.5–5.1)
Sodium: 140 mEq/L (ref 135–145)
Total Bilirubin: 0.6 mg/dL (ref 0.2–1.2)
Total Protein: 7.4 g/dL (ref 6.0–8.3)

## 2019-01-17 LAB — CBC WITH DIFFERENTIAL/PLATELET
Basophils Absolute: 0.1 10*3/uL (ref 0.0–0.1)
Basophils Relative: 1.4 % (ref 0.0–3.0)
Eosinophils Absolute: 0.2 10*3/uL (ref 0.0–0.7)
Eosinophils Relative: 2.2 % (ref 0.0–5.0)
HCT: 36 % (ref 36.0–46.0)
Hemoglobin: 11.6 g/dL — ABNORMAL LOW (ref 12.0–15.0)
Lymphocytes Relative: 32.1 % (ref 12.0–46.0)
Lymphs Abs: 2.2 10*3/uL (ref 0.7–4.0)
MCHC: 32.2 g/dL (ref 30.0–36.0)
MCV: 79.7 fl (ref 78.0–100.0)
Monocytes Absolute: 0.6 10*3/uL (ref 0.1–1.0)
Monocytes Relative: 8.2 % (ref 3.0–12.0)
Neutro Abs: 3.9 10*3/uL (ref 1.4–7.7)
Neutrophils Relative %: 56.1 % (ref 43.0–77.0)
Platelets: 305 10*3/uL (ref 150.0–400.0)
RBC: 4.52 Mil/uL (ref 3.87–5.11)
RDW: 13.8 % (ref 11.5–15.5)
WBC: 6.9 10*3/uL (ref 4.0–10.5)

## 2019-01-17 LAB — LIPID PANEL
Cholesterol: 133 mg/dL (ref 0–200)
HDL: 48.7 mg/dL (ref >39.00)
LDL Cholesterol: 61 mg/dL (ref 0–99)
NonHDL: 84.79
Total CHOL/HDL Ratio: 3
Triglycerides: 120 mg/dL (ref 0.0–149.0)
VLDL: 24 mg/dL (ref 0.0–40.0)

## 2019-01-17 LAB — MICROALBUMIN / CREATININE URINE RATIO
Creatinine,U: 129.4 mg/dL
Microalb Creat Ratio: 4.1 mg/g (ref 0.0–30.0)
Microalb, Ur: 5.3 mg/dL — ABNORMAL HIGH (ref 0.0–1.9)

## 2019-01-17 LAB — HEMOGLOBIN A1C: Hgb A1c MFr Bld: 8.6 % — ABNORMAL HIGH (ref 4.6–6.5)

## 2019-01-17 MED ORDER — TRIAMCINOLONE ACETONIDE 0.5 % EX OINT: 1.0000 "application " | TOPICAL_OINTMENT | Freq: Two times a day (BID) | CUTANEOUS | 0 refills | Status: DC

## 2019-01-17 NOTE — Progress Notes (Addendum)
Virtual Visit via Video Note  I connected with Hayley Fox  on 01/17/19 at  5:20 PM EDT by a video enabled telemedicine application and verified that I am speaking with the correct person using two identifiers.  Location patient: home Location provider:work or home office Persons participating in the virtual visit: patient, provider  I discussed the limitations of evaluation and management by telemedicine and the availability of in person appointments. The patient expressed understanding and agreed to proceed.   HPI:  Acute visit for rash on the hands: -started the last few weeks, but has had issues with dry skin on the hands in the past -hx of eczema -she uses a lot of hand sanitizer and has to wash hands frequently -she uses latex gloves -has dry cracked skin on both hands -denies puss or swelling -also has adult acne - small pustules on the face, chronic issue  ROS: See pertinent positives and negatives per HPI.  Past Medical History:  Diagnosis Date  . Actinic keratosis 03/15/2012  . Anemia   . Blood transfusion without reported diagnosis   . Childhood asthma    resolved  . Diabetes mellitus without complication ([place])   . Hyperlipidemia   . Hypertension   . 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 CESAREAN SECTION    . COLONOSCOPY  12/2016   negative screening    Family History  Problem Relation Age of Onset  . Diabetes Father   . Hypertension Father   . Hypertension Mother   . Alcohol abuse Mother   . Cirrhosis Mother   . Diabetes Brother   . High blood pressure Brother   . High Cholesterol Brother   . Colon cancer Neg Hx     SOCIAL HX: see hpi   Current Outpatient Medications:  .  amLODipine (NORVASC) 10 MG tablet, TAKE 1 TABLET BY MOUTH EVERY DAILY, Disp: 30 tablet, Rfl: 5 .  Continuous Blood Gluc Receiver (FREESTYLE LIBRE 14 DAY READER) DEVI, USE TO TEST  BLOOD SUGAR, Disp: 1 each, Rfl: 3 .  Continuous Blood Gluc Sensor (FREESTYLE LIBRE 14 DAY SENSOR) MISC, 1 Device by Does not apply route as directed., Disp: 3 each, Rfl: 6 .  glipiZIDE (GLUCOTROL) 5 MG tablet, Take 2 tablets (10 mg total) by mouth daily before breakfast AND 1 tablet (5 mg total) daily with supper., Disp: 90 tablet, Rfl: 3 .  losartan (COZAAR) 50 MG tablet, TAKE 1 TABLET BY MOUTH DAILY, Disp: 90 tablet, Rfl: 1 .  metFORMIN (GLUCOPHAGE XR) 500 MG 24 hr tablet, Take 1 tablet (500 mg total) by mouth 2 (two) times daily with a meal., Disp: 120 tablet, Rfl: 6 .  Multiple Vitamins-Minerals (ONE-A-DAY 50 PLUS PO), Take by mouth., Disp: , Rfl:  .  Probiotic Product (PRO-BIOTIC BLEND PO), Take 1 tablet by mouth daily. , Disp: , Rfl:  .  rosuvastatin (CRESTOR) 10 MG tablet, Take 1 tablet (10 mg total) by mouth daily., Disp: 90 tablet, Rfl: 1 .  sitaGLIPtin (JANUVIA) 100 MG tablet, Take 1 tablet (100 mg total) by mouth daily., Disp: 90 tablet, Rfl: 1 .  triamcinolone ointment (KENALOG) 0.5 %, Apply 1 application topically 2 (two) times daily., Disp: 30 g, Rfl: 0  Current Facility-Administered Medications:  .  0.9 %  sodium chloride infusion, 500 mL, Intravenous, Continuous, Carlean Purl Ofilia Neas, MD  EXAMTonette Fox per patient if applicable:  GENERAL:  alert, oriented, appears well and in no acute distress  HEENT: atraumatic, conjunttiva clear, no obvious abnormalities on inspection of external nose and ears  NECK: normal movements of the head and neck  LUNGS: on inspection no signs of respiratory distress, breathing rate appears normal, no obvious gross SOB, gasping or wheezing  CV: no obvious cyanosis  SKIN: poor video quality, not able to evaluate well  MS: moves all visible extremities without noticeable abnormality  PSYCH/NEURO: pleasant and cooperative, no obvious depression or anxiety, speech and thought processing grossly intact  ASSESSMENT AND PLAN:  Discussed the following  assessment and plan:  Hand eczema -we discussed possible serious and likely etiologies, workup and treatment, treatment risks and return precautions. Suspect hand eczema.  -after this discussion, Hayley Fox opted for good skin care, Aquaphor, short course topical steroid oint after discussion risks. -follow up advised as needed if symptoms worsen or persist or new concerns arise.   Adult acne -trial benzoyl peroxide 3-5% wash once daily -at tretinoin if needed -follow up if needed    I discussed the assessment and treatment plan with the patient. The patient was provided an opportunity to ask questions and all were answered. The patient agreed with the plan and demonstrated an understanding of the instructions.   Lucretia Kern, DO

## 2019-01-17 NOTE — Addendum Note (Signed)
Addended by: Lucretia Kern on: 01/17/2019 05:42 PM   Modules accepted: Orders, Level of Service

## 2019-01-17 NOTE — Patient Instructions (Signed)
Aquaphor several times daily on the hands to protect your skin  Try to avoid harsh antibacterial soaps  Wear non-latex gloves  Triamcinilone ointment 1-2 times daily for 1-2 weeks. Please do not use more frequently - can lighten the skin.  I hope you are feeling better soon! Seek care promptly if your symptoms worsen, new concerns arise or you are not improving with treatment.

## 2019-01-24 ENCOUNTER — Ambulatory Visit (INDEPENDENT_AMBULATORY_CARE_PROVIDER_SITE_OTHER): Payer: BLUE CROSS/BLUE SHIELD | Admitting: Family Medicine

## 2019-01-24 ENCOUNTER — Telehealth: Payer: Self-pay | Admitting: *Deleted

## 2019-01-24 ENCOUNTER — Other Ambulatory Visit: Payer: Self-pay

## 2019-01-24 DIAGNOSIS — E119 Type 2 diabetes mellitus without complications: Secondary | ICD-10-CM | POA: Diagnosis not present

## 2019-01-24 DIAGNOSIS — E785 Hyperlipidemia, unspecified: Secondary | ICD-10-CM

## 2019-01-24 DIAGNOSIS — I10 Essential (primary) hypertension: Secondary | ICD-10-CM | POA: Diagnosis not present

## 2019-01-24 NOTE — Telephone Encounter (Signed)
-----   Message from Lucretia Kern, DO sent at 01/24/2019 12:58 PM EDT ----- -lab visit in 90 days-follow up the following week-ask her for BP results when you call her to assist with these appts. Thanks!

## 2019-01-24 NOTE — Telephone Encounter (Signed)
I left a detailed message at the pts cell number to schedule appts as below and to provide BP reading.

## 2019-01-24 NOTE — Patient Instructions (Signed)
-  please check your blood pressure and call or MyChart message [place] with the results  -call your diabetes specialist to further assist with getting your diabetes under controll  -eat a healthy low sugar diet  -labs in 90 days with follow up after

## 2019-01-24 NOTE — Addendum Note (Signed)
Addended by: Lucretia Kern on: 01/24/2019 12:59 PM   Modules accepted: Orders

## 2019-01-24 NOTE — Progress Notes (Signed)
Virtual Visit via Telephone Note  I connected with Hayley Fox on 01/24/19 at 10:00 AM EDT by telephone and verified that I am speaking with the correct person using two identifiers.   I discussed the limitations, risks, security and privacy concerns of performing an evaluation and management service by telephone and the availability of in person appointments. I also discussed with the patient that there may be a patient responsible charge related to this service. The patient expressed understanding and agreed to proceed.  Location patient: home Location provider: work or home office Participants present for the call: patient, provider Patient did not have a visit in the prior 7 days to address this/these issue(s).   History of Present Illness:    Hayley Fox is a pleasant 63 y.o. who prefers a telephone visit for follow up. Chronic medical problems summarized below were reviewed for changes. Denies CP, SOB, DOE, treatment intolerance or new symptoms. She had labs recently. Reviewed.  Diabetes with HTN, HLD: -medications include:  -denies low blood sugars, CP, SOB, vision changes, fatigue -trying to eat less sugar and exercise -she has a blood pressure cuff and agrees to check - reports needs to get it out of box and did not check at the time of this appt  Anemia: -stable on labs  Observations/Objective: Patient sounds cheerful and well on the phone. I do not appreciate any SOB. Speech and thought processing are grossly intact. Patient reported vitals:  Assessment and Plan:  1. Type 2 diabetes mellitus without complication, without long-term current use of insulin ([facility]CC) -lifestyle recs -discussed healthier choices for snacks -advised she call endo for follow up and to further assistance with medication for her diabetes -discussed hgba1c/blood sugar goals and congratulated her on positive change since last check -discussed risks, potential complications and precuations -advised  added precaution during the [place] pandemic with masking, social distancing etc  2. Hyperlipidemia, unspecified hyperlipidemia type -cont current treatment  3. Essential hypertension -she agrees to get her cuff and check BP to call us with the results -cont current medication in interim  Follow Up Instructions:  @   [id number] [id number] [id number] [id number] [id number] 21-30 I did not refer this patient for an OV in the next 24 hours for this/these issue(s).  I discussed the assessment and treatment plan with the patient. The patient was provided an opportunity to ask questions and all were answered. The patient agreed with the plan and demonstrated an understanding of the instructions.   The patient was advised to call back or seek an in-person evaluation if the symptoms worsen or if the condition fails to improve as anticipated.    Follow up instructions: Advised assistant Wendie Simmer to help patient arrange the following: -lab visit in 90 days -follow up the following week -ask her for BP results when you call her to assist with these appts.  Thanks!  I provided 12 minutes of non-face-to-face time during this encounter.   Lucretia Kern, DO   Patient Instructions  -please check your blood pressure and call or MyChart message [place] with the results  -call your diabetes specialist to further assist with getting your diabetes under controll  -eat a healthy low sugar diet  -labs in 90 days with follow up after

## 2019-02-04 ENCOUNTER — Other Ambulatory Visit: Payer: Self-pay

## 2019-02-06 ENCOUNTER — Other Ambulatory Visit: Payer: Self-pay

## 2019-02-06 ENCOUNTER — Ambulatory Visit: Payer: BLUE CROSS/BLUE SHIELD | Admitting: Internal Medicine

## 2019-02-06 ENCOUNTER — Encounter: Payer: Self-pay | Admitting: Internal Medicine

## 2019-02-06 VITALS — BP 114/64 | HR 96 | Temp 98.9°F | Ht 64.0 in | Wt 183.6 lb

## 2019-02-06 DIAGNOSIS — E1165 Type 2 diabetes mellitus with hyperglycemia: Secondary | ICD-10-CM | POA: Diagnosis not present

## 2019-02-06 LAB — GLUCOSE, POCT (MANUAL RESULT ENTRY): POC Glucose: 224 mg/dl — AB (ref 70–99)

## 2019-02-06 NOTE — Patient Instructions (Addendum)
-   Glipizide 5 mg tablets, Take TWO tablets with Breakfast and ONE tablet with Supper - Continue Metformin 500 mg ONE tablet with Breakfast      HOW TO TREAT LOW BLOOD SUGARS (Blood sugar LESS THAN 70 MG/DL)  Please follow the RULE OF 15 for the treatment of hypoglycemia treatment (when your (blood sugars are less than 70 mg/dL)    STEP 1: Take 15 grams of carbohydrates when your blood sugar is low, which includes:   3-4 GLUCOSE TABS  OR  3-4 OZ OF JUICE OR REGULAR SODA OR  ONE TUBE OF GLUCOSE GEL     STEP 2: RECHECK blood sugar in 15 MINUTES STEP 3: If your blood sugar is still low at the 15 minute recheck --> then, go back to STEP 1 and treat AGAIN with another 15 grams of carbohydrates.

## 2019-02-06 NOTE — Progress Notes (Signed)
Name: Hayley Fox  Age/ Sex: 63 y.o., female   MRN/ DOB: [id number], 15-Aug-1955     PCP: Lucretia Kern, DO   Reason for Endocrinology Evaluation: Type 2 Diabetes Mellitus  Initial Endocrine Consultative Visit: 11/06/2018    PATIENT IDENTIFIER: Ms. Hayley Fox is a 63 y.o. female with a past medical history of HTN, Dyslipidemia and T2DM . The patient has followed with Endocrinology clinic since 11/06/2018 for consultative assistance with management of her diabetes.  DIABETIC HISTORY:  Hayley Fox was diagnosed with T2DM in 2016, she has been on oral glycemic agents since her diagnosis.No prior use of insulin. Her hemoglobin A1c has ranged from  6.9% in 2017, peaking at 9.8% in 2020.  On her initial visit to our clinic her A1c 9.8%. She was on metformin, glipizide and Januvia    She is a CNA SUBJECTIVE:   During the last visit (11/06/18): Increased Glipizide for breakfast, continued Januvia and metformin   Today (02/06/2019): Hayley Fox is here for a 3 month follow up visit on diabetes management. She checks her blood sugars 0 times daily.She had the [place] which she stopped using because it kept falling off and has not checking with a meter. The patient has not had hypoglycemic episodes since the last clinic visit.     ROS: As per HPI and as detailed below: Review of Systems  Respiratory: Negative for cough and shortness of breath.   Cardiovascular: Negative for chest pain and palpitations.  Gastrointestinal: Negative for diarrhea and nausea.  Musculoskeletal: Positive for neck pain.  Neurological: Negative for tingling and tremors.      HOME DIABETES REGIMEN:  Glipizide 10 mg with Breakfast -taking 1  Glipizide 5 mg with supper  Metformin 500 mg XR BID - taking 1 tablet daily  Januvia 100 mg daily - stopped    METER DOWNLOAD SUMMARY: Does not check      HISTORY:  Past Medical History:  Past Medical History:  Diagnosis Date  . Actinic keratosis 03/15/2012  . Anemia   .  Blood transfusion without reported diagnosis   . Childhood asthma    resolved  . Diabetes mellitus without complication ([place])   . Hyperlipidemia   . Hypertension   . 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 CESAREAN SECTION    . COLONOSCOPY  12/2016   negative screening    Social History:  reports that she has quit smoking. Her smoking use included cigarettes. She quit after 1.00 year of use. She has never used smokeless tobacco. She reports current alcohol use. She reports that she does not use drugs. Family History:  Family History  Problem Relation Age of Onset  . Diabetes Father   . Hypertension Father   . Hypertension Mother   . Alcohol abuse Mother   . Cirrhosis Mother   . Diabetes Brother   . High blood pressure Brother   . High Cholesterol Brother   . Colon cancer Neg Hx      HOME MEDICATIONS: Allergies as of 02/06/2019   No Known Allergies     Medication List       Accurate as of February 06, 2019  2:38 PM. If you have any questions, ask your nurse or doctor.        STOP taking these medications   sitaGLIPtin 100 MG tablet Commonly known as: Januvia Stopped  by: Dorita Sciara, MD     TAKE these medications   amLODipine 10 MG tablet Commonly known as: NORVASC TAKE 1 TABLET BY MOUTH EVERY DAILY   FreeStyle Libre 14 Day Reader Kerrin Mo USE TO TEST BLOOD SUGAR   FreeStyle Libre 14 Day Sensor Misc 1 Device by Does not apply route as directed.   glipiZIDE 5 MG tablet Commonly known as: GLUCOTROL Take 2 tablets (10 mg total) by mouth daily before breakfast AND 1 tablet (5 mg total) daily with supper.   losartan 50 MG tablet Commonly known as: COZAAR TAKE 1 TABLET BY MOUTH DAILY   metFORMIN 500 MG 24 hr tablet Commonly known as: Glucophage XR Take 1 tablet (500 mg total) by mouth 2 (two) times daily with a meal. What changed:  when to take this   ONE-A-DAY 50 PLUS PO Take by mouth.   PRO-BIOTIC BLEND PO Take 1 tablet by mouth daily.   rosuvastatin 10 MG tablet Commonly known as: Crestor Take 1 tablet (10 mg total) by mouth daily.   triamcinolone ointment 0.5 % Commonly known as: KENALOG Apply 1 application topically 2 (two) times daily.        OBJECTIVE:   Vital Signs: BP 114/64 (BP Location: Left Arm, Patient Position: Sitting, Cuff Size: Large)   Pulse 96   Temp 98.9 F (37.2 C)   Ht 5\' 4"  (1.626 m)   Wt 183 lb 9.6 oz (83.3 kg)   LMP 07/29/2011   SpO2 96%   BMI 31.51 kg/m   Wt Readings from Last 3 Encounters:  02/06/19 183 lb 9.6 oz (83.3 kg)  04/20/18 178 lb 4.8 oz (80.9 kg)  04/17/18 179 lb (81.2 kg)     Exam: General: Pt appears well and is in NAD  Hydration: Well-hydrated with moist mucous membranes and good skin turgor  HEENT: Head: Unremarkable with good dentition. Oropharynx clear without exudate.  Eyes: External eye exam normal without stare, lid lag or exophthalmos.  EOM intact.    Neck: General: Supple without adenopathy. Thyroid: Thyroid size normal.  No goiter or nodules appreciated. No thyroid bruit.  Lungs: Clear with good BS bilat with no rales, rhonchi, or wheezes  Heart: RRR with normal S1 and S2 and no gallops; no murmurs; no rub  Abdomen: Normoactive bowel sounds, soft, nontender, without masses or organomegaly palpable  Extremities: No pretibial edema. No tremor. Normal strength and motion throughout. See detailed diabetic foot exam below.  Skin: Normal texture and temperature to palpation. No rash noted. No Acanthosis nigricans/skin tags. No lipohypertrophy.  Neuro: MS is good with appropriate affect, pt is alert and Ox3    DM foot exam: 02/06/19  The skin of the feet is intact without sores or ulcerations. The pedal pulses are 2+ on right and 2+ on left. The sensation is intact to a screening 5.07, 10 gram monofilament bilaterally            DATA  REVIEWED:  Lab Results  Component Value Date   HGBA1C 8.6 (H) 01/17/2019   HGBA1C 9.8 (H) 10/18/2018   HGBA1C 9.5 (H) 04/10/2018   Lab Results  Component Value Date   MICROALBUR 5.3 (H) 01/17/2019   LDLCALC 61 01/17/2019   CREATININE 0.74 01/17/2019   Lab Results  Component Value Date   MICRALBCREAT 4.1 01/17/2019     Lab Results  Component Value Date   CHOL 133 01/17/2019   HDL 48.70 01/17/2019   LDLCALC 61 01/17/2019   TRIG 120.0 01/17/2019  CHOLHDL 3 01/17/2019         ASSESSMENT / PLAN / RECOMMENDATIONS:   1) Type 2 Diabetes Mellitus, Poorly controlled, Without complications - Most recent A1c of 8.6 %. Goal A1c < 7.0 %.     - A1c down from 9.6 % to 8.6%  - She does not check her glucose , she doesn't like to stick herself.  - she stopped her Januvia due to cost.  - She is unable to tolerate the BID dosing of metformin, so she is taking it once day. - She was unsure of how many Glipizide tablets she is taking, tells me  She takes whatever  Is written on the bottle, and that she has 2 medications that look the same and believes she takes one in the morning and one in the afternoon.  - Pt is a CNA  - Discussed the importance of checking glucose at home and the importance of that data to me. She declined a meter today , stating she will get the generic from [facility]   MEDICATIONS: - Glipizide 5 mg tablets, Take TWO tablets with Breakfast and ONE tablet with Supper - Continue Metformin 500 mg ONE tablet with Breakfast    EDUCATION / INSTRUCTIONS:  BG monitoring instructions: Patient is instructed to check her blood sugars 2 times a day, before breakfast and supper.  Call [facility] if: BG persistently < 70 or > 300. . I reviewed the Rule of 15 for the treatment of hypoglycemia in detail with the patient. Literature supplied.    F/U in 3 months    Signed electronically by: Mack Guise, MD  [facility]  [facility] [address] [place], [place] [address] Phone: [phone] FAX: [phone]   CC: Lucretia Kern, DO [address] [place] [address] Phone: [phone]  Fax: [phone]  Return to Endocrinology clinic as below: Future Appointments  Date Time Provider [place]  05/06/2019  2:00 PM Shamleffer, Melanie Crazier, MD [id number] None

## 2019-03-27 ENCOUNTER — Other Ambulatory Visit: Payer: Self-pay

## 2019-03-27 DIAGNOSIS — Z20822 Contact with and (suspected) exposure to covid-19: Secondary | ICD-10-CM

## 2019-03-28 LAB — NOVEL CORONAVIRUS, NAA: SARS-CoV-2, NAA: NOT DETECTED

## 2019-04-04 ENCOUNTER — Other Ambulatory Visit: Payer: Self-pay | Admitting: Family Medicine

## 2019-05-05 ENCOUNTER — Other Ambulatory Visit: Payer: Self-pay | Admitting: Family Medicine

## 2019-05-06 ENCOUNTER — Ambulatory Visit: Payer: BLUE CROSS/BLUE SHIELD | Admitting: Internal Medicine

## 2019-05-06 NOTE — Progress Notes (Deleted)
Name: Hayley Fox  Age/ Sex: 63 y.o., female   MRN/ DOB: [id number], December 06, 1955     PCP: Lucretia Kern, DO   Reason for Endocrinology Evaluation: Type 2 Diabetes Mellitus  Initial Endocrine Consultative Visit: 11/06/2018    PATIENT IDENTIFIER: Ms. Hayley Fox is a 63 y.o. female with a past medical history of HTN, Dyslipidemia and T2DM . The patient has followed with Endocrinology clinic since 11/06/2018 for consultative assistance with management of her diabetes.  DIABETIC HISTORY:  Ms. Hayley Fox was diagnosed with T2DM in 2016, she has been on oral glycemic agents since her diagnosis.No prior use of insulin. Her hemoglobin A1c has ranged from  6.9% in 2017, peaking at 9.8% in 2020.  On her initial visit to our clinic her A1c 9.8%. She was on metformin, glipizide and Januvia    She is a CNA SUBJECTIVE:   During the last visit (11/06/18): Increased Glipizide for breakfast, continued Januvia and metformin   Today (05/06/2019): Ms. Hayley Fox is here for a 3 month follow up visit on diabetes management. She checks her blood sugars 0 times daily.She had the [place] which she stopped using because it kept falling off and has not checking with a meter. The patient has not had hypoglycemic episodes since the last clinic visit.     ROS: As per HPI and as detailed below: Review of Systems  Respiratory: Negative for cough and shortness of breath.   Cardiovascular: Negative for chest pain and palpitations.  Gastrointestinal: Negative for diarrhea and nausea.  Musculoskeletal: Positive for neck pain.  Neurological: Negative for tingling and tremors.      HOME DIABETES REGIMEN:  Glipizide 10 mg with Breakfast -taking 1  Glipizide 5 mg with supper  Metformin 500 mg XR BID - taking 1 tablet daily  Januvia 100 mg daily - stopped    METER DOWNLOAD SUMMARY: Does not check      HISTORY:  Past Medical History:  Past Medical History:  Diagnosis Date  . Actinic keratosis 03/15/2012  . Anemia   .  Blood transfusion without reported diagnosis   . Childhood asthma    resolved  . Diabetes mellitus without complication ([place])   . Hyperlipidemia   . Hypertension   . 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 CESAREAN SECTION    . COLONOSCOPY  12/2016   negative screening    Social History:  reports that she has quit smoking. Her smoking use included cigarettes. She quit after 1.00 year of use. She has never used smokeless tobacco. She reports current alcohol use. She reports that she does not use drugs. Family History:  Family History  Problem Relation Age of Onset  . Diabetes Father   . Hypertension Father   . Hypertension Mother   . Alcohol abuse Mother   . Cirrhosis Mother   . Diabetes Brother   . High blood pressure Brother   . High Cholesterol Brother   . Colon cancer Neg Hx      HOME MEDICATIONS: Allergies as of 05/06/2019   No Known Allergies     Medication List       Accurate as of May 06, 2019 12:52 PM. If you have any questions, ask your nurse or doctor.        amLODipine 10 MG tablet Commonly known as: NORVASC TAKE 1 TABLET BY MOUTH EVERY DAILY  FreeStyle Libre 14 Day Reader Kerrin Mo USE TO TEST BLOOD SUGAR   FreeStyle Libre 14 Day Sensor Misc 1 Device by Does not apply route as directed.   glipiZIDE 5 MG tablet Commonly known as: GLUCOTROL Take 2 tablets (10 mg total) by mouth daily before breakfast AND 1 tablet (5 mg total) daily with supper.   losartan 50 MG tablet Commonly known as: COZAAR TAKE 1 TABLET BY MOUTH DAILY   metFORMIN 500 MG 24 hr tablet Commonly known as: Glucophage XR Take 1 tablet (500 mg total) by mouth 2 (two) times daily with a meal. What changed: when to take this   ONE-A-DAY 50 PLUS PO Take by mouth.   PRO-BIOTIC BLEND PO Take 1 tablet by mouth daily.   rosuvastatin 10 MG tablet Commonly  known as: Crestor Take 1 tablet (10 mg total) by mouth daily.   triamcinolone ointment 0.5 % Commonly known as: KENALOG Apply 1 application topically 2 (two) times daily.        OBJECTIVE:   Vital Signs: LMP 07/29/2011   Wt Readings from Last 3 Encounters:  02/06/19 183 lb 9.6 oz (83.3 kg)  04/20/18 178 lb 4.8 oz (80.9 kg)  04/17/18 179 lb (81.2 kg)     Exam: General: Pt appears well and is in NAD  Hydration: Well-hydrated with moist mucous membranes and good skin turgor  HEENT: Head: Unremarkable with good dentition. Oropharynx clear without exudate.  Eyes: External eye exam normal without stare, lid lag or exophthalmos.  EOM intact.    Neck: General: Supple without adenopathy. Thyroid: Thyroid size normal.  No goiter or nodules appreciated. No thyroid bruit.  Lungs: Clear with good BS bilat with no rales, rhonchi, or wheezes  Heart: RRR with normal S1 and S2 and no gallops; no murmurs; no rub  Abdomen: Normoactive bowel sounds, soft, nontender, without masses or organomegaly palpable  Extremities: No pretibial edema. No tremor. Normal strength and motion throughout. See detailed diabetic foot exam below.  Skin: Normal texture and temperature to palpation. No rash noted. No Acanthosis nigricans/skin tags. No lipohypertrophy.  Neuro: MS is good with appropriate affect, pt is alert and Ox3    DM foot exam: 02/06/19  The skin of the feet is intact without sores or ulcerations. The pedal pulses are 2+ on right and 2+ on left. The sensation is intact to a screening 5.07, 10 gram monofilament bilaterally            DATA REVIEWED:  Lab Results  Component Value Date   HGBA1C 8.6 (H) 01/17/2019   HGBA1C 9.8 (H) 10/18/2018   HGBA1C 9.5 (H) 04/10/2018   Lab Results  Component Value Date   MICROALBUR 5.3 (H) 01/17/2019   LDLCALC 61 01/17/2019   CREATININE 0.74 01/17/2019   Lab Results  Component Value Date   MICRALBCREAT 4.1 01/17/2019     Lab Results   Component Value Date   CHOL 133 01/17/2019   HDL 48.70 01/17/2019   LDLCALC 61 01/17/2019   TRIG 120.0 01/17/2019   CHOLHDL 3 01/17/2019         ASSESSMENT / PLAN / RECOMMENDATIONS:   1) Type 2 Diabetes Mellitus, Poorly controlled, Without complications - Most recent A1c of 8.6 %. Goal A1c < 7.0 %.     - A1c down from 9.6 % to 8.6%  - She does not check her glucose , she doesn't like to stick herself.  - she stopped her Januvia due to cost.  - She is unable to  tolerate the BID dosing of metformin, so she is taking it once day. - She was unsure of how many Glipizide tablets she is taking, tells me  She takes whatever  Is written on the bottle, and that she has 2 medications that look the same and believes she takes one in the morning and one in the afternoon.  - Pt is a CNA  - Discussed the importance of checking glucose at home and the importance of that data to me. She declined a meter today , stating she will get the generic from [facility]   MEDICATIONS: - Glipizide 5 mg tablets, Take TWO tablets with Breakfast and ONE tablet with Supper - Continue Metformin 500 mg ONE tablet with Breakfast    EDUCATION / INSTRUCTIONS:  BG monitoring instructions: Patient is instructed to check her blood sugars 2 times a day, before breakfast and supper.  Call [facility] if: BG persistently < 70 or > 300. . I reviewed the Rule of 15 for the treatment of hypoglycemia in detail with the patient. Literature supplied.    F/U in 3 months    Signed electronically by: Mack Guise, MD  [facility] Endocrinology  [facility] [address] [place], [place] [address] Phone: [phone] FAX: [phone]   CC: Lucretia Kern, DO [address] [place] [address] Phone: [phone]  Fax: [phone]  Return to Endocrinology clinic as below: Future Appointments  Date Time Provider [place]  05/06/2019  2:00 PM  Trude Cansler, Melanie Crazier, MD [facility] None

## 2019-05-21 ENCOUNTER — Other Ambulatory Visit: Payer: Self-pay | Admitting: Family Medicine

## 2019-05-21 NOTE — Telephone Encounter (Signed)
See request °

## 2019-05-21 NOTE — Telephone Encounter (Signed)
Requested medication (s) are due for refill today: yes  Requested medication (s) are on the active medication list: yes  Last refill:  11/06/2018  Future visit scheduled: no  Notes to clinic: review for refill Last filled by a different provider    Requested Prescriptions  Pending Prescriptions Disp Refills   metFORMIN (GLUCOPHAGE XR) 500 MG 24 hr tablet 120 tablet 6    Sig: Take 1 tablet (500 mg total) by mouth 2 (two) times daily with a meal.     Endocrinology:  Diabetes - Biguanides Failed - 05/21/2019  2:11 PM      Failed - HBA1C is between 0 and 7.9 and within 180 days    Hgb A1c MFr Bld  Date Value Ref Range Status  01/17/2019 8.6 (H) 4.6 - 6.5 % Final    Comment:    Glycemic Control Guidelines for People with Diabetes:Non Diabetic:  <6%Goal of Therapy: <7%Additional Action Suggested:  >8%          Passed - Cr in normal range and within 360 days    Creat  Date Value Ref Range Status  04/21/2017 1.39 (H) 0.50 - 0.99 mg/dL Final    Comment:    For patients >32 years of age, the reference limit for Creatinine is approximately 13% higher for people identified as African-American. .    Creatinine, Ser  Date Value Ref Range Status  01/17/2019 0.74 0.40 - 1.20 mg/dL Final         Passed - eGFR in normal range and within 360 days    GFR, Est African American  Date Value Ref Range Status  07/11/2016 81 >=60 mL/min Final   GFR, Est Non African American  Date Value Ref Range Status  07/11/2016 71 >=60 mL/min Final   GFR  Date Value Ref Range Status  01/17/2019 95.85 >60.00 mL/min Final         Passed - Valid encounter within last 6 months    Recent Outpatient Visits          3 months ago Type 2 diabetes mellitus without complication, without long-term current use of insulin ([place])   Therapist, music at [facility], [place] R, DO   4 months ago Hand eczema   Therapist, music at [facility], [place], DO   5 months ago Essential hypertension   Forensic psychologist at [employer], [place], MD   6 months ago Type 2 diabetes mellitus without complication, without long-term current use of insulin ([place])   Therapist, music at [place], DO   7 months ago Chronic right-sided low back pain with right-sided sciatica   Therapist, music at [facility], [place], DO

## 2019-05-21 NOTE — Telephone Encounter (Signed)
Medication Refill - Medication: metFORMIN (GLUCOPHAGE XR) 500 MG 24 hr tablet    Preferred Pharmacy (with phone number or street name):  [facility] [id number], [place] [phone] (Phone) [phone] (Fax)     Agent: Please be advised that RX refills may take up to 3 business days. We ask that you follow-up with your pharmacy.

## 2019-05-22 ENCOUNTER — Other Ambulatory Visit: Payer: Self-pay | Admitting: Family Medicine

## 2019-05-22 DIAGNOSIS — E119 Type 2 diabetes mellitus without complications: Secondary | ICD-10-CM

## 2019-05-22 MED ORDER — LOSARTAN POTASSIUM 50 MG PO TABS: 50.0000 mg | ORAL_TABLET | Freq: Every day | ORAL | 1 refills | Status: DC

## 2019-05-22 MED ORDER — ROSUVASTATIN CALCIUM 10 MG PO TABS: 10.0000 mg | ORAL_TABLET | Freq: Every day | ORAL | 1 refills | Status: DC

## 2019-05-22 MED ORDER — AMLODIPINE BESYLATE 10 MG PO TABS: ORAL_TABLET | ORAL | 2 refills | Status: DC

## 2019-05-22 NOTE — Telephone Encounter (Signed)
Copied from [place] [id number]. Topic: Quick Communication - Rx Refill/Question >> May 22, 2019 10:38 AM Leward Quan A wrote: Medication: rosuvastatin (CRESTOR) 10 MG tablet, glipiZIDE (GLUCOTROL) 5 MG tablet, amLODipine (NORVASC) 10 MG tablet, losartan (COZAAR) 50 MG tablet   Has the patient contacted their pharmacy? Yes.   (Agent: If no, request that the patient contact the pharmacy for the refill.) (Agent: If yes, when and what did the pharmacy advise?)  Preferred Pharmacy (with phone number or street name): [facility], [place] [address] [place] [phone] (Phone) [phone] (Fax)    Agent: Please be advised that RX refills may take up to 3 business days. We ask that you follow-up with your pharmacy.

## 2019-05-22 NOTE — Telephone Encounter (Signed)
See request °

## 2019-05-22 NOTE — Telephone Encounter (Signed)
Requested medication (s) are due for refill today: yes  Requested medication (s) are on the active medication list: yes  Last refill:  11/06/2018  Future visit scheduled: no  Notes to clinic: review for refill Last filled by different provider    Requested Prescriptions  Pending Prescriptions Disp Refills   glipiZIDE (GLUCOTROL) 5 MG tablet 90 tablet 3    Sig: Take 2 tablets (10 mg total) by mouth daily before breakfast AND 1 tablet (5 mg total) daily with supper.     Endocrinology:  Diabetes - Sulfonylureas Failed - 05/22/2019 10:55 AM      Failed - HBA1C is between 0 and 7.9 and within 180 days    Hgb A1c MFr Bld  Date Value Ref Range Status  01/17/2019 8.6 (H) 4.6 - 6.5 % Final    Comment:    Glycemic Control Guidelines for People with Diabetes:Non Diabetic:  <6%Goal of Therapy: <7%Additional Action Suggested:  >8%          Passed - Valid encounter within last 6 months    Recent Outpatient Visits          3 months ago Type 2 diabetes mellitus without complication, without long-term current use of insulin ([place])   Therapist, music at [facility], [place], DO   4 months ago Hand eczema   Therapist, music at [facility], [place] [employer] [place], DO   5 months ago Essential hypertension   Therapist, music at [employer], [place], MD   6 months ago Type 2 diabetes mellitus without complication, without long-term current use of insulin ([place])   Therapist, music at [facility], [place] R, DO   7 months ago Chronic right-sided low back pain with right-sided sciatica   Therapist, music at [facility], [place], DO             Signed Prescriptions Disp Refills   rosuvastatin (CRESTOR) 10 MG tablet 90 tablet 1    Sig: Take 1 tablet (10 mg total) by mouth daily.     Cardiovascular:  Antilipid - Statins Passed - 05/22/2019 10:55 AM      Passed - Total Cholesterol in normal range and within 360 days    Cholesterol  Date Value Ref Range Status  01/17/2019 133 0  - 200 mg/dL Final    Comment:    ATP III Classification       Desirable:  < 200 mg/dL               Borderline High:  200 - 239 mg/dL          High:  > = 240 mg/dL         Passed - LDL in normal range and within 360 days    LDL Cholesterol  Date Value Ref Range Status  01/17/2019 61 0 - 99 mg/dL Final         Passed - HDL in normal range and within 360 days    HDL  Date Value Ref Range Status  01/17/2019 48.70 >39.00 mg/dL Final         Passed - Triglycerides in normal range and within 360 days    Triglycerides  Date Value Ref Range Status  01/17/2019 120.0 0.0 - 149.0 mg/dL Final    Comment:    Normal:  <150 mg/dLBorderline High:  150 - 199 mg/dL         Passed - Patient is not pregnant      Passed - Valid encounter within last  12 months    Recent Outpatient Visits          3 months ago Type 2 diabetes mellitus without complication, without long-term current use of insulin ([place])   Therapist, music at [facility], [place], DO   4 months ago Hand eczema   Therapist, music at [facility], [employer], DO   5 months ago Essential hypertension   Therapist, music at [employer], [place], MD   6 months ago Type 2 diabetes mellitus without complication, without long-term current use of insulin ([place])   Therapist, music at [facility], [place], DO   7 months ago Chronic right-sided low back pain with right-sided sciatica   Therapist, music at [employer], [place] R, DO              amLODipine (NORVASC) 10 MG tablet 30 tablet 2    Sig: TAKE 1 TABLET BY MOUTH EVERY DAILY     Cardiovascular:  Calcium Channel Blockers Passed - 05/22/2019 10:55 AM      Passed - Last BP in normal range    BP Readings from Last 1 Encounters:  02/06/19 114/64         Passed - Valid encounter within last 6 months    Recent Outpatient Visits          3 months ago Type 2 diabetes mellitus without complication, without long-term current use of insulin ([place])    Therapist, music at [facility], [place], DO   4 months ago [place] at [facility], [employer], DO   5 months ago Essential hypertension   Therapist, music at [employer], [place], MD   6 months ago Type 2 diabetes mellitus without complication, without long-term current use of insulin ([place])   Therapist, music at [facility], [place] R, DO   7 months ago Chronic right-sided low back pain with right-sided sciatica   Therapist, music at [facility], [place] R, DO              losartan (COZAAR) 50 MG tablet 90 tablet 1    Sig: Take 1 tablet (50 mg total) by mouth daily.     Cardiovascular:  Angiotensin Receptor Blockers Passed - 05/22/2019 10:55 AM      Passed - Cr in normal range and within 180 days    Creat  Date Value Ref Range Status  04/21/2017 1.39 (H) 0.50 - 0.99 mg/dL Final    Comment:    For patients >79 years of age, the reference limit for Creatinine is approximately 13% higher for people identified as African-American. .    Creatinine, Ser  Date Value Ref Range Status  01/17/2019 0.74 0.40 - 1.20 mg/dL Final         Passed - K in normal range and within 180 days    Potassium  Date Value Ref Range Status  01/17/2019 4.2 3.5 - 5.1 mEq/L Final         Passed - Patient is not pregnant      Passed - Last BP in normal range    BP Readings from Last 1 Encounters:  02/06/19 114/64         Passed - Valid encounter within last 6 months    Recent Outpatient Visits          3 months ago Type 2 diabetes mellitus without complication, without long-term current use of insulin ([place])   Therapist, music at [facility], [employer],  DO   4 months ago Hand eczema   Therapist, music at [facility], [place], DO   5 months ago Essential hypertension   Therapist, music at [employer], [place], MD   6 months ago Type 2 diabetes mellitus without complication, without long-term current use of insulin ([place])    Therapist, music at [place], DO   7 months ago Chronic right-sided low back pain with right-sided sciatica   Therapist, music at [facility], [place], DO

## 2019-05-23 ENCOUNTER — Other Ambulatory Visit: Payer: Self-pay | Admitting: Internal Medicine

## 2019-05-23 MED ORDER — METFORMIN HCL ER 500 MG PO TB24: 500.0000 mg | ORAL_TABLET | Freq: Two times a day (BID) | ORAL | 0 refills | Status: DC

## 2019-05-23 MED ORDER — GLIPIZIDE 5 MG PO TABS: ORAL_TABLET | ORAL | 3 refills | Status: DC

## 2019-05-23 NOTE — Telephone Encounter (Signed)
Pt seen by Endo for DM management.

## 2019-06-03 ENCOUNTER — Other Ambulatory Visit: Payer: Self-pay

## 2019-06-03 DIAGNOSIS — Z20822 Contact with and (suspected) exposure to covid-19: Secondary | ICD-10-CM

## 2019-06-05 LAB — NOVEL CORONAVIRUS, NAA: SARS-CoV-2, NAA: NOT DETECTED

## 2019-06-20 ENCOUNTER — Other Ambulatory Visit: Payer: Self-pay | Admitting: Family Medicine

## 2019-07-08 ENCOUNTER — Other Ambulatory Visit: Payer: Self-pay | Admitting: Internal Medicine

## 2019-07-08 DIAGNOSIS — E119 Type 2 diabetes mellitus without complications: Secondary | ICD-10-CM

## 2019-07-18 ENCOUNTER — Telehealth (INDEPENDENT_AMBULATORY_CARE_PROVIDER_SITE_OTHER): Payer: 59 | Admitting: Family Medicine

## 2019-07-18 ENCOUNTER — Other Ambulatory Visit: Payer: Self-pay

## 2019-07-18 DIAGNOSIS — E119 Type 2 diabetes mellitus without complications: Secondary | ICD-10-CM

## 2019-07-18 DIAGNOSIS — I10 Essential (primary) hypertension: Secondary | ICD-10-CM | POA: Diagnosis not present

## 2019-07-18 DIAGNOSIS — E785 Hyperlipidemia, unspecified: Secondary | ICD-10-CM | POA: Diagnosis not present

## 2019-07-18 NOTE — Progress Notes (Signed)
Virtual Visit via Video Note  I connected with Hayley Fox  on 07/18/19 at  1:20 PM EST by a video enabled telemedicine application and verified that I am speaking with the correct person using two identifiers.  Location patient: home Location provider:work or home office Persons participating in the virtual visit: patient, provider  I discussed the limitations of evaluation and management by telemedicine and the availability of in person appointments. The patient expressed understanding and agreed to proceed.   HPI:   Seen at her request for refills. PMH of DM, HTN, HLD and mild chronic anemia. She saw her endocrinologist over the summer. Had labs in the summer with improvement in Hgba1c, but still above goal. Lipids and other labs ok at that time. She did see Dr. Ethlyn Gallery - so, has PCP now. She was not compliant with recommendations to follow up in 90 days after the last labs. Reports she isn't sure what medications she is taking. Got her bottles out. Reviewed with her. Says is taking 500mg  of metformin daily - reports if takes more she feels nauseated. Amlodipine 10mg  once daily. Losartan 50mg  once daily. Crestor 10mg  daily. Glipizide 5mg  twice daily - reports she has been out of this for 3 days. Reports is not taking Januvia because it was expensive, but she is going to try it again as she has another insurance. She reports her diet and exercise have not been good since the [place] pandemic.Reports snacking more.   HM due includes: pneumococcal vaccine, mammogram, pap, foot exam and eye exam per Epic. Reports she did her Diabetic eye with Dr. [place] last week. Not updated/abstracted yet. She wants to set up a physical to do her pap smear with Dr. Ethlyn Gallery.   She has some neck pain - new issue: -mild pain, but annoying, started about 4 months ago, she thought was from the way she was sleeping -R upper back and neck (points to area of upper fibers of traps) -she rubs topical pain cream on it which  does not help -denies weakness, numbness, radiation to extremities -hurts when bends neck towards the L  ROS: See pertinent positives and negatives per HPI.  Past Medical History:  Diagnosis Date  . Actinic keratosis 03/15/2012  . Anemia   . Blood transfusion without reported diagnosis   . Childhood asthma    resolved  . Diabetes mellitus without complication ([place])   . Hyperlipidemia   . Hypertension   . 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 CESAREAN SECTION    . COLONOSCOPY  12/2016   negative screening    Family History  Problem Relation Age of Onset  . Diabetes Father   . Hypertension Father   . Hypertension Mother   . Alcohol abuse Mother   . Cirrhosis Mother   . Diabetes Brother   . High blood pressure Brother   . High Cholesterol Brother   . Colon cancer Neg Hx     SOCIAL HX: see hpi   Current Outpatient Medications:  .  amLODipine (NORVASC) 10 MG tablet, TAKE 1 TABLET BY MOUTH EVERY DAILY, Disp: 30 tablet, Rfl: 2 .  Continuous Blood Gluc Receiver (FREESTYLE LIBRE 14 DAY READER) DEVI, USE TO TEST BLOOD SUGAR (Patient not taking: Reported on 02/06/2019), Disp: 1 each, Rfl: 3 .  Continuous Blood Gluc Sensor (FREESTYLE LIBRE 14 DAY SENSOR) MISC, 1 Device by Does not apply route  as directed. (Patient not taking: Reported on 02/06/2019), Disp: 3 each, Rfl: 6 .  glipiZIDE (GLUCOTROL) 5 MG tablet, Take 2 tablets (10 mg total) by mouth daily before breakfast AND 1 tablet (5 mg total) daily with supper., Disp: 90 tablet, Rfl: 3 .  JANUVIA 100 MG tablet, TAKE 1 TABLET(100 MG) BY MOUTH DAILY, Disp: 90 tablet, Rfl: 0 .  losartan (COZAAR) 50 MG tablet, Take 1 tablet (50 mg total) by mouth daily., Disp: 90 tablet, Rfl: 1 .  metFORMIN (GLUCOPHAGE XR) 500 MG 24 hr tablet, Take 1 tablet (500 mg total) by mouth 2 (two) times daily with a meal., Disp: 120 tablet, Rfl: 0 .  Multiple  Vitamins-Minerals (ONE-A-DAY 50 PLUS PO), Take by mouth., Disp: , Rfl:  .  Probiotic Product (PRO-BIOTIC BLEND PO), Take 1 tablet by mouth daily. , Disp: , Rfl:  .  rosuvastatin (CRESTOR) 10 MG tablet, Take 1 tablet (10 mg total) by mouth daily., Disp: 90 tablet, Rfl: 1 .  triamcinolone ointment (KENALOG) 0.5 %, Apply 1 application topically 2 (two) times daily., Disp: 30 g, Rfl: 0  Current Facility-Administered Medications:  .  0.9 %  sodium chloride infusion, 500 mL, Intravenous, Continuous, Carlean Purl, Ofilia Neas, MD  EXAM:  VITALS per patient if applicable:  GENERAL: alert, oriented, appears well and in no acute distress  HEENT: atraumatic, conjunttiva clear, no obvious abnormalities on inspection of external nose and ears  NECK: normal movements of the head and neck  LUNGS: on inspection no signs of respiratory distress, breathing rate appears normal, no obvious gross SOB, gasping or wheezing  CV: no obvious cyanosis  MS: moves all visible extremities without noticeable abnormality  PSYCH/NEURO: pleasant and cooperative, no obvious depression or anxiety, speech and thought processing grossly intact  ASSESSMENT AND PLAN:  Discussed the following assessment and plan:  Type 2 diabetes mellitus without complication, without long-term current use of insulin (HCC)  Essential hypertension  Hyperlipidemia, unspecified hyperlipidemia type  -we discussed possible serious and likely etiologies, options for evaluation and workup, limitations of telemedicine visit vs in person visit, treatment, treatment risks and precautions. Pt prefers to treat via telemedicine empirically rather then risking or undertaking an in person visit at this moment. Patient agrees to seek prompt in person care if worsening, new symptoms arise, or if is not improving with treatment.   I discussed the assessment and treatment plan with the patient. The patient was provided an opportunity to ask questions and all were  answered. The patient agreed with the plan and demonstrated an understanding of the instructions.   The patient was advised to call back or seek an in-person evaluation if the symptoms worsen or if the condition fails to improve as anticipated.   Lucretia Kern, DO

## 2019-07-18 NOTE — Progress Notes (Signed)
NOTE: prior note locked out. Not sure why - before I had the chance to finish and update my template. This is the finished note.     Virtual Visit via Video Note  I connected with Hayley Fox  on 07/18/19 at 1:20 PM EST by a video enabled telemedicine application and verified that I am speaking with the correct person using two identifiers.  Location patient: home  Location provider:work or home office  Persons participating in the virtual visit: patient, provider  I discussed the limitations of evaluation and management by telemedicine and the availability of in person appointments. The patient expressed understanding and agreed to proceed.  HPI:  Seen at her request for refills. PMH of DM, HTN, HLD and mild chronic anemia. She saw her endocrinologist over the summer. Had labs in the summer with improvement in Hgba1c, but still above goal. Lipids and other labs ok at that time. She did see Dr. Ethlyn Gallery - so, has PCP now. She was not compliant with recommendations to follow up in 90 days after the last labs.  Reports she isn't sure what medications she is taking. Got her bottles out. Reviewed with her. Says is taking 500mg  of metformin daily - reports if takes more she feels nauseated. Amlodipine 10mg  once daily. Losartan 50mg  once daily. Crestor 10mg  daily. Glipizide 5mg  twice daily - reports she has been out of this for 3 days. Reports is not taking Januvia because it was expensive, but she is going to try it again as she has another insurance.  She reports her diet and exercise have not been good since the [place] pandemic.Reports snacking more.  HM due includes: pneumococcal vaccine, mammogram, pap, foot exam and eye exam per Epic. Reports she did her Diabetic eye with Dr. [place] last week. Not updated/abstracted yet. She wants to set up a physical to do her pap smear with Dr. Ethlyn Gallery.  She has some neck pain - new issue:  -mild pain, but annoying, started about 4 months ago, she thought was from the  way she was sleeping  -R upper back and neck (points to area of upper fibers of traps)  -she rubs topical pain cream on it which does not help  -denies weakness, numbness, radiation to extremities  -hurts when bends neck towards the L  ROS: See pertinent positives and negatives per HPI.      Past Medical History:  Diagnosis Date  . Actinic keratosis 03/15/2012  . Anemia   . Blood transfusion without reported diagnosis   . Childhood asthma    resolved  . Diabetes mellitus without complication ([place])   . Hyperlipidemia   . Hypertension   . Osteoarthrosis, hand 06/06/2008   Qualifier: Diagnosis of By: Sherren Mocha MD, Jory Ee         Past Surgical History:  Procedure Laterality Date  . CESAREAN SECTION    . COLONOSCOPY  12/2016   negative screening        Family History  Problem Relation Age of Onset  . Diabetes Father   . Hypertension Father   . Hypertension Mother   . Alcohol abuse Mother   . Cirrhosis Mother   . Diabetes Brother   . High blood pressure Brother   . High Cholesterol Brother   . Colon cancer Neg Hx    SOCIAL HX: see hpi   Current Outpatient Medications:  . amLODipine (NORVASC) 10 MG tablet, TAKE 1 TABLET BY MOUTH EVERY DAILY, Disp: 30 tablet, Rfl: 2  . Continuous Blood Gluc  Receiver (FREESTYLE LIBRE 14 DAY READER) DEVI, USE TO TEST BLOOD SUGAR (Patient not taking: Reported on 02/06/2019), Disp: 1 each, Rfl: 3  . Continuous Blood Gluc Sensor (FREESTYLE LIBRE 14 DAY SENSOR) MISC, 1 Device by Does not apply route as directed. (Patient not taking: Reported on 02/06/2019), Disp: 3 each, Rfl: 6  . glipiZIDE (GLUCOTROL) 5 MG tablet, Take 2 tablets (10 mg total) by mouth daily before breakfast AND 1 tablet (5 mg total) daily with supper., Disp: 90 tablet, Rfl: 3  . JANUVIA 100 MG tablet, TAKE 1 TABLET(100 MG) BY MOUTH DAILY, Disp: 90 tablet, Rfl: 0  . losartan (COZAAR) 50 MG tablet, Take 1 tablet (50 mg total) by mouth daily., Disp: 90 tablet, Rfl: 1  . metFORMIN  (GLUCOPHAGE XR) 500 MG 24 hr tablet, Take 1 tablet (500 mg total) by mouth 2 (two) times daily with a meal., Disp: 120 tablet, Rfl: 0  . Multiple Vitamins-Minerals (ONE-A-DAY 50 PLUS PO), Take by mouth., Disp: , Rfl:  . Probiotic Product (PRO-BIOTIC BLEND PO), Take 1 tablet by mouth daily. , Disp: , Rfl:  . rosuvastatin (CRESTOR) 10 MG tablet, Take 1 tablet (10 mg total) by mouth daily., Disp: 90 tablet, Rfl: 1  . triamcinolone ointment (KENALOG) 0.5 %, Apply 1 application topically 2 (two) times daily., Disp: 30 g, Rfl: 0  Current Facility-Administered Medications:  . 0.9 % sodium chloride infusion, 500 mL, Intravenous, Continuous, Gatha Mayer, MD  EXAM:  VITALS per patient if applicable:  GENERAL: alert, oriented, appears well and in no acute distress  HEENT: atraumatic, conjunttiva clear, no obvious abnormalities on inspection of external nose and ears  NECK: normal movements of the head and neck, ? Decreased ROM in sidebending to the L, exam limited via video, points to area of upper fibers of traps as problem area. LUNGS: on inspection no signs of respiratory distress, breathing rate appears normal, no obvious gross SOB, gasping or wheezing  CV: no obvious cyanosis  MS: moves all visible extremities without noticeable abnormality  PSYCH/NEURO: pleasant and cooperative, no obvious depression or anxiety, speech and thought processing grossly intact  ASSESSMENT AND PLAN:  Discussed the following assessment and plan:  Type 2 diabetes mellitus without complication, without long-term current use of insulin (HCC)  Essential hypertension  Hyperlipidemia, unspecified hyperlipidemia type  Neck pain Other counseling Health maintenance counseling -we discussed possible serious and likely etiologies for the new concern of neck pain, options for evaluation and workup, limitations of telemedicine visit vs in person visit, treatment, treatment risks and precautions. Pt prefers to treat via  telemedicine empirically rather then risking or undertaking an in person visit at this moment. I discussed the assessment and treatment plan with the patient. However, given ongoing issues for 4 months have advised inperson evaluation and may need imaging pending exam and/or referral or PT. The patient was provided an opportunity to ask questions and all were answered. The patient agreed with the plan and demonstrated an understanding of the instructions.  For her diabetes, HTN and HLD reviewed a healthy low carb diet at length and advised 20-30 minutes of aerobic exercise 4-5 days per week. Reviewed all of her medications with her and discussed the importance and indications for each medication. She currently is not taking the glipizide or the Januvia and is only taking a low does of the Metformin. She is due for labs - ordered. Advised importance of compliance with her medications. Refills were already sent recently. Advised she contact our office right  away if has any difficulty getting her medication. Advised she needs a BP reading - she agrees to come in for CPE and eval of her neck with her PCP. Reviewed HM due and she is agreeable to getting pap and vaccine at her CPE. Reports she had her diabetic eye exam last week. Will send message to assistant to obtain report and abstract. Sent message to scheduling to set up CPE.  Follow up q 3-4 months for chronic disease. The patient was advised to call back or seek an in-person evaluation if the symptoms worsen or if the condition fails to improve as anticipated.  Lucretia Kern, DO

## 2019-07-23 ENCOUNTER — Telehealth: Payer: Self-pay | Admitting: Family Medicine

## 2019-07-23 NOTE — Telephone Encounter (Signed)
Pt would like to do her labs for her cpe and then follow up with the provider. I informed pt that I will keep the CPE we scheduled for 10/25/19 at 1:00pm and send a note back to the CMA to reach out to her if she is able to do so.  Pt can be reached at [phone]

## 2019-07-24 ENCOUNTER — Other Ambulatory Visit: Payer: Self-pay | Admitting: Family Medicine

## 2019-07-24 DIAGNOSIS — I10 Essential (primary) hypertension: Secondary | ICD-10-CM

## 2019-07-24 DIAGNOSIS — E785 Hyperlipidemia, unspecified: Secondary | ICD-10-CM

## 2019-07-24 DIAGNOSIS — E119 Type 2 diabetes mellitus without complications: Secondary | ICD-10-CM

## 2019-07-24 NOTE — Telephone Encounter (Signed)
I called the pt and scheduled a lab appt for 4/26 to arrive at 7:45am.

## 2019-07-24 NOTE — Telephone Encounter (Signed)
Labs have been ordered

## 2019-08-29 ENCOUNTER — Ambulatory Visit: Payer: 59 | Attending: Internal Medicine

## 2019-08-29 DIAGNOSIS — Z23 Encounter for immunization: Secondary | ICD-10-CM

## 2019-08-29 NOTE — Progress Notes (Signed)
   Covid-19 Vaccination Clinic  Name:  Hayley Fox    MRN: [id number] DOB: 1955-11-07  08/29/2019  Hayley Fox was observed post Covid-19 immunization for 15 minutes without incident. She was provided with Vaccine Information Sheet and instruction to access the V-Safe system.   Hayley Fox was instructed to call 911 with any severe reactions post vaccine: Marland Kitchen Difficulty breathing  . Swelling of face and throat  . A fast heartbeat  . A bad rash all over body  . Dizziness and weakness   Immunizations Administered    Name Date Dose VIS Date Route   Pfizer COVID-19 Vaccine 08/29/2019  2:19 PM 0.3 mL 05/31/2019 Intramuscular   Manufacturer: [place]   Lot: [id number]   [place]: [id number]

## 2019-09-11 ENCOUNTER — Telehealth: Payer: Self-pay | Admitting: *Deleted

## 2019-09-11 ENCOUNTER — Other Ambulatory Visit: Payer: Self-pay | Admitting: Family Medicine

## 2019-09-11 MED ORDER — LOSARTAN POTASSIUM 50 MG PO TABS: 50.0000 mg | ORAL_TABLET | Freq: Every day | ORAL | 1 refills | Status: DC

## 2019-09-11 MED ORDER — AMLODIPINE BESYLATE 10 MG PO TABS: ORAL_TABLET | ORAL | 1 refills | Status: DC

## 2019-09-11 NOTE — Telephone Encounter (Signed)
done

## 2019-09-11 NOTE — Telephone Encounter (Signed)
[facility] refill [facility] for Losartan 50mg  and Amlodipine 10mg .  Message sent to PCP as last Rxs were given by Dr Maudie Mercury.

## 2019-09-11 NOTE — Telephone Encounter (Signed)
Noted  

## 2019-09-23 ENCOUNTER — Other Ambulatory Visit: Payer: Self-pay | Admitting: Family Medicine

## 2019-09-23 MED ORDER — SITAGLIPTIN PHOSPHATE 100 MG PO TABS: ORAL_TABLET | ORAL | 3 refills | Status: DC

## 2019-09-24 ENCOUNTER — Ambulatory Visit: Payer: 59

## 2019-09-24 ENCOUNTER — Ambulatory Visit: Payer: 59 | Attending: Internal Medicine

## 2019-09-24 ENCOUNTER — Telehealth: Payer: Self-pay | Admitting: Family Medicine

## 2019-09-24 DIAGNOSIS — Z23 Encounter for immunization: Secondary | ICD-10-CM

## 2019-09-24 NOTE — Progress Notes (Signed)
   Covid-19 Vaccination Clinic  Name:  Hayley Fox    MRN: [id number] DOB: 1956/02/05  09/24/2019  [name] was observed post Covid-19 immunization for 15 minutes without incident. She was provided with Vaccine Information Sheet and instruction to access the V-Safe system.   [name] was instructed to call 911 with any severe reactions post vaccine: Marland Kitchen Difficulty breathing  . Swelling of face and throat  . A fast heartbeat  . A bad rash all over body  . Dizziness and weakness   Immunizations Administered    Name Date Dose VIS Date Route   Pfizer COVID-19 Vaccine 09/24/2019 12:09 PM 0.3 mL 05/31/2019 Intramuscular   Manufacturer: [place]   Lot: [id number]   [place]: [id number]

## 2019-09-24 NOTE — Telephone Encounter (Signed)
Medication:Metformin  Pharmacy: [facility]  Phone: [phone]

## 2019-09-25 NOTE — Telephone Encounter (Signed)
Needs to complete bloodwork that was previously ordered. Will consider refill until follow up in office pending these results.

## 2019-09-25 NOTE — Telephone Encounter (Signed)
Left a message for the pt to return my call.  

## 2019-09-26 NOTE — Telephone Encounter (Signed)
I called the pt and informed her of the message below.  Patient scheduled a lab appt for 4/9 to arrive at 7:45am.  I also advised the pt Dr Ethlyn Gallery received forms from [place], the Rx was attached for Januvia and she stated the pt needs to date her signature.  Patient agreed to complete this when she comes in tomorrow and a note was attached to return the forms back to me in order to mail.

## 2019-09-26 NOTE — Telephone Encounter (Signed)
Pt is returning your call from yesterday. Thanks

## 2019-09-27 ENCOUNTER — Other Ambulatory Visit: Payer: Self-pay

## 2019-09-27 ENCOUNTER — Other Ambulatory Visit (INDEPENDENT_AMBULATORY_CARE_PROVIDER_SITE_OTHER): Payer: 59

## 2019-09-27 DIAGNOSIS — E785 Hyperlipidemia, unspecified: Secondary | ICD-10-CM

## 2019-09-27 DIAGNOSIS — I10 Essential (primary) hypertension: Secondary | ICD-10-CM

## 2019-09-27 DIAGNOSIS — E119 Type 2 diabetes mellitus without complications: Secondary | ICD-10-CM | POA: Diagnosis not present

## 2019-09-27 LAB — CBC WITH DIFFERENTIAL/PLATELET
Basophils Absolute: 0.1 10*3/uL (ref 0.0–0.1)
Basophils Relative: 2.1 % (ref 0.0–3.0)
Eosinophils Absolute: 0.1 10*3/uL (ref 0.0–0.7)
Eosinophils Relative: 2.9 % (ref 0.0–5.0)
HCT: 36 % (ref 36.0–46.0)
Hemoglobin: 11.8 g/dL — ABNORMAL LOW (ref 12.0–15.0)
Lymphocytes Relative: 40.2 % (ref 12.0–46.0)
Lymphs Abs: 2.1 10*3/uL (ref 0.7–4.0)
MCHC: 32.7 g/dL (ref 30.0–36.0)
MCV: 79.1 fl (ref 78.0–100.0)
Monocytes Absolute: 0.5 10*3/uL (ref 0.1–1.0)
Monocytes Relative: 10 % (ref 3.0–12.0)
Neutro Abs: 2.3 10*3/uL (ref 1.4–7.7)
Neutrophils Relative %: 44.8 % (ref 43.0–77.0)
Platelets: 264 10*3/uL (ref 150.0–400.0)
RBC: 4.56 Mil/uL (ref 3.87–5.11)
RDW: 13.8 % (ref 11.5–15.5)
WBC: 5.2 10*3/uL (ref 4.0–10.5)

## 2019-09-27 LAB — COMPREHENSIVE METABOLIC PANEL
ALT: 23 U/L (ref 0–35)
AST: 16 U/L (ref 0–37)
Albumin: 4.7 g/dL (ref 3.5–5.2)
Alkaline Phosphatase: 120 U/L — ABNORMAL HIGH (ref 39–117)
BUN: 15 mg/dL (ref 6–23)
CO2: 28 mEq/L (ref 19–32)
Calcium: 9.3 mg/dL (ref 8.4–10.5)
Chloride: 101 mEq/L (ref 96–112)
Creatinine, Ser: 0.7 mg/dL (ref 0.40–1.20)
GFR: 101.97 mL/min (ref >60.00)
Glucose, Bld: 259 mg/dL — ABNORMAL HIGH (ref 70–99)
Potassium: 4.4 mEq/L (ref 3.5–5.1)
Sodium: 138 mEq/L (ref 135–145)
Total Bilirubin: 0.6 mg/dL (ref 0.2–1.2)
Total Protein: 7.3 g/dL (ref 6.0–8.3)

## 2019-09-27 LAB — LIPID PANEL
Cholesterol: 158 mg/dL (ref 0–200)
HDL: 54.8 mg/dL (ref >39.00)
LDL Cholesterol: 81 mg/dL (ref 0–99)
NonHDL: 103.59
Total CHOL/HDL Ratio: 3
Triglycerides: 113 mg/dL (ref 0.0–149.0)
VLDL: 22.6 mg/dL (ref 0.0–40.0)

## 2019-09-27 LAB — MICROALBUMIN / CREATININE URINE RATIO
Creatinine,U: 50.8 mg/dL
Microalb Creat Ratio: 7.8 mg/g (ref 0.0–30.0)
Microalb, Ur: 4 mg/dL — ABNORMAL HIGH (ref 0.0–1.9)

## 2019-09-27 LAB — HEMOGLOBIN A1C: Hgb A1c MFr Bld: 11.7 % — ABNORMAL HIGH (ref 4.6–6.5)

## 2019-09-27 LAB — TSH: TSH: 2.21 u[IU]/mL (ref 0.35–4.50)

## 2019-09-27 NOTE — Telephone Encounter (Signed)
Hayley Fox returned the paperwork to me that was dated by the pt.  Paperwork, along with the Rx was mailed to the address on the form and a copy was sent to be scanned.

## 2019-10-03 ENCOUNTER — Other Ambulatory Visit: Payer: Self-pay | Admitting: Family Medicine

## 2019-10-03 MED ORDER — METFORMIN HCL ER 500 MG PO TB24: 500.0000 mg | ORAL_TABLET | Freq: Two times a day (BID) | ORAL | 1 refills | Status: DC

## 2019-10-04 ENCOUNTER — Other Ambulatory Visit: Payer: Self-pay | Admitting: Family Medicine

## 2019-10-04 DIAGNOSIS — E119 Type 2 diabetes mellitus without complications: Secondary | ICD-10-CM

## 2019-10-14 ENCOUNTER — Other Ambulatory Visit: Payer: 59

## 2019-10-24 ENCOUNTER — Other Ambulatory Visit: Payer: Self-pay

## 2019-10-25 ENCOUNTER — Encounter: Payer: Self-pay | Admitting: Family Medicine

## 2019-10-25 ENCOUNTER — Other Ambulatory Visit (HOSPITAL_COMMUNITY)
Admission: RE | Admit: 2019-10-25 | Discharge: 2019-10-25 | Disposition: A | Discharge status: Home / Self Care | Payer: 59 | Source: Ambulatory Visit | Attending: Family Medicine | Admitting: Family Medicine

## 2019-10-25 ENCOUNTER — Ambulatory Visit (INDEPENDENT_AMBULATORY_CARE_PROVIDER_SITE_OTHER): Payer: 59 | Admitting: Family Medicine

## 2019-10-25 VITALS — BP 128/80 | HR 84 | Temp 97.9°F | Ht 64.25 in | Wt 172.6 lb

## 2019-10-25 DIAGNOSIS — Z0000 Encounter for general adult medical examination without abnormal findings: Secondary | ICD-10-CM

## 2019-10-25 DIAGNOSIS — L819 Disorder of pigmentation, unspecified: Secondary | ICD-10-CM

## 2019-10-25 DIAGNOSIS — H9190 Unspecified hearing loss, unspecified ear: Secondary | ICD-10-CM

## 2019-10-25 DIAGNOSIS — E1100 Type 2 diabetes mellitus with hyperosmolarity without nonketotic hyperglycemic-hyperosmolar coma (NKHHC): Secondary | ICD-10-CM

## 2019-10-25 DIAGNOSIS — E1165 Type 2 diabetes mellitus with hyperglycemia: Secondary | ICD-10-CM

## 2019-10-25 DIAGNOSIS — Z124 Encounter for screening for malignant neoplasm of cervix: Secondary | ICD-10-CM

## 2019-10-25 DIAGNOSIS — I10 Essential (primary) hypertension: Secondary | ICD-10-CM | POA: Diagnosis not present

## 2019-10-25 DIAGNOSIS — M542 Cervicalgia: Secondary | ICD-10-CM

## 2019-10-25 MED ORDER — JARDIANCE 10 MG PO TABS: 10.0000 mg | ORAL_TABLET | Freq: Every day | ORAL | 1 refills | Status: DC

## 2019-10-25 NOTE — Patient Instructions (Addendum)
Schedule mammogram  Schedule follow up visit with Dr. Kelton Pillar  *have son go to the jardiance website and see if you can get coupon/savings card for medication. Get this before taking prescription to pharmacy. If that doesn't work - there is a patient assistance through [employer] - call 1-800 number and see if you qualify.

## 2019-10-25 NOTE — Progress Notes (Signed)
Hayley Fox DOB: Mar 12, 1956 Encounter date: 10/25/2019  This is a 64 y.o. female who presents for complete physical   History of present illness/Additional concerns: States that since last year has had right side hurting including stiffness of neck. A little better today, but there are times it is hard to turn neck and she gets some popping with turning neck. Hasn't hurt neck in the past that she recalls. Has been bothering her for last year. Worries that she has had stroke and didn't know. Has been times she felt that head was too heavy. More arm and neck. Pain can be quite significant. Some numbness/tingling in right hand. No changes in vision/just subtle change in rx.   Diabetes:A1C April was 11.7; no appointment scheduled with endo. She is taking glipizide regularly and metformin. Hayley Fox is too expensive for her. Over $300.   Hypertension: on amlodipine 10mg  daily.  Hyperlipidemia: Crestor 10mg  daily.   Last pap was 04/2014: (due for repeat) Lasts mammogram was 2018: she is going to schedule after she completes the COVID shot.  Colonoscopy due:12/2026  Past Medical History:  Diagnosis Date  . Actinic keratosis 03/15/2012  . Anemia   . Blood transfusion without reported diagnosis   . Childhood asthma    resolved  . Diabetes mellitus without complication ([place])   . Hyperlipidemia   . Hypertension   . 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 CESAREAN SECTION    . COLONOSCOPY  12/2016   negative screening   No Known Allergies Current Meds  Medication Sig  . amLODipine (NORVASC) 10 MG tablet TAKE 1 TABLET BY MOUTH EVERY DAILY  . Continuous Blood Gluc Receiver (FREESTYLE LIBRE 14 DAY READER) DEVI USE TO TEST BLOOD SUGAR  . Continuous Blood Gluc Sensor (FREESTYLE LIBRE 14 DAY SENSOR) MISC 1 Device by Does not apply route as directed.  Marland Kitchen glipiZIDE (GLUCOTROL) 5 MG tablet TAKE 2 TABLETS  BY MOUTH DAILY BEFORE BREAKFAST AND 1 TABLET WITH SUPPER (Patient taking differently: 5 mg 2 (two) times daily before a meal. )  . losartan (COZAAR) 50 MG tablet Take 1 tablet (50 mg total) by mouth daily.  . metFORMIN (GLUCOPHAGE XR) 500 MG 24 hr tablet Take 1 tablet (500 mg total) by mouth 2 (two) times daily with a meal.  . Multiple Vitamins-Minerals (ONE-A-DAY 50 PLUS PO) Take by mouth.  . rosuvastatin (CRESTOR) 10 MG tablet Take 1 tablet (10 mg total) by mouth daily.  . [DISCONTINUED] Probiotic Product (PRO-BIOTIC BLEND PO) Take 1 tablet by mouth daily.   . [DISCONTINUED] sitaGLIPtin (JANUVIA) 100 MG tablet TAKE 1 TABLET(100 MG) BY MOUTH DAILY  . [DISCONTINUED] triamcinolone ointment (KENALOG) 0.5 % Apply 1 application topically 2 (two) times daily.   Current Facility-Administered Medications for the 10/25/19 encounter (Office Visit) with Caren Macadam, MD  Medication  . 0.9 %  sodium chloride infusion   Social History   Tobacco Use  . Smoking status: Former Smoker    Years: 1.00    Types: Cigarettes  . Smokeless tobacco: Never Used  Substance Use Topics  . Alcohol use: Yes    Comment: socially-once a month per pt.   Family History  Problem Relation Age of Onset  . Diabetes Father   . Hypertension Father   . Hypertension Mother   . Alcohol abuse Mother   . Cirrhosis Mother   . Diabetes Brother   .  High blood pressure Brother   . High Cholesterol Brother   . Colon cancer Neg Hx      Review of Systems  Constitutional: Negative for activity change, appetite change, chills, fatigue, fever and unexpected weight change.  HENT: Negative for congestion, ear pain, hearing loss, sinus pressure, sinus pain, sore throat and trouble swallowing.   Eyes: Negative for pain and visual disturbance.  Respiratory: Negative for cough, chest tightness, shortness of breath and wheezing.   Cardiovascular: Negative for chest pain, palpitations and leg swelling.  Gastrointestinal: Negative  for abdominal pain, blood in stool, constipation, diarrhea, nausea and vomiting.  Genitourinary: Negative for difficulty urinating and menstrual problem.  Musculoskeletal: Negative for arthralgias and back pain.  Skin: Negative for rash.  Neurological: Negative for dizziness, weakness, numbness and headaches.  Hematological: Negative for adenopathy. Does not bruise/bleed easily.  Psychiatric/Behavioral: Negative for sleep disturbance and suicidal ideas. The patient is not nervous/anxious.     CBC:  Lab Results  Component Value Date   WBC 5.2 09/27/2019   HGB 11.8 (L) 09/27/2019   HCT 36.0 09/27/2019   MCH 25.6 (L) 04/21/2017   MCHC 32.7 09/27/2019   RDW 13.8 09/27/2019   PLT 264.0 09/27/2019   MPV 11.3 04/21/2017   CMP: Lab Results  Component Value Date   NA 138 09/27/2019   K 4.4 09/27/2019   CL 101 09/27/2019   CO2 28 09/27/2019   ANIONGAP 10 10/02/2015   GLUCOSE 259 (H) 09/27/2019   BUN 15 09/27/2019   CREATININE 0.70 09/27/2019   CREATININE 1.39 (H) 04/21/2017   GFRAA 81 07/11/2016   CALCIUM 9.3 09/27/2019   PROT 7.3 09/27/2019   BILITOT 0.6 09/27/2019   ALKPHOS 120 (H) 09/27/2019   ALT 23 09/27/2019   AST 16 09/27/2019   LIPID: Lab Results  Component Value Date   CHOL 158 09/27/2019   TRIG 113.0 09/27/2019   HDL 54.80 09/27/2019   LDLCALC 81 09/27/2019    Objective:  BP 128/80 (BP Location: Left Arm, Patient Position: Sitting, Cuff Size: Normal)   Pulse 84   Temp 97.9 F (36.6 C) (Temporal)   Ht 5' 4.25" (1.632 m)   Wt 172 lb 9.6 oz (78.3 kg)   LMP 07/29/2011   SpO2 98%   BMI 29.40 kg/m   Weight: 172 lb 9.6 oz (78.3 kg)   BP Readings from Last 3 Encounters:  10/25/19 128/80  02/06/19 114/64  09/05/18 110/70   Wt Readings from Last 3 Encounters:  10/25/19 172 lb 9.6 oz (78.3 kg)  02/06/19 183 lb 9.6 oz (83.3 kg)  04/20/18 178 lb 4.8 oz (80.9 kg)    Physical Exam Exam conducted with a chaperone present.  Constitutional:      General: She  is not in acute distress.    Appearance: She is well-developed.  HENT:     Head: Normocephalic and atraumatic.      Comments: Hyperpigmentation/brown macules    Right Ear: External ear normal.     Left Ear: External ear normal.     Mouth/Throat:     Pharynx: No oropharyngeal exudate.  Eyes:     Conjunctiva/sclera: Conjunctivae normal.     Pupils: Pupils are equal, round, and reactive to light.  Neck:     Thyroid: No thyromegaly.  Cardiovascular:     Rate and Rhythm: Normal rate and regular rhythm.     Heart sounds: Normal heart sounds. No murmur. No friction rub. No gallop.   Pulmonary:     Effort:  Pulmonary effort is normal.     Breath sounds: Normal breath sounds.  Abdominal:     General: Bowel sounds are normal. There is no distension.     Palpations: Abdomen is soft. There is no mass.     Tenderness: There is no abdominal tenderness. There is no guarding.     Hernia: No hernia is present.  Genitourinary:    General: Normal vulva.     Exam position: Supine.     Labia:        Right: No rash or tenderness.        Left: No rash or tenderness.      Urethra: No prolapse.  Musculoskeletal:        General: No tenderness or deformity. Normal range of motion.     Cervical back: Normal range of motion and neck supple.  Lymphadenopathy:     Cervical: No cervical adenopathy.     Lower Body: No right inguinal adenopathy. No left inguinal adenopathy.  Skin:    General: Skin is warm and dry.     Findings: No rash.  Neurological:     Mental Status: She is alert and oriented to person, place, and time.     Motor: No weakness.     Deep Tendon Reflexes: Reflexes normal.     Reflex Scores:      Tricep reflexes are 2+ on the right side and 2+ on the left side.      Bicep reflexes are 2+ on the right side and 2+ on the left side.      Brachioradialis reflexes are 2+ on the right side and 2+ on the left side.      Patellar reflexes are 2+ on the right side and 2+ on the left side.     Comments: There is some spasm trap on right  Psychiatric:        Speech: Speech normal.        Behavior: Behavior normal.        Thought Content: Thought content normal.     Assessment/Plan: Health Maintenance Due  Topic Date Due  . PNEUMOCOCCAL POLYSACCHARIDE VACCINE AGE 47-64 HIGH RISK  Never done  . PAP SMEAR-Modifier  05/19/2017   Health Maintenance reviewed.  1. Preventative health care She will schedule mammogram. Discussed importance of healthy eating, regular exercise.  2. Essential hypertension Well controlled. Continue current medication.  3. Hyperpigmentation - Ambulatory referral to Dermatology  4. Hearing loss, unspecified hearing loss type, unspecified laterality Self reported; she is ready to see specialist for evaluation. - Ambulatory referral to Audiology  5. DM hyperosmolarity type II, uncontrolled ([place]) I gave her information for son to help her with either getting coupon or getting patient assistance. - Hemoglobin A1c; Future - HM DIABETES FOOT EXAM  6. Neck pain Will start with xray but suspect with PT/stretching she can improve on this discomfort. - DG Cervical Spine Complete; Future  7. Cervical cancer screening - PAP [[place]]   Return for cervical spine xr when able; repeat A1C in July through our lab. follow up visit pending xr results.  Micheline Rough, MD

## 2019-10-28 LAB — CYTOLOGY - PAP
Comment: NEGATIVE
Diagnosis: NEGATIVE
High risk HPV: NEGATIVE

## 2019-10-29 ENCOUNTER — Telehealth: Payer: Self-pay | Admitting: Family Medicine

## 2019-10-29 NOTE — Telephone Encounter (Signed)
The patient was told to call to let Dr. Ethlyn Gallery know if she has a problem with the new medication that she prescribed for her.   empagliflozin (JARDIANCE) 10 MG TABS tablet  With insurance $1,200  With the coupon $1,000  The patient just wanted to let Dr. Ethlyn Gallery know that she can't afford this medication and is wanting to know if there is another Rx that can be called in for her that she can afford.  Please advise

## 2019-10-30 NOTE — Telephone Encounter (Signed)
So frustrating. Did they try to call the pharmaceutical company 1-800 number to see if she qualifies for patient assistance? Let me know outcome of this or if she needs help with that part let me know. We will come up with something she can afford!

## 2019-10-30 NOTE — Telephone Encounter (Signed)
Left message on machine for patient to return our call 

## 2019-10-31 NOTE — Telephone Encounter (Signed)
Left a detailed message at the pts cell number with the information below. 

## 2019-11-07 MED ORDER — JARDIANCE 10 MG PO TABS: 10.0000 mg | ORAL_TABLET | Freq: Every day | ORAL | 1 refills | Status: DC

## 2019-11-07 NOTE — Telephone Encounter (Signed)
Okay.  Please send in a new prescription for the Januvia if she needs one  Please also make sure that she has called the actual pharmaceutical company themselves to make sure that she does not qualify for an even bigger discount or potentially free medication.  All pharmaceutical companies offer some sort of assistance in this way.  I will state that the Jardiance does tend to do a better job with sugar lowering as well as cardiac benefits.  It also helps with weight loss.  There is some risk of yeast infections, but I have not seen this to be a problem for patients.  I agree that the cost of the Jardiance for her is way too much, but I just wanted to clarify the above.  For januvia: the patient assistance is through [employer] and the number is: [phone]  For jardiance: the patient assistance is through [employer] and the number is: [phone]  She (or son) might have already called these. But she needs to ask if she qualifies for patient assistance based on income (not just about coupons). If she has already done this that is fine! Just wanting to look at all options! Both companies have other diabetic meds as well; so if she qualified it would open up some other opportunities if we needed them in the future.

## 2019-11-07 NOTE — Telephone Encounter (Signed)
Spoke with the pt and she stated she called the number on the card, called and was told it would only be $10 off and she read the side effects which stated this can cause a yeast infection.  Stated insurance agent has program in which she can get Januvia for only $50 instead and she will continue this.  Message sent to PCP.

## 2019-11-07 NOTE — Addendum Note (Signed)
Addended by: Agnes Lawrence on: 11/07/2019 01:31 PM   Modules accepted: Orders

## 2019-11-07 NOTE — Telephone Encounter (Signed)
Spoke with the pt and informed her of the message below.  Patient stated she does not need the number for Januvia as she can get this for only $50 and requested a new Rx for Jardiance as she gave this to the pharmacist.  Patient is aware the Rx was printed and will be left at the front desk for pick up on 5/21 when Dr Ethlyn Gallery returns to the office. Message sent to PCP.

## 2019-12-06 ENCOUNTER — Other Ambulatory Visit: Payer: Self-pay | Admitting: *Deleted

## 2019-12-06 DIAGNOSIS — E785 Hyperlipidemia, unspecified: Secondary | ICD-10-CM

## 2019-12-06 MED ORDER — ROSUVASTATIN CALCIUM 10 MG PO TABS: 10.0000 mg | ORAL_TABLET | Freq: Every day | ORAL | 1 refills | Status: DC

## 2019-12-24 ENCOUNTER — Other Ambulatory Visit: Payer: 59

## 2019-12-25 ENCOUNTER — Other Ambulatory Visit: Payer: 59

## 2020-01-01 ENCOUNTER — Other Ambulatory Visit: Payer: Self-pay

## 2020-01-01 ENCOUNTER — Other Ambulatory Visit (INDEPENDENT_AMBULATORY_CARE_PROVIDER_SITE_OTHER): Payer: 59

## 2020-01-01 DIAGNOSIS — E1165 Type 2 diabetes mellitus with hyperglycemia: Secondary | ICD-10-CM

## 2020-01-01 DIAGNOSIS — E1100 Type 2 diabetes mellitus with hyperosmolarity without nonketotic hyperglycemic-hyperosmolar coma (NKHHC): Secondary | ICD-10-CM

## 2020-01-01 NOTE — Addendum Note (Signed)
Addended by: Marrion Coy on: 01/01/2020 10:21 AM   Modules accepted: Orders

## 2020-01-02 LAB — HEMOGLOBIN A1C
Hgb A1c MFr Bld: 8.9 % of total Hgb — ABNORMAL HIGH (ref <5.7)
Mean Plasma Glucose: 209 (calc)
eAG (mmol/L): 11.6 (calc)

## 2020-01-03 ENCOUNTER — Telehealth: Payer: Self-pay | Admitting: Family Medicine

## 2020-01-03 NOTE — Telephone Encounter (Signed)
See results note. 

## 2020-01-03 NOTE — Telephone Encounter (Signed)
Pt is returning your call. Thanks

## 2020-01-14 ENCOUNTER — Ambulatory Visit (INDEPENDENT_AMBULATORY_CARE_PROVIDER_SITE_OTHER): Payer: 59 | Admitting: Family Medicine

## 2020-01-14 ENCOUNTER — Encounter: Payer: Self-pay | Admitting: Family Medicine

## 2020-01-14 ENCOUNTER — Other Ambulatory Visit: Payer: Self-pay

## 2020-01-14 VITALS — BP 126/70 | HR 88 | Temp 98.6°F | Wt 171.2 lb

## 2020-01-14 DIAGNOSIS — R350 Frequency of micturition: Secondary | ICD-10-CM

## 2020-01-14 DIAGNOSIS — M545 Low back pain, unspecified: Secondary | ICD-10-CM

## 2020-01-14 LAB — POCT URINALYSIS DIPSTICK
Bilirubin, UA: NEGATIVE
Blood, UA: NEGATIVE
Glucose, UA: NEGATIVE
Ketones, UA: NEGATIVE
Leukocytes, UA: NEGATIVE
Nitrite, UA: NEGATIVE
Protein, UA: NEGATIVE
Spec Grav, UA: 1.025 (ref 1.010–1.025)
Urobilinogen, UA: 0.2 E.U./dL
pH, UA: 6 (ref 5.0–8.0)

## 2020-01-14 MED ORDER — METHYLPREDNISOLONE 4 MG PO TBPK
ORAL_TABLET | ORAL | 0 refills | Status: DC
Start: 2020-01-14 — End: 2020-04-10

## 2020-01-14 MED ORDER — CYCLOBENZAPRINE HCL 10 MG PO TABS
10.0000 mg | ORAL_TABLET | Freq: Three times a day (TID) | ORAL | 0 refills | Status: DC | PRN
Start: 2020-01-14 — End: 2020-10-16

## 2020-01-14 NOTE — Progress Notes (Signed)
   Subjective:    Patient ID: Hayley Fox, female    DOB: 06-Dec-1955, 64 y.o.   MRN: [id number]  HPI Here for 3 days of sharp lower back pain. She woke up this way a few mornings ago. No recent trauma. The pain does not radiate down the legs. The pain goes all the way across the lower back. She has tried Tylenol and BioFreeze with no relief. No urinary symptoms.    Review of Systems  Constitutional: Negative.   Respiratory: Negative.   Cardiovascular: Negative.   Gastrointestinal: Negative.   Genitourinary: Negative.   Musculoskeletal: Positive for back pain.       Objective:   Physical Exam Constitutional:      Comments: In pain   Cardiovascular:     Rate and Rhythm: Normal rate and regular rhythm.     Pulses: Normal pulses.     Heart sounds: Normal heart sounds.  Pulmonary:     Effort: Pulmonary effort is normal.     Breath sounds: Normal breath sounds.  Musculoskeletal:     Comments: Very tender over the lower back, both over the spine and to each side of the spine. Spasm is present. ROM is full. SLR are negative   Neurological:     Mental Status: She is alert.           Assessment & Plan:  Lumbar pain. She will try heat, Flexeril, and a Medrol dose pack. Recheck prn.  Alysia Penna, MD

## 2020-02-05 ENCOUNTER — Other Ambulatory Visit: Payer: Self-pay | Admitting: Family Medicine

## 2020-02-05 ENCOUNTER — Other Ambulatory Visit: Payer: Self-pay | Admitting: Internal Medicine

## 2020-04-10 ENCOUNTER — Other Ambulatory Visit: Payer: Self-pay

## 2020-04-10 ENCOUNTER — Encounter: Payer: Self-pay | Admitting: Family Medicine

## 2020-04-10 ENCOUNTER — Ambulatory Visit (INDEPENDENT_AMBULATORY_CARE_PROVIDER_SITE_OTHER): Payer: 59 | Admitting: Family Medicine

## 2020-04-10 VITALS — BP 120/62 | HR 92 | Temp 98.5°F | Ht 64.25 in | Wt 169.6 lb

## 2020-04-10 DIAGNOSIS — E1165 Type 2 diabetes mellitus with hyperglycemia: Secondary | ICD-10-CM | POA: Diagnosis not present

## 2020-04-10 DIAGNOSIS — E785 Hyperlipidemia, unspecified: Secondary | ICD-10-CM | POA: Diagnosis not present

## 2020-04-10 DIAGNOSIS — I10 Essential (primary) hypertension: Secondary | ICD-10-CM

## 2020-04-10 DIAGNOSIS — Z1231 Encounter for screening mammogram for malignant neoplasm of breast: Secondary | ICD-10-CM

## 2020-04-10 LAB — POCT GLYCOSYLATED HEMOGLOBIN (HGB A1C): Hemoglobin A1C: 6.8 % — AB (ref 4.0–5.6)

## 2020-04-10 MED ORDER — FREESTYLE LIBRE 14 DAY SENSOR MISC: 1.0000 | 6 refills | Status: DC

## 2020-04-10 NOTE — Progress Notes (Signed)
Hayley Fox DOB: [birth year] Encounter date: 04/10/2020  This is a 64 y.o. female who presents with No chief complaint on file.   History of present illness: Saw Dr. Sarajane Jews on 01/14/2020 for back pain; this is better. Last visit with me was 10/2019.  Uncontrolled type 2 diabetes: A1c in 09/2019 was 11.7.  She had not been following regularly with endocrinology.  Recheck A1c on 7/14 was 8.9. she cut out carbs and sweets. Trying to work on [employer]. Only stime she gets starch is through corn and beans. POC A1C today is 6.8.  Jardiance 10 mg daily was also added back in May.  She is currently taking glipizide 5 mg twice daily, Metformin XR 500 mg twice daily.  No problems with hypoglycemia.  She has been out of sensors, so has not been checking her sugars regularly.  Hypertension: not checking at home; taking losartan 50mg  daily.   Hyperlipidemia: taking crestor and doing fine with this. No muscle aches cramps.   Some anterior left leg pain. No swelling; just hurts on and off. Sometimes wiht walking.   She had complained of neck pain, but I do not see that she completed cervical spine x-ray that was ordered. States that neck pain is better.   Did not complete mammogram (at last visit was waiting to schedule until she completed Covid vaccination).  Normal Pap with negative HPV completed on 10/25/2019.  Colonoscopy repeat is due 12/2026.   No Known Allergies No outpatient medications have been marked as taking for the 04/10/20 encounter (Appointment) with Caren Macadam, MD.   Current Facility-Administered Medications for the 04/10/20 encounter (Appointment) with Caren Macadam, MD  Medication  . 0.9 %  sodium chloride infusion    Review of Systems  Constitutional: Negative for chills, fatigue and fever.  Respiratory: Negative for cough, chest tightness, shortness of breath and wheezing.   Cardiovascular: Negative for chest pain, palpitations and leg swelling.     Objective:  LMP 07/29/2011       BP Readings from Last 3 Encounters:  01/14/20 126/70  10/25/19 128/80  02/06/19 114/64   Wt Readings from Last 3 Encounters:  01/14/20 171 lb 3.2 oz (77.7 kg)  10/25/19 172 lb 9.6 oz (78.3 kg)  02/06/19 183 lb 9.6 oz (83.3 kg)    Physical Exam Constitutional:      General: She is not in acute distress.    Appearance: She is well-developed.  HENT:     Head: Normocephalic and atraumatic.  Cardiovascular:     Rate and Rhythm: Normal rate and regular rhythm.     Heart sounds: Normal heart sounds. No murmur heard.   Pulmonary:     Effort: Pulmonary effort is normal.     Breath sounds: Normal breath sounds.  Abdominal:     General: Bowel sounds are normal. There is no distension.     Palpations: Abdomen is soft.     Tenderness: There is no abdominal tenderness. There is no guarding.  Skin:    General: Skin is warm and dry.     Comments: Sensory exam of the foot is normal, tested with the monofilament. Good pulses, no lesions or ulcers, good peripheral pulses.  Psychiatric:        Judgment: Judgment normal.     Assessment/Plan  1. Essential hypertension Well-controlled.  Continue current amlodipine 10 mg daily, losartan 50 mg daily. - CBC with Differential/Platelet; Future - Comprehensive metabolic panel; Future  2. Controlled type 2 diabetes mellitus with hyperglycemia, without  long-term current use of insulin ([place]) So happy with her number improvement.  Continue current medications.  Stressed importance of regular exercise to help with sugar control.  Keep up the good work with healthier eating. - POC HgB A1c - Hemoglobin A1c; Future - Microalbumin / creatinine urine ratio; Future - HM DIABETES FOOT EXAM  3. Encounter for screening mammogram for malignant neoplasm of breast She states she will go ahead and complete mammogram.  She has forgotten to schedule this.  Order was put in so that they can contact her to set up. - MM  DIGITAL SCREENING BILATERAL; Future  4. Hyperlipidemia, unspecified hyperlipidemia type - Lipid panel; Future    Return in about 6 months (around 10/09/2020) for Chronic condition visit; with bloodwork prior.    Micheline Rough, MD

## 2020-05-02 ENCOUNTER — Other Ambulatory Visit: Payer: Self-pay | Admitting: Family Medicine

## 2020-05-02 DIAGNOSIS — E119 Type 2 diabetes mellitus without complications: Secondary | ICD-10-CM

## 2020-05-13 ENCOUNTER — Other Ambulatory Visit: Payer: Self-pay | Admitting: Family Medicine

## 2020-05-13 DIAGNOSIS — E785 Hyperlipidemia, unspecified: Secondary | ICD-10-CM

## 2020-05-19 ENCOUNTER — Other Ambulatory Visit: Payer: Self-pay | Admitting: Family Medicine

## 2020-05-29 ENCOUNTER — Other Ambulatory Visit: Payer: Self-pay | Admitting: Family Medicine

## 2020-06-27 ENCOUNTER — Encounter: Payer: Self-pay | Admitting: Family Medicine

## 2020-07-16 ENCOUNTER — Other Ambulatory Visit: Payer: Self-pay | Admitting: Family Medicine

## 2020-07-20 ENCOUNTER — Telehealth: Payer: Self-pay | Admitting: Family Medicine

## 2020-07-20 NOTE — Telephone Encounter (Signed)
Pt call and stated she is getting over covid and want something call in for her cough at  [id number] - [place], [place] Phone:  [phone]  Fax:  [phone]

## 2020-07-20 NOTE — Telephone Encounter (Signed)
Spoke with the pt and offered a virtual visit today with Dr Elease Hashimoto as PCP does not have openings.  Patient declined as she has to work with a patient she is taking care of.  Patient stated she tested positive 1 week ago, had a negative test after this and her agency told her the quarantine period was only 5 days.  Appt scheduled with Dr Maudie Mercury tomorrow at [facility].

## 2020-07-21 ENCOUNTER — Telehealth (INDEPENDENT_AMBULATORY_CARE_PROVIDER_SITE_OTHER): Payer: 59 | Admitting: Family Medicine

## 2020-07-21 DIAGNOSIS — R21 Rash and other nonspecific skin eruption: Secondary | ICD-10-CM

## 2020-07-21 DIAGNOSIS — U071 COVID-19: Secondary | ICD-10-CM

## 2020-07-21 DIAGNOSIS — R059 Cough, unspecified: Secondary | ICD-10-CM

## 2020-07-21 MED ORDER — BENZONATATE 100 MG PO CAPS: 100.0000 mg | ORAL_CAPSULE | Freq: Three times a day (TID) | ORAL | 0 refills | Status: DC | PRN

## 2020-07-21 MED ORDER — DOXYCYCLINE HYCLATE 100 MG PO TABS: 100.0000 mg | ORAL_TABLET | Freq: Two times a day (BID) | ORAL | 0 refills | Status: DC

## 2020-07-21 MED ORDER — ALBUTEROL SULFATE HFA 108 (90 BASE) MCG/ACT IN AERS: 2.0000 | INHALATION_SPRAY | Freq: Four times a day (QID) | RESPIRATORY_TRACT | 0 refills | Status: DC | PRN

## 2020-07-21 NOTE — Patient Instructions (Signed)
-  I sent the medication(s) we discussed to your pharmacy: Meds ordered this encounter  Medications  . benzonatate (TESSALON PERLES) 100 MG capsule    Sig: Take 1 capsule (100 mg total) by mouth 3 (three) times daily as needed.    Dispense:  20 capsule    Refill:  0  . albuterol (PROAIR HFA) 108 (90 Base) MCG/ACT inhaler    Sig: Inhale 2 puffs into the lungs every 6 (six) hours as needed for wheezing or shortness of breath.    Dispense:  1 each    Refill:  0  . doxycycline (VIBRA-TABS) 100 MG tablet    Sig: Take 1 tablet (100 mg total) by mouth 2 (two) times daily.    Dispense:  14 tablet    Refill:  0   For the cough try the Tessalon Perles per instructions.  If you have any wheezing or shortness of breath, try the albuterol 2 puffs every 6 hours as needed.  Do not use more frequently than every 4-6 hours.  Seek prompt in person care if worsening or not improving with treatment.  For the dry skin, can try Aquaphor twice daily.  If this is worsening or does not resolve over the next 3 to 4 days, please seek in person evaluation.  I hope you are feeling better soon!  Seek in person care promptly if your symptoms worsen, new concerns arise or you are not improving with treatment.  It was nice to meet you today. I help [facility] out with telemedicine visits on Tuesdays and Thursdays and am available for visits on those days. If you have any concerns or questions following this visit please schedule a follow up visit with your Primary Care doctor or seek care at a local urgent care clinic to avoid delays in care.

## 2020-07-21 NOTE — Progress Notes (Signed)
Virtual Visit via Video Note  I connected with Hayley Fox  on 07/21/20 at 10:00 AM EST by a video enabled telemedicine application and verified that I am speaking with the correct person using two identifiers.  Location patient: home, [place] Location provider:work or home office Persons participating in the virtual visit: patient, provider  I discussed the limitations of evaluation and management by telemedicine and the availability of in person appointments. The patient expressed understanding and agreed to proceed.   HPI:  Acute telemedicine visit for COVID19: -Onset: 07/10/20; tested positive for covid -Symptoms include: now mostly better, but has persistent cough - reports this happens to her whenever she gets a cold, has some SOB still when walking around a lot - but is improving the more active she is, still feels more tired then usual, food still doesn't taste normal, some sinus pain, has some thick sinus drainage and thick phlegm, occ wheezing -Denies: fevers, CP, SOB currently, vomiting, diarrhea, inability to get out of bed/eat/drink, hemoptysis -Has tried: musinex -Pertinent past medical history:diabetes, HTN -Pertinent medication allergies: nkda -COVID-19 vaccine status: fully vaccinated; did not have booster as was waiting to get her mammogram and it kept getting canceled and then was abnormal and had to go back She has another concern of itchy skin on her arm, has been scratching and has a little rash, wonders if it is from covid.  ROS: See pertinent positives and negatives per HPI.  Past Medical History:  Diagnosis Date  . Actinic keratosis 03/15/2012  . Anemia   . Blood transfusion without reported diagnosis   . Childhood asthma    resolved  . Diabetes mellitus without complication ([place])   . Hyperlipidemia   . Hypertension   . 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  CESAREAN SECTION    . COLONOSCOPY  12/2016   negative screening     Current Outpatient Medications:  .  albuterol (PROAIR HFA) 108 (90 Base) MCG/ACT inhaler, Inhale 2 puffs into the lungs every 6 (six) hours as needed for wheezing or shortness of breath., Disp: 1 each, Rfl: 0 .  benzonatate (TESSALON PERLES) 100 MG capsule, Take 1 capsule (100 mg total) by mouth 3 (three) times daily as needed., Disp: 20 capsule, Rfl: 0 .  doxycycline (VIBRA-TABS) 100 MG tablet, Take 1 tablet (100 mg total) by mouth 2 (two) times daily., Disp: 14 tablet, Rfl: 0 .  amLODipine (NORVASC) 10 MG tablet, TAKE 1 TABLET BY MOUTH ONCE DAILY, Disp: 90 tablet, Rfl: 0 .  Continuous Blood Gluc Receiver (FREESTYLE LIBRE 14 DAY READER) DEVI, USE TO TEST BLOOD SUGAR, Disp: 1 each, Rfl: 3 .  Continuous Blood Gluc Sensor (FREESTYLE LIBRE 14 DAY SENSOR) MISC, 1 Device by Does not apply route as directed., Disp: 4 each, Rfl: 6 .  cyclobenzaprine (FLEXERIL) 10 MG tablet, Take 1 tablet (10 mg total) by mouth 3 (three) times daily as needed for muscle spasms., Disp: 60 tablet, Rfl: 0 .  glipiZIDE (GLUCOTROL) 5 MG tablet, TAKE 2 TABLETS BY MOUTH DAILY BEFORE BREAKFAST AND 1 TABLET WITH SUPPER, Disp: 270 tablet, Rfl: 1 .  JARDIANCE 10 MG TABS tablet, TAKE 1 TABLET BY MOUTH DAILY, Disp: 90 tablet, Rfl: 0 .  losartan (COZAAR) 50 MG tablet, TAKE 1 TABLET BY MOUTH EVERY DAY, Disp: 90 tablet, Rfl: 1 .  metFORMIN (GLUCOPHAGE XR) 500 MG 24 hr tablet, Take 1 tablet (  500 mg total) by mouth 2 (two) times daily with a meal., Disp: 180 tablet, Rfl: 1 .  Multiple Vitamins-Minerals (ONE-A-DAY 50 PLUS PO), Take by mouth., Disp: , Rfl:  .  rosuvastatin (CRESTOR) 10 MG tablet, TAKE 1 TABLET BY MOUTH EVERY DAY, Disp: 90 tablet, Rfl: 1  Current Facility-Administered Medications:  .  0.9 %  sodium chloride infusion, 500 mL, Intravenous, Continuous, Carlean Purl, Ofilia Neas, MD  EXAM:  VITALS per patient if applicable:  GENERAL: alert, oriented, appears well  and in no acute distress  HEENT: atraumatic, conjunttiva clear, no obvious abnormalities on inspection of external nose and ears  NECK: normal movements of the head and neck  LUNGS: on inspection no signs of respiratory distress, breathing rate appears normal, no obvious gross SOB, gasping or wheezing  CV: no obvious cyanosis  SKIN:on video visit exam appears to have what looks like dry skin with some excoriations upper L arm  MS: moves all visible extremities without noticeable abnormality  PSYCH/NEURO: pleasant and cooperative, no obvious depression or anxiety, speech and thought processing grossly intact  ASSESSMENT AND PLAN:  Discussed the following assessment and plan:  COVID-19  Rash  Cough  -we discussed possible serious and likely etiologies, options for evaluation and workup, limitations of telemedicine visit vs in person visit, treatment, treatment risks and precautions. Pt prefers to treat via telemedicine empirically rather than in person at this moment.  Reports she already tested negative for Covid, on return to work.  Suspect the symptoms are post Covid/viral inflammatory symptoms.  Possible bronchitis.  Possible secondary bacterial illness.  She opted to try Tessalon for cough, albuterol as needed every 4-6 hours and we also sent a prescription for delayed antibiotic if worsening or not improving. For the skin, possible eczema or dry skin, trial of Aquaphor. Discussed potential complications and precautions.  Advised to seek prompt in person care if worsening, new symptoms arise, or if is not improving with treatment. Discussed options for inperson care if PCP office not available. Did let this patient know that I only do telemedicine on Tuesdays and Thursdays for [facility]. Advised to schedule follow up visit with PCP or [facility] if any further questions or concerns to avoid delays in care.    I discussed the assessment and treatment plan with the patient. The patient was  provided an opportunity to ask questions and all were answered. The patient agreed with the plan and demonstrated an understanding of the instructions.     Lucretia Kern, DO

## 2020-07-27 ENCOUNTER — Telehealth: Payer: Self-pay | Admitting: Family Medicine

## 2020-07-27 NOTE — Telephone Encounter (Signed)
Pt is calling in stating that she need a refill on Rx benzonatate (TESSALON PERLES) 100 MG  [facility] [address]   Pt decline an appointment for more refills stated that she just had one but the cough is not getting any better.

## 2020-07-28 ENCOUNTER — Other Ambulatory Visit: Payer: Self-pay | Admitting: Family Medicine

## 2020-07-28 MED ORDER — EMPAGLIFLOZIN 10 MG PO TABS: 10.0000 mg | ORAL_TABLET | Freq: Every day | ORAL | 0 refills | Status: DC

## 2020-07-28 MED ORDER — BENZONATATE 100 MG PO CAPS
100.0000 mg | ORAL_CAPSULE | Freq: Three times a day (TID) | ORAL | 1 refills | Status: DC | PRN
Start: 2020-07-28 — End: 2020-07-30

## 2020-07-28 NOTE — Telephone Encounter (Signed)
Spoke with the pt and informed her refills were sent to [facility].  Per pts preference [facility] and a follow up and lab appts were scheduled.

## 2020-07-28 NOTE — Telephone Encounter (Signed)
Refill sent; but let us know if not improving in the next week or certainly if any worsening of symptoms.

## 2020-07-28 NOTE — Telephone Encounter (Signed)
Pt call and stated she need a refill on  JARDIANCE 10 MG TABS tablet sent to  [id number] [place], [place] Phone:  [phone]  Fax:  [phone]

## 2020-07-29 ENCOUNTER — Other Ambulatory Visit: Payer: Self-pay | Admitting: Family Medicine

## 2020-08-03 LAB — HM MAMMOGRAPHY

## 2020-08-04 ENCOUNTER — Other Ambulatory Visit: Payer: Self-pay | Admitting: Family Medicine

## 2020-08-06 ENCOUNTER — Encounter: Payer: Self-pay | Admitting: Family Medicine

## 2020-08-10 ENCOUNTER — Telehealth: Payer: Self-pay | Admitting: Family Medicine

## 2020-08-10 NOTE — Telephone Encounter (Signed)
Spoke with the pt and scheduled an appt with Dr Elease Hashimoto on 2/22 to arrive at 1:45pm as PCP does not have any openings.

## 2020-08-10 NOTE — Telephone Encounter (Signed)
Please see if visit can be set up. Since it is a few weeks out from diagnosis of covid; we need to make sure no further work up is needed. Since she was positive for covid and it has been a month; she can be seen in the office which will help for exam.

## 2020-08-10 NOTE — Telephone Encounter (Signed)
Pt tested positive for covid 07/10/20. Pt did video visit on 07/21/20 with Dr. Maudie Mercury and was prescribe cough medicine and its not helping and still having the cough. Dr. Maudie Mercury told patient to call back if the cough still persistent.

## 2020-08-11 ENCOUNTER — Ambulatory Visit (INDEPENDENT_AMBULATORY_CARE_PROVIDER_SITE_OTHER): Payer: 59 | Admitting: Family Medicine

## 2020-08-11 ENCOUNTER — Encounter: Payer: Self-pay | Admitting: Family Medicine

## 2020-08-11 ENCOUNTER — Other Ambulatory Visit: Payer: Self-pay

## 2020-08-11 VITALS — BP 120/60 | HR 103 | Temp 98.3°F | Ht 64.0 in | Wt 169.9 lb

## 2020-08-11 DIAGNOSIS — R059 Cough, unspecified: Secondary | ICD-10-CM

## 2020-08-11 IMAGING — DX DG KNEE COMPLETE 4+V*L*
5 series · 5 of 5 positions shown · non-contrast
Comparison: None.

CLINICAL DATA: Injury 2 weeks ago.  Fall.  Continued pain

EXAM:
LEFT KNEE - COMPLETE 4+ VIEW

[knee ap (1 of 2)]
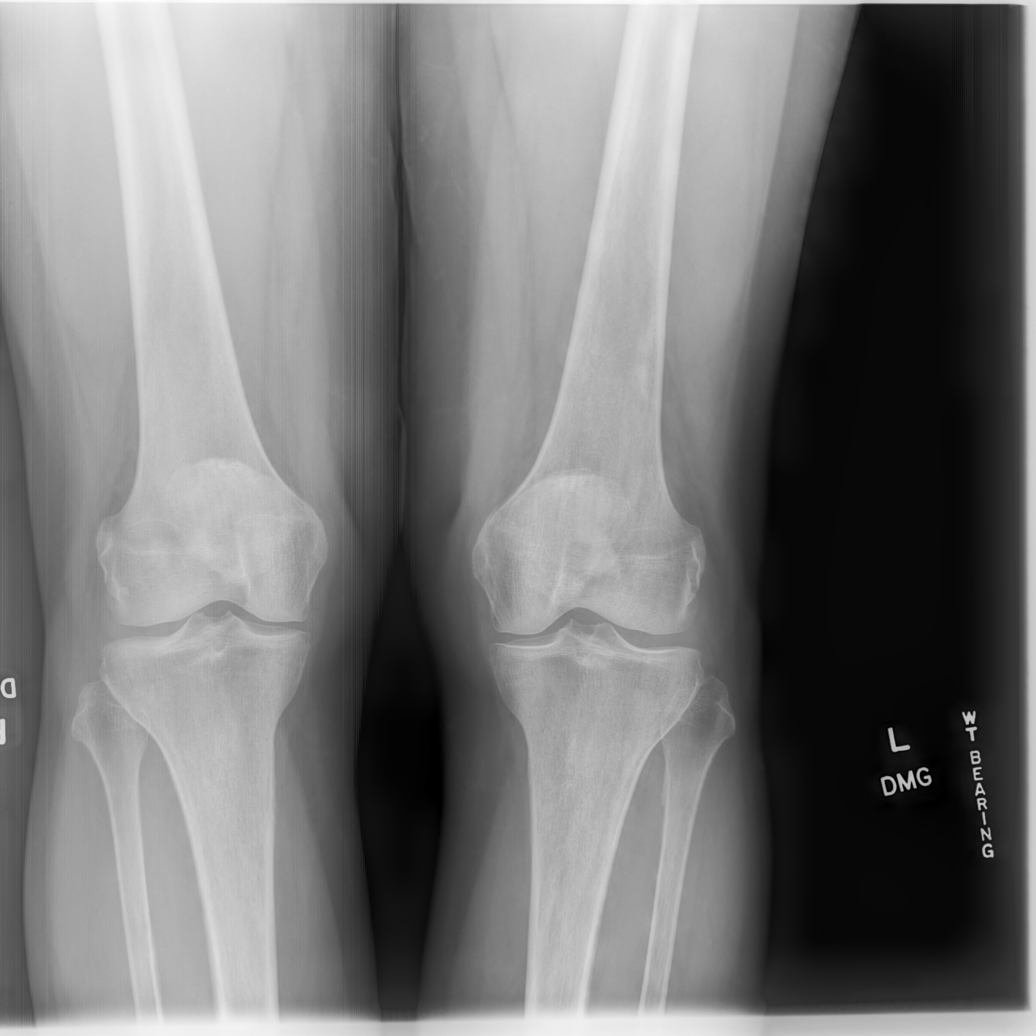

[knee lat]
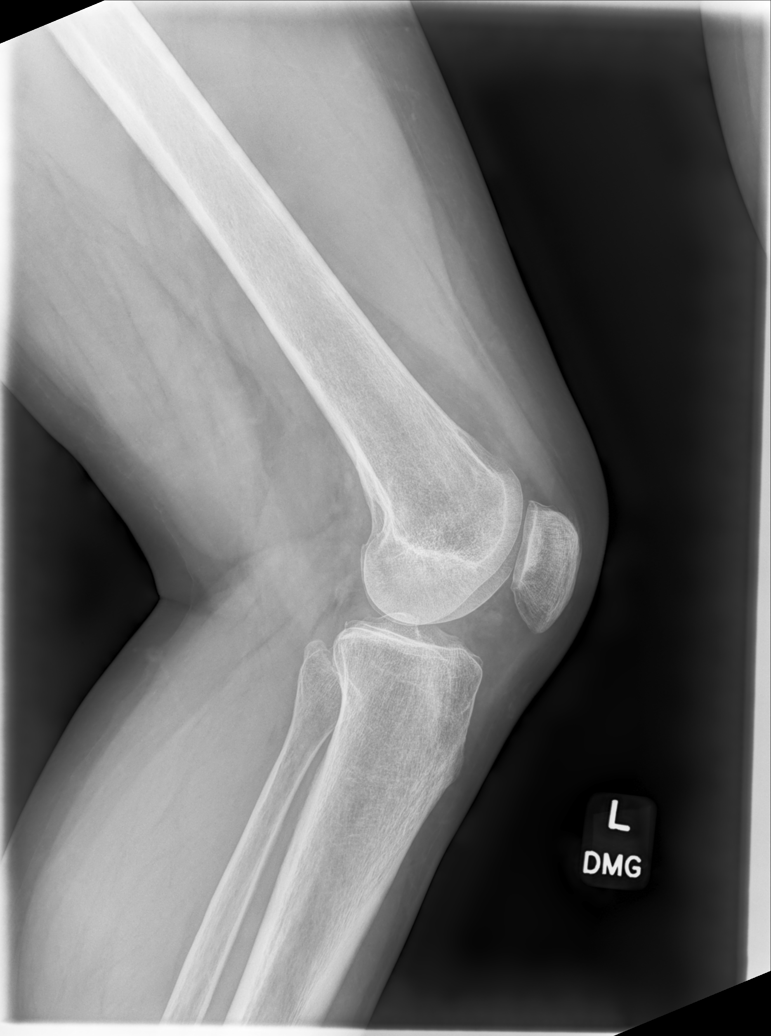

[patella (sunrise) tan]
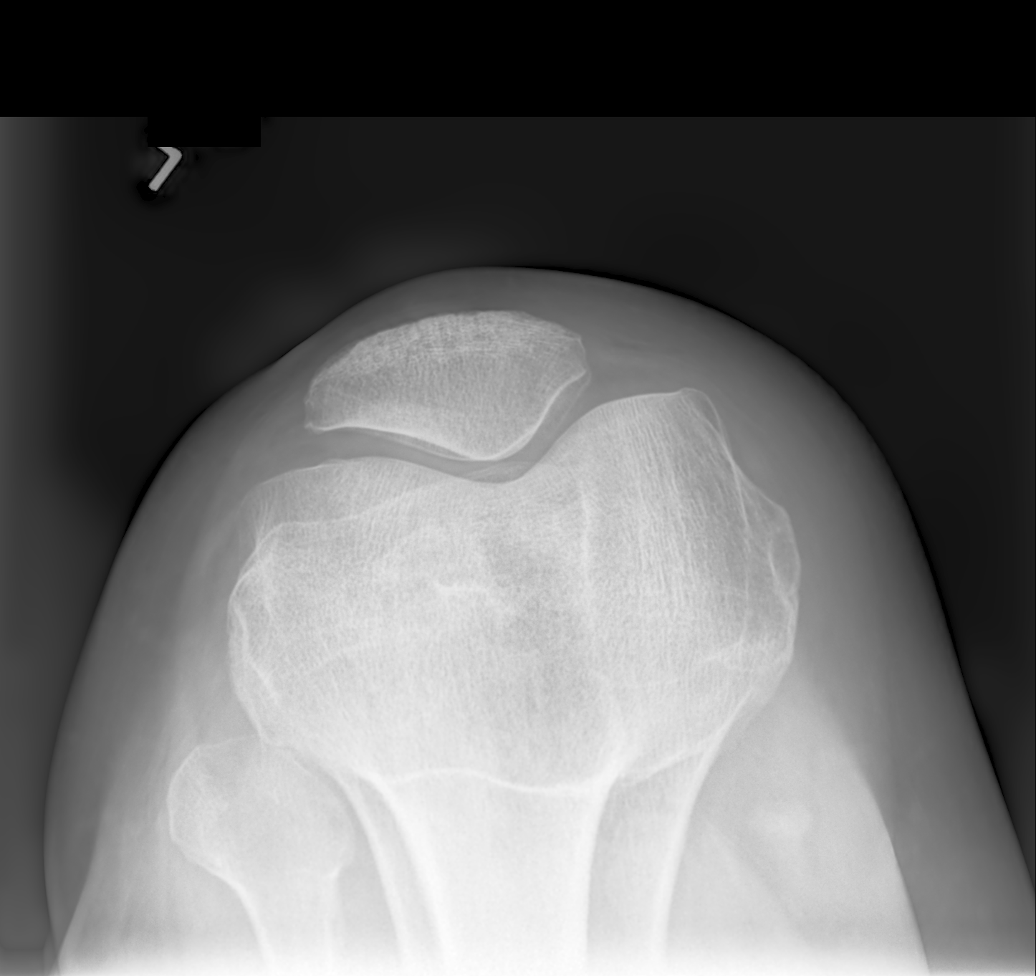

[knee ap (2 of 2)]
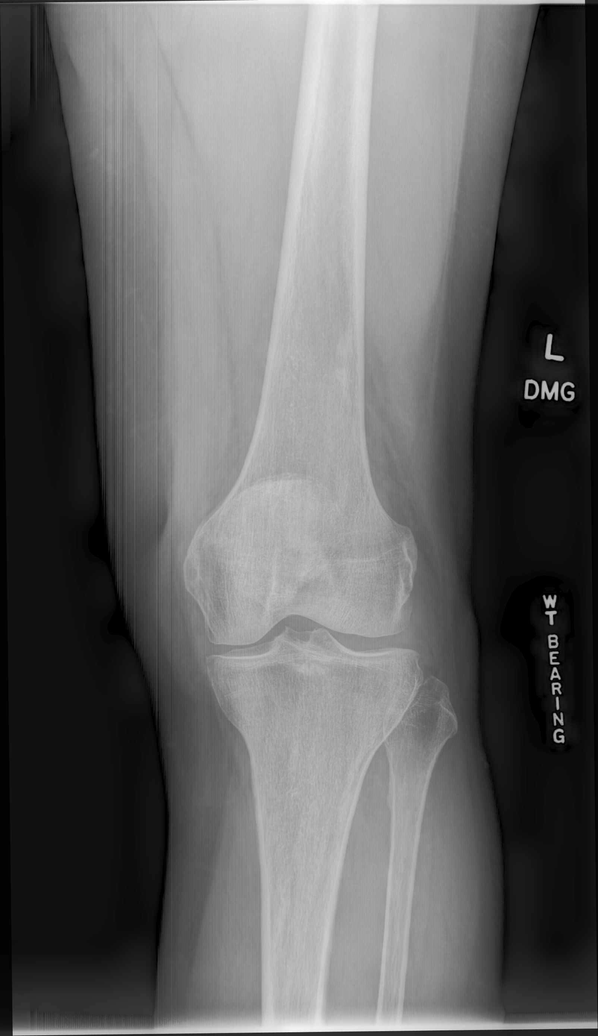

[knee [person_name] view pa]
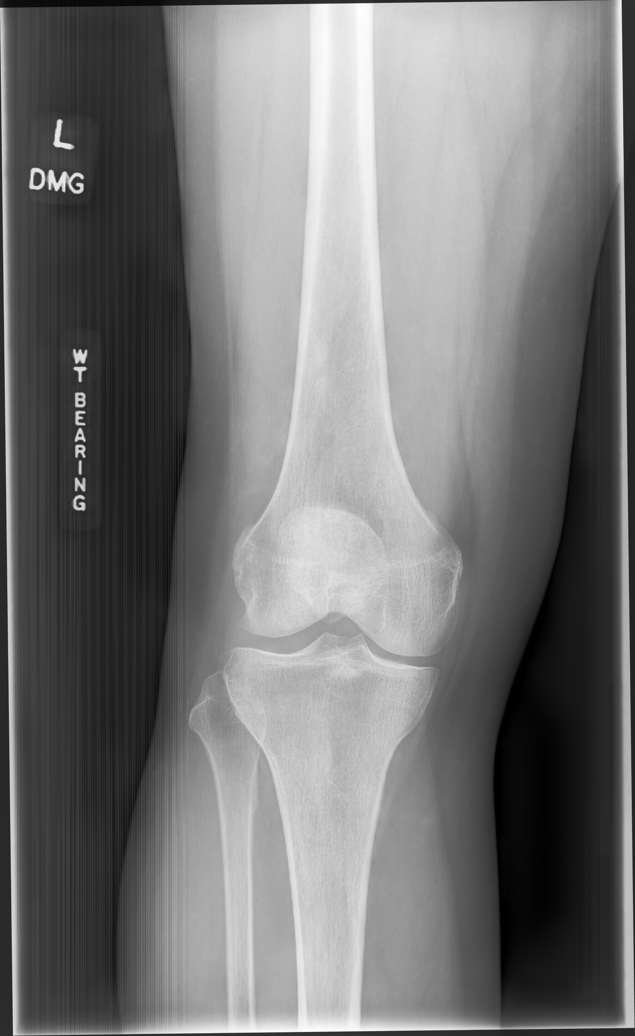

[5 of 5 positions shown; findings below may reference images not displayed]

FINDINGS: No evidence of fracture, dislocation, or joint effusion. No evidence
of arthropathy or other focal bone abnormality. Soft tissues are
unremarkable.
IMPRESSION: Negative.

## 2020-08-11 MED ORDER — FLUTICASONE PROPIONATE 50 MCG/ACT NA SUSP: 1.0000 | Freq: Every day | NASAL | 3 refills | Status: DC

## 2020-08-11 MED ORDER — HYDROCODONE-HOMATROPINE 5-1.5 MG/5ML PO SYRP: 5.0000 mL | ORAL_SOLUTION | Freq: Four times a day (QID) | ORAL | 0 refills | Status: DC | PRN

## 2020-08-11 NOTE — Progress Notes (Signed)
Established Patient Office Visit  Subjective:  Patient ID: Hayley Fox, female    DOB: [birth year]  Age: 65 y.o. MRN: [id number]  CC:  Chief Complaint  Patient presents with   Cough    Patient complains of cough x1 month, clear sputum present, tried Benzonatate, and Delsym, with no relief.  Last COVID test was negative x1 month ago.    HPI Hayley Fox presents for persistent cough.  She states she was diagnosed with COVID about a month ago.  She never had any fever.  She felt poorly for about 3 days but then felt back to baseline with exception of dry cough.  Occasionally productive of thick mucus.  No dyspnea.  She has cough "spells "this any worse at night.  She tried Delsym and Best boy without relief.  Never smoked.  No postnasal drip.  No GERD.  No ACE inhibitor use.  She has had some wheezing in the past but none currently.  No recent appetite or weight changes.  No hemoptysis.  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   Qualifier: Diagnosis of  By: Sherren Mocha MD, Jory Ee     Past Surgical History:  Procedure Laterality Date   CESAREAN SECTION     COLONOSCOPY  12/2016   negative screening    Family History  Problem Relation Age of Onset   Diabetes Father    Hypertension Father    Hypertension Mother    Alcohol abuse Mother    Cirrhosis Mother    Diabetes Brother    High blood pressure Brother    High Cholesterol Brother    Colon cancer Neg Hx     Social History   Socioeconomic History   Marital status: Single    Spouse name: Not on file   Number of children: Not on file   Years of education: Not on file   Highest education level: Not on file  Occupational History   Not on file  Tobacco Use    Smoking status: Former Smoker    Years: 1.00    Types: Cigarettes   Smokeless tobacco: Never Used  Scientific laboratory technician Use: Never used  Substance and Sexual Activity   Alcohol use: Yes    Comment: socially-once a month per pt.   Drug use: No   Sexual activity: Not Currently    Birth control/protection: None  Other Topics Concern   Not on file  Social History Narrative   Work or School: live in caregiver      Home Situation: lives with client several days per week      Spiritual Beliefs: catholic      Lifestyle: exercises 2 days per week; diet is pretty healthy      Social Determinants of Radio broadcast assistant Strain: Not on file  Food Insecurity: Not on file  Transportation Needs: Not on file  Physical Activity: Not on file  Stress: Not on file  Social Connections: Not on file  Intimate Partner Violence: Not on file    Outpatient Medications Prior to Visit  Medication Sig Dispense Refill   albuterol (PROAIR HFA) 108 (90 Base) MCG/ACT inhaler Inhale 2 puffs into the lungs every 6 (six) hours as needed for  wheezing or shortness of breath. 1 each 0   amLODipine (NORVASC) 10 MG tablet TAKE 1 TABLET BY MOUTH ONCE DAILY 90 tablet 0   benzonatate (TESSALON) 100 MG capsule TAKE 1 CAPSULE BY MOUTH THREE TIMES A DAY AS NEEDED 20 capsule 0   Continuous Blood Gluc Receiver (FREESTYLE LIBRE 14 DAY READER) DEVI USE TO TEST BLOOD SUGAR 1 each 3   Continuous Blood Gluc Sensor (FREESTYLE LIBRE 14 DAY SENSOR) MISC 1 Device by Does not apply route as directed. 4 each 6   cyclobenzaprine (FLEXERIL) 10 MG tablet Take 1 tablet (10 mg total) by mouth 3 (three) times daily as needed for muscle spasms. 60 tablet 0   doxycycline (VIBRA-TABS) 100 MG tablet Take 1 tablet (100 mg total) by mouth 2 (two) times daily. 14 tablet 0   empagliflozin (JARDIANCE) 10 MG TABS tablet Take 1 tablet (10 mg total) by mouth daily. 90 tablet 0   glipiZIDE (GLUCOTROL) 5 MG tablet TAKE 2 TABLETS  BY MOUTH DAILY BEFORE BREAKFAST AND 1 TABLET WITH SUPPER 270 tablet 1   losartan (COZAAR) 50 MG tablet TAKE 1 TABLET BY MOUTH EVERY DAY 90 tablet 1   metFORMIN (GLUCOPHAGE-XR) 500 MG 24 hr tablet TAKE 1 TABLET(500 MG) BY MOUTH TWICE DAILY WITH A MEAL 180 tablet 1   Multiple Vitamins-Minerals (ONE-A-DAY 50 PLUS PO) Take by mouth.     rosuvastatin (CRESTOR) 10 MG tablet TAKE 1 TABLET BY MOUTH EVERY DAY 90 tablet 1   fluticasone (FLONASE) 50 MCG/ACT nasal spray Place into both nostrils daily.     Facility-Administered Medications Prior to Visit  Medication Dose Route Frequency Provider Last Rate Last Admin   0.9 %  sodium chloride infusion  500 mL Intravenous Continuous Gatha Mayer, MD        No Known Allergies  ROS Review of Systems  Constitutional: Negative for appetite change, chills, fever and unexpected weight change.  Respiratory: Positive for cough. Negative for shortness of breath and wheezing.   Cardiovascular: Negative for chest pain.      Objective:    Physical Exam Vitals reviewed.  Constitutional:      Appearance: Normal appearance.  Cardiovascular:     Rate and Rhythm: Normal rate and regular rhythm.  Pulmonary:     Effort: Pulmonary effort is normal.     Breath sounds: Normal breath sounds. No wheezing or rales.  Neurological:     Mental Status: She is alert.     BP 120/60 (BP Location: Left Arm, Patient Position: Sitting, Cuff Size: Large)    Pulse (!) 103    Temp 98.3 F (36.8 C) (Oral)    Ht 5\' 4"  (1.626 m)    Wt 169 lb 14.4 oz (77.1 kg)    LMP 07/29/2011    SpO2 97%    BMI 29.16 kg/m  Wt Readings from Last 3 Encounters:  08/11/20 169 lb 14.4 oz (77.1 kg)  04/10/20 169 lb 9.6 oz (76.9 kg)  01/14/20 171 lb 3.2 oz (77.7 kg)     Health Maintenance Due  Topic Date Due   COVID-19 Vaccine (3 - Booster for Pfizer series) 03/25/2020    There are no preventive care reminders to display for this patient.  Lab Results  Component Value Date   TSH  2.21 09/27/2019   Lab Results  Component Value Date   WBC 5.2 09/27/2019   HGB 11.8 (L) 09/27/2019   HCT 36.0 09/27/2019   MCV 79.1 09/27/2019   PLT 264.0 09/27/2019  Lab Results  Component Value Date   NA 138 09/27/2019   K 4.4 09/27/2019   CO2 28 09/27/2019   GLUCOSE 259 (H) 09/27/2019   BUN 15 09/27/2019   CREATININE 0.70 09/27/2019   BILITOT 0.6 09/27/2019   ALKPHOS 120 (H) 09/27/2019   AST 16 09/27/2019   ALT 23 09/27/2019   PROT 7.3 09/27/2019   ALBUMIN 4.7 09/27/2019   CALCIUM 9.3 09/27/2019   ANIONGAP 10 10/02/2015   GFR 101.97 09/27/2019   Lab Results  Component Value Date   CHOL 158 09/27/2019   Lab Results  Component Value Date   HDL 54.80 09/27/2019   Lab Results  Component Value Date   LDLCALC 81 09/27/2019   Lab Results  Component Value Date   TRIG 113.0 09/27/2019   Lab Results  Component Value Date   CHOLHDL 3 09/27/2019   Lab Results  Component Value Date   HGBA1C 6.8 (A) 04/10/2020      Assessment & Plan:   Cough.  Patient had reported Covid back over a month ago.  She is in no respiratory distress.  Unremarkable exam.  Suspect this is a lingering cough related to her Covid.  She does not have any ACE inhibitor use, GERD symptoms, postnasal drip, or obvious wheezing.  O2 sats are stable at 97%  -We discussed trial of Hycodan cough syrup 1 teaspoon nightly for nighttime use.  She is aware this may be sedating.  If cough not resolving next couple weeks recommend follow-up for chest x-ray -Refilled her Flonase  Meds ordered this encounter  Medications   HYDROcodone-homatropine (HYCODAN) 5-1.5 MG/5ML syrup    Sig: Take 5 mLs by mouth every 6 (six) hours as needed for up to 10 days.    Dispense:  120 mL    Refill:  0   fluticasone (FLONASE) 50 MCG/ACT nasal spray    Sig: Place 1 spray into both nostrils daily.    Dispense:  16 g    Refill:  3    Follow-up: No follow-ups on file.    Carolann Littler, MD

## 2020-08-11 NOTE — Patient Instructions (Signed)

## 2020-08-12 ENCOUNTER — Other Ambulatory Visit: Payer: Self-pay | Admitting: Family Medicine

## 2020-08-12 MED ORDER — HYDROCODONE-HOMATROPINE 5-1.5 MG/5ML PO SYRP: 5.0000 mL | ORAL_SOLUTION | Freq: Four times a day (QID) | ORAL | 0 refills | Status: AC | PRN

## 2020-08-12 NOTE — Telephone Encounter (Signed)
Prescription resent

## 2020-08-12 NOTE — Telephone Encounter (Signed)
Pt pharmacy do not have this medication in stock HYDROcodone-Homatropine 5-1.5 MG/5ML 5 mLs Oral Every  She would like this called into [facility] at [address], [place]  [phone] Pt saw Dr Elease Hashimoto he prescribe the medication

## 2020-10-08 ENCOUNTER — Other Ambulatory Visit: Payer: Self-pay

## 2020-10-09 ENCOUNTER — Other Ambulatory Visit (INDEPENDENT_AMBULATORY_CARE_PROVIDER_SITE_OTHER): Payer: 59

## 2020-10-09 DIAGNOSIS — E785 Hyperlipidemia, unspecified: Secondary | ICD-10-CM | POA: Diagnosis not present

## 2020-10-09 DIAGNOSIS — I10 Essential (primary) hypertension: Secondary | ICD-10-CM

## 2020-10-09 DIAGNOSIS — E1165 Type 2 diabetes mellitus with hyperglycemia: Secondary | ICD-10-CM | POA: Diagnosis not present

## 2020-10-09 LAB — CBC WITH DIFFERENTIAL/PLATELET
Basophils Absolute: 0.1 10*3/uL (ref 0.0–0.1)
Basophils Relative: 2.7 % (ref 0.0–3.0)
Eosinophils Absolute: 0.2 10*3/uL (ref 0.0–0.7)
Eosinophils Relative: 3.6 % (ref 0.0–5.0)
HCT: 38 % (ref 36.0–46.0)
Hemoglobin: 12.3 g/dL (ref 12.0–15.0)
Lymphocytes Relative: 35.1 % (ref 12.0–46.0)
Lymphs Abs: 1.5 10*3/uL (ref 0.7–4.0)
MCHC: 32.3 g/dL (ref 30.0–36.0)
MCV: 78.2 fl (ref 78.0–100.0)
Monocytes Absolute: 0.3 10*3/uL (ref 0.1–1.0)
Monocytes Relative: 7.5 % (ref 3.0–12.0)
Neutro Abs: 2.2 10*3/uL (ref 1.4–7.7)
Neutrophils Relative %: 51.1 % (ref 43.0–77.0)
Platelets: 259 10*3/uL (ref 150.0–400.0)
RBC: 4.86 Mil/uL (ref 3.87–5.11)
RDW: 16.2 % — ABNORMAL HIGH (ref 11.5–15.5)
WBC: 4.3 10*3/uL (ref 4.0–10.5)

## 2020-10-09 LAB — HEMOGLOBIN A1C: Hgb A1c MFr Bld: 7.5 % — ABNORMAL HIGH (ref 4.6–6.5)

## 2020-10-09 LAB — MICROALBUMIN / CREATININE URINE RATIO
Creatinine,U: 78.6 mg/dL
Microalb Creat Ratio: 1.9 mg/g (ref 0.0–30.0)
Microalb, Ur: 1.5 mg/dL (ref 0.0–1.9)

## 2020-10-09 LAB — LIPID PANEL
Cholesterol: 147 mg/dL (ref 0–200)
HDL: 56.5 mg/dL (ref >39.00)
LDL Cholesterol: 73 mg/dL (ref 0–99)
NonHDL: 90.1
Total CHOL/HDL Ratio: 3
Triglycerides: 86 mg/dL (ref 0.0–149.0)
VLDL: 17.2 mg/dL (ref 0.0–40.0)

## 2020-10-09 LAB — COMPREHENSIVE METABOLIC PANEL
ALT: 20 U/L (ref 0–35)
AST: 17 U/L (ref 0–37)
Albumin: 4.3 g/dL (ref 3.5–5.2)
Alkaline Phosphatase: 83 U/L (ref 39–117)
BUN: 19 mg/dL (ref 6–23)
CO2: 28 mEq/L (ref 19–32)
Calcium: 9.1 mg/dL (ref 8.4–10.5)
Chloride: 106 mEq/L (ref 96–112)
Creatinine, Ser: 0.75 mg/dL (ref 0.40–1.20)
GFR: 83.83 mL/min (ref >60.00)
Glucose, Bld: 112 mg/dL — ABNORMAL HIGH (ref 70–99)
Potassium: 4.3 mEq/L (ref 3.5–5.1)
Sodium: 141 mEq/L (ref 135–145)
Total Bilirubin: 0.5 mg/dL (ref 0.2–1.2)
Total Protein: 7 g/dL (ref 6.0–8.3)

## 2020-10-09 NOTE — Addendum Note (Signed)
Addended by: Tessie Fass D on: 10/09/2020 08:03 AM   Modules accepted: Orders

## 2020-10-09 NOTE — Addendum Note (Signed)
Addended by: Janann Colonel on: 10/09/2020 08:23 AM   Modules accepted: Orders

## 2020-10-16 ENCOUNTER — Encounter: Payer: Self-pay | Admitting: Family Medicine

## 2020-10-16 ENCOUNTER — Other Ambulatory Visit: Payer: Self-pay

## 2020-10-16 ENCOUNTER — Ambulatory Visit (INDEPENDENT_AMBULATORY_CARE_PROVIDER_SITE_OTHER): Payer: 59 | Admitting: Family Medicine

## 2020-10-16 VITALS — BP 108/80 | HR 83 | Temp 98.8°F | Ht 64.0 in | Wt 175.9 lb

## 2020-10-16 DIAGNOSIS — E119 Type 2 diabetes mellitus without complications: Secondary | ICD-10-CM

## 2020-10-16 DIAGNOSIS — E1165 Type 2 diabetes mellitus with hyperglycemia: Secondary | ICD-10-CM | POA: Diagnosis not present

## 2020-10-16 DIAGNOSIS — E785 Hyperlipidemia, unspecified: Secondary | ICD-10-CM | POA: Diagnosis not present

## 2020-10-16 DIAGNOSIS — B373 Candidiasis of vulva and vagina: Secondary | ICD-10-CM

## 2020-10-16 DIAGNOSIS — B3731 Acute candidiasis of vulva and vagina: Secondary | ICD-10-CM

## 2020-10-16 DIAGNOSIS — I10 Essential (primary) hypertension: Secondary | ICD-10-CM

## 2020-10-16 MED ORDER — LOSARTAN POTASSIUM 50 MG PO TABS: 1.0000 | ORAL_TABLET | Freq: Every day | ORAL | 1 refills | Status: DC

## 2020-10-16 MED ORDER — FLUCONAZOLE 150 MG PO TABS: 150.0000 mg | ORAL_TABLET | Freq: Once | ORAL | 0 refills | Status: AC

## 2020-10-16 MED ORDER — GLIPIZIDE 5 MG PO TABS
ORAL_TABLET | ORAL | 1 refills | Status: DC
Start: 2020-10-16 — End: 2021-05-12

## 2020-10-16 MED ORDER — EMPAGLIFLOZIN 10 MG PO TABS: 10.0000 mg | ORAL_TABLET | Freq: Every day | ORAL | 0 refills | Status: DC

## 2020-10-16 MED ORDER — ROSUVASTATIN CALCIUM 10 MG PO TABS: 10.0000 mg | ORAL_TABLET | Freq: Every day | ORAL | 1 refills | Status: DC

## 2020-10-16 NOTE — Patient Instructions (Signed)
If your sugars are running low (and you are eating healthy) it would be ok for you to decrease to half tablet of the glipizide twice daily or if regularly running low just stop this medication and let me know.

## 2020-10-16 NOTE — Progress Notes (Signed)
Hayley Fox DOB: 11-Oct-1955 Encounter date: 10/16/2020  This is a 65 y.o. female who presents with Chief Complaint  Patient presents with  . Follow-up  . Vaginal Itching    X2 weeks, denies discharge or sexual activity, tried Benadryl cream with some relief  . Constipation    X2 weeks    History of present illness: Thinks vaginal itching - secondary to jardiance/sugars elevating. She blames "addiction to food". She has not had issue with yeast infections on jardiance before. She had cut out carbs when sugars were doing well before. Put back foods that she shouldn't be eating - fries, potato salad.   Has been more constipated. Has been taking benefiber, but BM is harder balls.   DMII: she doesn't keep on continuous monitor all the time. Noticed that when she is following healthier diet her sugar can drop very low - like into the 50's. When she is really being good about eating this can happen twice/week.   HTN: has been well controlled on losartan 50mg  daily, amlodipine 10mg  daily HL: tolerates crestor 10mg  daily; lipids well controlled.   No Known Allergies Current Meds  Medication Sig  . albuterol (PROAIR HFA) 108 (90 Base) MCG/ACT inhaler Inhale 2 puffs into the lungs every 6 (six) hours as needed for wheezing or shortness of breath.  Marland Kitchen amLODipine (NORVASC) 10 MG tablet TAKE 1 TABLET BY MOUTH EVERY DAY  . Continuous Blood Gluc Receiver (FREESTYLE LIBRE 14 DAY READER) DEVI USE TO TEST BLOOD SUGAR  . Continuous Blood Gluc Sensor (FREESTYLE LIBRE 14 DAY SENSOR) MISC 1 Device by Does not apply route as directed.  . empagliflozin (JARDIANCE) 10 MG TABS tablet Take 1 tablet (10 mg total) by mouth daily.  . fluticasone (FLONASE) 50 MCG/ACT nasal spray Place 1 spray into both nostrils daily.  Marland Kitchen glipiZIDE (GLUCOTROL) 5 MG tablet TAKE 2 TABLETS BY MOUTH DAILY BEFORE BREAKFAST AND 1 TABLET WITH SUPPER  . losartan (COZAAR) 50 MG tablet TAKE 1 TABLET BY MOUTH EVERY DAY  . metFORMIN  (GLUCOPHAGE-XR) 500 MG 24 hr tablet TAKE 1 TABLET(500 MG) BY MOUTH TWICE DAILY WITH A MEAL  . OVER THE COUNTER MEDICATION Benefactor supplement  . rosuvastatin (CRESTOR) 10 MG tablet TAKE 1 TABLET BY MOUTH EVERY DAY  . [DISCONTINUED] benzonatate (TESSALON) 100 MG capsule TAKE 1 CAPSULE BY MOUTH THREE TIMES A DAY AS NEEDED  . [DISCONTINUED] cyclobenzaprine (FLEXERIL) 10 MG tablet Take 1 tablet (10 mg total) by mouth 3 (three) times daily as needed for muscle spasms.  . [DISCONTINUED] doxycycline (VIBRA-TABS) 100 MG tablet Take 1 tablet (100 mg total) by mouth 2 (two) times daily.  . [DISCONTINUED] Multiple Vitamins-Minerals (ONE-A-DAY 50 PLUS PO) Take by mouth.   Current Facility-Administered Medications for the 10/16/20 encounter (Office Visit) with Caren Macadam, MD  Medication  . 0.9 %  sodium chloride infusion    Review of Systems  Constitutional: Negative for chills, fatigue and fever.  Respiratory: Negative for cough, chest tightness, shortness of breath and wheezing.   Cardiovascular: Negative for chest pain, palpitations and leg swelling.    Objective:  BP 108/80 (BP Location: Left Arm, Patient Position: Sitting, Cuff Size: Large)   Pulse 83   Temp 98.8 F (37.1 C) (Oral)   Ht 5\' 4"  (1.626 m)   Wt 175 lb 14.4 oz (79.8 kg)   LMP 07/29/2011   SpO2 97%   BMI 30.19 kg/m   Weight: 175 lb 14.4 oz (79.8 kg)   BP Readings from Last 3  Encounters:  10/16/20 108/80  08/11/20 120/60  04/10/20 120/62   Wt Readings from Last 3 Encounters:  10/16/20 175 lb 14.4 oz (79.8 kg)  08/11/20 169 lb 14.4 oz (77.1 kg)  04/10/20 169 lb 9.6 oz (76.9 kg)    Physical Exam Constitutional:      General: She is not in acute distress.    Appearance: She is well-developed.  HENT:     Head: Normocephalic and atraumatic.  Cardiovascular:     Rate and Rhythm: Normal rate and regular rhythm.     Heart sounds: Murmur heard.   Systolic (right sternal border) murmur is present with a grade of  1/6.   Pulmonary:     Effort: Pulmonary effort is normal.     Breath sounds: Normal breath sounds.  Abdominal:     General: Bowel sounds are normal. There is no distension.     Palpations: Abdomen is soft.     Tenderness: There is no abdominal tenderness. There is no guarding.  Skin:    General: Skin is warm and dry.     Comments: Sensory exam of the foot is normal, tested with the monofilament. Good pulses, no lesions or ulcers, good peripheral pulses.  Psychiatric:        Judgment: Judgment normal.     Assessment/Plan   1. Uncontrolled type 2 diabetes mellitus with hyperglycemia (HCC) Discussed limiting glipizide if hypoglycemic. She will watch and decrease as directed. Our goal would be to get her off the glipizide entirely if possible and just maintain on the metformin and jardiance.  2. Essential hypertension Well controlled. contiue with amlodipine 10mg , losartan 50mg   3. Yeast vaginitis Diflucan. Let me know if not improved with this.  4. Hyperlipidemia, unspecified hyperlipidemia type crestor 10mg  daily; well controlled.   Return in about 6 months (around 04/17/2021) for physical exam but also lab visit in 3 months for A1C.       Micheline Rough, MD

## 2020-11-07 ENCOUNTER — Other Ambulatory Visit: Payer: Self-pay | Admitting: Family Medicine

## 2020-11-26 ENCOUNTER — Other Ambulatory Visit: Payer: Self-pay | Admitting: Family Medicine

## 2020-12-18 ENCOUNTER — Telehealth: Payer: Self-pay | Admitting: Family Medicine

## 2020-12-18 NOTE — Telephone Encounter (Signed)
Pt is calling in stating that she still has a itch in her private area and stated that Dr. Ethlyn Gallery had givne her pills before, but now she would like to have some cream she has been using OTC benadryl cream and not sure if it should be used for that area.  Pt declined to make an appointment and wanted a msg sent back.  Pharm:  [facility] on [address] in [place], [place]

## 2020-12-23 ENCOUNTER — Other Ambulatory Visit: Payer: Self-pay | Admitting: Family Medicine

## 2020-12-23 MED ORDER — MONISTAT 7 COMBO PACK APP 100 & 2 MG-% (9GM) VA KIT: 1.0000 | PACK | Freq: Every evening | VAGINAL | 0 refills | Status: DC

## 2020-12-23 NOTE — Telephone Encounter (Signed)
Did she get improvement after diflucan we gave before? I can send something in but I need to know if she had improvement with this to determine best treatment. Sx may be related to jardiance as well; so we may need to review use of this. And we should do vaginal exam if sx don't resolve with treatment. I will send something in pending response.

## 2020-12-23 NOTE — Telephone Encounter (Signed)
Patient informed of the message below.  Appt scheduled for 7/29 and the pt agreed to call back to cancel if needed.

## 2020-12-23 NOTE — Telephone Encounter (Signed)
Spoke with the pt and informed her of the message below.  Patient stated Diflucan did not help her and she complains of recurrent itching only,denies any vaginal discharge or other symptoms.  Message sent to PCP.

## 2020-12-23 NOTE — Telephone Encounter (Signed)
[facility], but suggest in office exam since itching can occur with other conditions besides yeast infection. I sent in 7 day treatment. She could get visit set out in 1-2 weeks and cancel if symptoms resolve. Should note improvement in 2-3 days after starting cream and if not should have exam.

## 2021-01-10 ENCOUNTER — Other Ambulatory Visit: Payer: Self-pay | Admitting: Family Medicine

## 2021-01-14 ENCOUNTER — Other Ambulatory Visit: Payer: Self-pay

## 2021-01-15 ENCOUNTER — Encounter: Payer: Self-pay | Admitting: Family Medicine

## 2021-01-15 ENCOUNTER — Ambulatory Visit (INDEPENDENT_AMBULATORY_CARE_PROVIDER_SITE_OTHER): Payer: Medicare (Managed Care) | Admitting: Family Medicine

## 2021-01-15 ENCOUNTER — Other Ambulatory Visit (INDEPENDENT_AMBULATORY_CARE_PROVIDER_SITE_OTHER): Payer: Medicare (Managed Care)

## 2021-01-15 VITALS — BP 132/70 | HR 90 | Temp 98.3°F | Ht 64.0 in | Wt 176.9 lb

## 2021-01-15 DIAGNOSIS — E1165 Type 2 diabetes mellitus with hyperglycemia: Secondary | ICD-10-CM

## 2021-01-15 DIAGNOSIS — J309 Allergic rhinitis, unspecified: Secondary | ICD-10-CM

## 2021-01-15 DIAGNOSIS — M5441 Lumbago with sciatica, right side: Secondary | ICD-10-CM | POA: Diagnosis not present

## 2021-01-15 DIAGNOSIS — I10 Essential (primary) hypertension: Secondary | ICD-10-CM | POA: Diagnosis not present

## 2021-01-15 DIAGNOSIS — R0781 Pleurodynia: Secondary | ICD-10-CM | POA: Diagnosis not present

## 2021-01-15 LAB — HEMOGLOBIN A1C: Hgb A1c MFr Bld: 8 % — ABNORMAL HIGH (ref 4.6–6.5)

## 2021-01-15 MED ORDER — METFORMIN HCL ER 500 MG PO TB24: ORAL_TABLET | ORAL | 1 refills | Status: DC

## 2021-01-15 MED ORDER — FLUTICASONE PROPIONATE 50 MCG/ACT NA SUSP: 1.0000 | Freq: Every day | NASAL | 3 refills | Status: DC

## 2021-01-15 MED ORDER — CLOTRIMAZOLE 1 % EX CREA: 1.0000 "application " | TOPICAL_CREAM | Freq: Two times a day (BID) | CUTANEOUS | 0 refills | Status: DC

## 2021-01-15 NOTE — Progress Notes (Signed)
Anelle Selke DOB: 1956/01/17 Encounter date: 01/15/2021  This is a 65 y.o. female who presents with Chief Complaint  Patient presents with   Vaginal Itching    Patient states the vaginal itching has resolved   Back Pain    History of present illness: Last visit was in April for vaginal itching. At that time thought related to jardiance/elevated blood sugars. She had a normal pap done by me on 10/25/19. We treated with diflucan after April visit. All of these sx have resolved.  Sciatica is acting up. Having pain right lower back - noting with movement; turning in bed, and has numbness right thigh. Standing/walking aggravate it. Thinks weight gain triggered it. Has tried tylenol, but this hasn't worked. Hasn't tried heat/ice. Does try to stretch which has worked in past; not as helpful this time. Doesn't remember if/when she had back pain imaging. No hip pain.   Sugars have been doing ok; although has been awhile since wearing glucometer. No symptomatic low sugars.    No Known Allergies Current Meds  Medication Sig   albuterol (PROAIR HFA) 108 (90 Base) MCG/ACT inhaler Inhale 2 puffs into the lungs every 6 (six) hours as needed for wheezing or shortness of breath.   amLODipine (NORVASC) 10 MG tablet TAKE 1 TABLET BY MOUTH EVERY DAY   Continuous Blood Gluc Receiver (FREESTYLE LIBRE 14 DAY READER) DEVI USE TO TEST BLOOD SUGAR   Continuous Blood Gluc Sensor (FREESTYLE LIBRE 14 DAY SENSOR) MISC 1 Device by Does not apply route as directed.   glipiZIDE (GLUCOTROL) 5 MG tablet Take 0.5 tablet PO before breakfast and 0.5 tablet with supper   JARDIANCE 10 MG TABS tablet TAKE 1 TABLET BY MOUTH EVERY DAY   losartan (COZAAR) 50 MG tablet Take 1 tablet (50 mg total) by mouth daily.   OVER THE COUNTER MEDICATION Benefactor supplement   rosuvastatin (CRESTOR) 10 MG tablet Take 1 tablet (10 mg total) by mouth daily.   [DISCONTINUED] fluticasone (FLONASE) 50 MCG/ACT nasal spray Place 1 spray into both  nostrils daily.   [DISCONTINUED] metFORMIN (GLUCOPHAGE-XR) 500 MG 24 hr tablet TAKE 1 TABLET(500 MG) BY MOUTH TWICE DAILY WITH A MEAL   Current Facility-Administered Medications for the 01/15/21 encounter (Office Visit) with Caren Macadam, MD  Medication   0.9 %  sodium chloride infusion    Review of Systems  Constitutional:  Negative for chills, fatigue and fever.  Respiratory:  Negative for cough, chest tightness, shortness of breath and wheezing.   Cardiovascular:  Negative for chest pain, palpitations and leg swelling.  Musculoskeletal:  Positive for back pain.  Neurological:  Positive for numbness.   Objective:  BP 132/70 (BP Location: Left Arm, Patient Position: Sitting, Cuff Size: Normal)   Pulse 90   Temp 98.3 F (36.8 C) (Oral)   Ht '5\' 4"'$  (1.626 m)   Wt 176 lb 14.4 oz (80.2 kg)   LMP 07/29/2011   BMI 30.36 kg/m   Weight: 176 lb 14.4 oz (80.2 kg)   BP Readings from Last 3 Encounters:  01/15/21 132/70  10/16/20 108/80  08/11/20 120/60   Wt Readings from Last 3 Encounters:  01/15/21 176 lb 14.4 oz (80.2 kg)  10/16/20 175 lb 14.4 oz (79.8 kg)  08/11/20 169 lb 14.4 oz (77.1 kg)    Physical Exam Constitutional:      General: She is not in acute distress.    Appearance: She is well-developed.  Cardiovascular:     Rate and Rhythm: Normal rate and regular  rhythm.     Heart sounds: Normal heart sounds. No murmur heard.   No friction rub.  Pulmonary:     Effort: Pulmonary effort is normal. No respiratory distress.     Breath sounds: Normal breath sounds. No wheezing or rales.  Musculoskeletal:     Right lower leg: No edema.     Left lower leg: No edema.     Comments: Right lower rib tenderness to touch bottom three ribs axillary, posterior. She has some improvement in pain with extension; slight worsening/tightening across lower back with flexion. Mild discomfort with right twist.   Negative straight leg raise. There is some mild restriction with external  rotation right hip, but no significant pain.     Neurological:     Mental Status: She is alert and oriented to person, place, and time.     Deep Tendon Reflexes:     Reflex Scores:      Patellar reflexes are 2+ on the right side and 2+ on the left side.      Achilles reflexes are 2+ on the right side and 2+ on the left side. Psychiatric:        Behavior: Behavior normal.    Assessment/Plan  1. Low back pain with right-sided sciatica, unspecified back pain laterality, unspecified chronicity She prefers to follow up with sports medicine for evaluation. We discussed consideration for imaging and PT but she will complete eval with them first.  - Ambulatory referral to Sports Medicine  2. Rib pain on right side Mild tenderness to palpation. This seems separate from back pain.   3. Essential hypertension Stable. Continue with amlodipine '10mg'$  daily, losartan '50mg'$  daily.   4. Allergic rhinitis, unspecified seasonality, unspecified trigger Has been well controlled with flonase. This was refilled today.  Return for pending blood sugar results.   Micheline Rough, MD

## 2021-01-15 NOTE — Addendum Note (Signed)
Addended by: Agnes Lawrence on: 01/15/2021 04:37 PM   Modules accepted: Orders

## 2021-01-20 ENCOUNTER — Other Ambulatory Visit: Payer: Self-pay

## 2021-01-20 ENCOUNTER — Ambulatory Visit (INDEPENDENT_AMBULATORY_CARE_PROVIDER_SITE_OTHER): Payer: Medicare (Managed Care)

## 2021-01-20 ENCOUNTER — Ambulatory Visit (INDEPENDENT_AMBULATORY_CARE_PROVIDER_SITE_OTHER): Payer: Medicare (Managed Care) | Admitting: Family Medicine

## 2021-01-20 ENCOUNTER — Encounter: Payer: Self-pay | Admitting: Family Medicine

## 2021-01-20 VITALS — BP 110/84 | HR 107 | Ht 64.0 in | Wt 168.0 lb

## 2021-01-20 DIAGNOSIS — M533 Sacrococcygeal disorders, not elsewhere classified: Secondary | ICD-10-CM

## 2021-01-20 DIAGNOSIS — M25552 Pain in left hip: Secondary | ICD-10-CM | POA: Diagnosis not present

## 2021-01-20 DIAGNOSIS — M25551 Pain in right hip: Secondary | ICD-10-CM

## 2021-01-20 DIAGNOSIS — M545 Low back pain, unspecified: Secondary | ICD-10-CM

## 2021-01-20 MED ORDER — GABAPENTIN 100 MG PO CAPS: 200.0000 mg | ORAL_CAPSULE | Freq: Every day | ORAL | 0 refills | Status: DC

## 2021-01-20 NOTE — Assessment & Plan Note (Addendum)
Pain in the left sacroiliac joint noted.  Discussed possible antibiotics, discussed that I consider which wants to avoid.  Increase activity slowly.  Patient given exercises.  We are awaiting x-rays.  Follow-up with me again in 6 to 8 weeks.  Started on gabapentin as well. Pain and some relief.  If worsening pain or no improvement at follow-up will consider muscle relaxers as well as formal physical therapy or advanced imaging.

## 2021-01-20 NOTE — Patient Instructions (Signed)
Xrays today Gabapentin '200mg'$  at night Exercises 3x a week Ice 20 min 2x a day Pennsaid 2x a day See me in 5-6 weeks

## 2021-01-20 NOTE — Progress Notes (Signed)
[place] Phone: [phone] Subjective:   Hayley Fox, am serving as a scribe for Dr. Hulan Saas. This visit occurred during the SARS-CoV-2 public health emergency.  Safety protocols were in place, including screening questions prior to the visit, additional usage of staff PPE, and extensive cleaning of exam room while observing appropriate contact time as indicated for disinfecting solutions.   I'm seeing this patient by the request  of:  Koberlein, Steele Berg, MD  CC: Low back pain f  [id number]  Hayley Fox is a 65 y.o. female coming in with complaint of constant LBP. Patient states that she is having odd sensation like numbness in R quad and pain in SI joint pain. Was diagnosed with sciatica years ago but states that this feels different. Pain became worse 2 weeks ago. Does caregiving for work. Patient has tried biofreeze and Tylenol.       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   Qualifier: Diagnosis of  By: Sherren Mocha MD, Jory Ee    Past Surgical History:  Procedure Laterality Date   CESAREAN SECTION     COLONOSCOPY  12/2016   negative screening   Social History   Socioeconomic History   Marital status: Single    Spouse name: Not on file   Number of children: Not on file   Years of education: Not on file   Highest education level: Not on file  Occupational History   Not on file  Tobacco Use   Smoking status: Former    Years: 1.00    Types: Cigarettes   Smokeless tobacco: Never  Vaping Use   Vaping Use: Never used  Substance and Sexual Activity   Alcohol use: Yes    Comment: socially-once a month per pt.   Drug use: Fox   Sexual activity: Not  Currently    Birth control/protection: None  Other Topics Concern   Not on file  Social History Narrative   Work or School: live in caregiver      Home Situation: lives with client several days per week      Spiritual Beliefs: catholic      Lifestyle: exercises 2 days per week; diet is pretty healthy      Social Determinants of Radio broadcast assistant Strain: Not on file  Food Insecurity: Not on file  Transportation Needs: Not on file  Physical Activity: Not on file  Stress: Not on file  Social Connections: Not on file   Fox Known Allergies Family History  Problem Relation Age of Onset   Diabetes Father    Hypertension Father    Hypertension Mother    Alcohol abuse Mother    Cirrhosis Mother    Diabetes Brother    High blood pressure Brother    High Cholesterol Brother    Colon cancer Neg Hx     Current Outpatient Medications (Endocrine & Metabolic):    glipiZIDE (GLUCOTROL) 5 MG tablet, Take 0.5 tablet PO before breakfast and 0.5 tablet with supper   JARDIANCE 10 MG TABS tablet, TAKE 1 TABLET BY MOUTH EVERY DAY   metFORMIN (GLUCOPHAGE-XR) 500 MG 24 hr tablet, TAKE 1 TABLET(500 MG) BY MOUTH  TWICE DAILY WITH A MEAL   Current Outpatient Medications (Cardiovascular):    amLODipine (NORVASC) 10 MG tablet, TAKE 1 TABLET BY MOUTH EVERY DAY   losartan (COZAAR) 50 MG tablet, Take 1 tablet (50 mg total) by mouth daily.   rosuvastatin (CRESTOR) 10 MG tablet, Take 1 tablet (10 mg total) by mouth daily.   Current Outpatient Medications (Respiratory):    albuterol (PROAIR HFA) 108 (90 Base) MCG/ACT inhaler, Inhale 2 puffs into the lungs every 6 (six) hours as needed for wheezing or shortness of breath.   fluticasone (FLONASE) 50 MCG/ACT nasal spray, Place 1 spray into both nostrils daily.       Current Outpatient Medications (Other):    clotrimazole ([facility] CLOTRIMAZOLE) 1 % cream, Apply 1 application topically 2 (two) times daily.   Continuous Blood Gluc Receiver  (FREESTYLE LIBRE 14 DAY READER) DEVI, USE TO TEST BLOOD SUGAR   Continuous Blood Gluc Sensor (FREESTYLE LIBRE 14 DAY SENSOR) MISC, 1 Device by Does not apply route as directed.   gabapentin (NEURONTIN) 100 MG capsule, Take 2 capsules (200 mg total) by mouth at bedtime.   OVER THE COUNTER MEDICATION, Benefactor supplement  Current Facility-Administered Medications (Other):    0.9 %  sodium chloride infusion   Reviewed prior external information including notes and imaging from  primary care provider As well as notes that were available from care everywhere and other healthcare systems.  Past medical history, social, surgical and family history all reviewed in electronic medical record.  Fox pertanent information unless stated regarding to the chief complaint.   Review of Systems:  Fox headache, visual changes, nausea, vomiting, diarrhea, constipation, dizziness, abdominal pain, skin rash, fevers, chills, night sweats, weight loss, swollen lymph nodes, body aches, joint swelling, chest pain, shortness of breath, mood changes. POSITIVE muscle aches  Objective  Blood pressure 110/84, pulse (!) 107, height '5\' 4"'$  (1.626 m), weight 168 lb (76.2 kg), last menstrual period 07/29/2011, SpO2 97 %.   General: Fox apparent distress alert and oriented x3 mood and affect normal, dressed appropriately.  HEENT: Pupils equal, extraocular movements intact  Respiratory: Patient's speak in full sentences and does not appear short of breath  Cardiovascular: Fox lower extremity edema, non tender, Fox erythema  Gait normal with good balance and coordination.  MSK: Mild arthritic changes of multiple joints Back exam does have some loss of lordosis.  Difficulty going from a seated to standing position secondary to tightness of the back.  Patient does have tightness noted with FABER test.  Negative straight leg test.  Neurovascularly intact with 5 out of 5 strength.   [id number]; 15 additional minutes spent for Therapeutic  exercises as stated in above notes.  This included exercises focusing on stretching, strengthening, with significant focus on eccentric aspects.   Long term goals include an improvement in range of motion, strength, endurance as well as avoiding reinjury. Patient's frequency would include in 1-2 times a day, 3-5 times a week for a duration of 6-12 weeks. Low back exercises that included:  Pelvic tilt/bracing instruction to focus on control of the pelvic girdle and lower abdominal muscles  Glute strengthening exercises, focusing on proper firing of the glutes without engaging the low back muscles Proper stretching techniques for maximum relief for the hamstrings, hip flexors, low back and some rotation where tolerated  Proper technique shown and discussed handout in great detail with ATC.  All questions were discussed and answered.     Impression and Recommendations:     The  above documentation has been reviewed and is accurate and complete Lyndal Pulley, DO

## 2021-02-03 ENCOUNTER — Other Ambulatory Visit: Payer: Self-pay | Admitting: Family Medicine

## 2021-02-18 ENCOUNTER — Other Ambulatory Visit: Payer: Self-pay | Admitting: Family Medicine

## 2021-02-23 NOTE — Progress Notes (Deleted)
[place] Phone: [phone] Subjective:    I'm seeing this patient by the request  of:  Caren Macadam, MD  CC:   [id number]  01/20/2021 Pain in the left sacroiliac joint noted.  Discussed possible antibiotics, discussed that I consider which wants to avoid.  Increase activity slowly.  Patient given exercises.  We are awaiting x-rays.  Follow-up with me again in 6 to 8 weeks.  Started on gabapentin as well. Pain and some relief.  If worsening pain or no improvement at follow-up will consider muscle relaxers as well as formal physical therapy or advanced imaging.  Update 02/24/2021 Hayley Fox is a 65 y.o. female coming in with complaint of B hip and low back pain. 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   Qualifier: Diagnosis of  By: Sherren Mocha MD, Jory Ee    Past Surgical History:  Procedure Laterality Date   CESAREAN SECTION     COLONOSCOPY  12/2016   negative screening   Social History   Socioeconomic History   Marital status: Single    Spouse name: Not on file   Number of children: Not on file   Years of education: Not on file   Highest education level: Not on file  Occupational History   Not on file  Tobacco Use   Smoking status: Former    Years: 1.00    Types: Cigarettes   Smokeless tobacco: Never  Vaping Use   Vaping Use: Never used  Substance and Sexual Activity   Alcohol use: Yes    Comment: socially-once a month per pt.   Drug use: No   Sexual activity: Not Currently    Birth control/protection: None  Other Topics Concern   Not on file  Social History Narrative   Work or School: live in caregiver      Home Situation:  lives with client several days per week      Spiritual Beliefs: catholic      Lifestyle: exercises 2 days per week; diet is pretty healthy      Social Determinants of Radio broadcast assistant Strain: Not on file  Food Insecurity: Not on file  Transportation Needs: Not on file  Physical Activity: Not on file  Stress: Not on file  Social Connections: Not on file   No Known Allergies Family History  Problem Relation Age of Onset   Diabetes Father    Hypertension Father    Hypertension Mother    Alcohol abuse Mother    Cirrhosis Mother    Diabetes Brother    High blood pressure Brother    High Cholesterol Brother    Colon cancer Neg Hx     Current Outpatient Medications (Endocrine & Metabolic):    glipiZIDE (GLUCOTROL) 5 MG tablet, Take 0.5 tablet PO before breakfast and 0.5 tablet with supper   JARDIANCE 10 MG TABS tablet, TAKE 1 TABLET BY MOUTH EVERY DAY   metFORMIN (GLUCOPHAGE-XR) 500 MG 24 hr tablet, TAKE 1 TABLET(500 MG) BY MOUTH TWICE DAILY WITH A MEAL   Current Outpatient Medications (Cardiovascular):    amLODipine (NORVASC) 10 MG tablet, TAKE 1 TABLET  BY MOUTH EVERY DAY   losartan (COZAAR) 50 MG tablet, Take 1 tablet (50 mg total) by mouth daily.   rosuvastatin (CRESTOR) 10 MG tablet, Take 1 tablet (10 mg total) by mouth daily.   Current Outpatient Medications (Respiratory):    albuterol (VENTOLIN HFA) 108 (90 Base) MCG/ACT inhaler, TAKE 2 PUFFS BY MOUTH EVERY 6 HOURS AS NEEDED FOR WHEEZE OR SHORTNESS OF BREATH   fluticasone (FLONASE) 50 MCG/ACT nasal spray, Place 1 spray into both nostrils daily.       Current Outpatient Medications (Other):    clotrimazole ([facility] CLOTRIMAZOLE) 1 % cream, Apply 1 application topically 2 (two) times daily.   Continuous Blood Gluc Receiver (FREESTYLE LIBRE 14 DAY READER) DEVI, USE TO TEST BLOOD SUGAR   Continuous Blood Gluc Sensor (FREESTYLE LIBRE 14 DAY SENSOR) MISC, 1 Device by Does not apply route as directed.    gabapentin (NEURONTIN) 100 MG capsule, Take 2 capsules (200 mg total) by mouth at bedtime.   OVER THE COUNTER MEDICATION, Benefactor supplement  Current Facility-Administered Medications (Other):    0.9 %  sodium chloride infusion   Reviewed prior external information including notes and imaging from  primary care provider As well as notes that were available from care everywhere and other healthcare systems.  Past medical history, social, surgical and family history all reviewed in electronic medical record.  No pertanent information unless stated regarding to the chief complaint.   Review of Systems:  No headache, visual changes, nausea, vomiting, diarrhea, constipation, dizziness, abdominal pain, skin rash, fevers, chills, night sweats, weight loss, swollen lymph nodes, body aches, joint swelling, chest pain, shortness of breath, mood changes. POSITIVE muscle aches  Objective  Last menstrual period 07/29/2011.   General: No apparent distress alert and oriented x3 mood and affect normal, dressed appropriately.  HEENT: Pupils equal, extraocular movements intact  Respiratory: Patient's speak in full sentences and does not appear short of breath  Cardiovascular: No lower extremity edema, non tender, no erythema  Gait normal with good balance and coordination.  MSK:  Non tender with full range of motion and good stability and symmetric strength and tone of shoulders, elbows, wrist, hip, knee and ankles bilaterally.     Impression and Recommendations:     The above documentation has been reviewed and is accurate and complete Jacqualin Combes

## 2021-02-24 ENCOUNTER — Ambulatory Visit: Payer: Medicare (Managed Care) | Admitting: Family Medicine

## 2021-02-27 ENCOUNTER — Other Ambulatory Visit: Payer: Self-pay | Admitting: Family Medicine

## 2021-04-07 ENCOUNTER — Other Ambulatory Visit: Payer: Self-pay

## 2021-04-07 ENCOUNTER — Ambulatory Visit (INDEPENDENT_AMBULATORY_CARE_PROVIDER_SITE_OTHER): Payer: Medicare (Managed Care) | Admitting: Family Medicine

## 2021-04-07 ENCOUNTER — Encounter: Payer: Self-pay | Admitting: Family Medicine

## 2021-04-07 VITALS — BP 130/80 | HR 58 | Temp 98.7°F | Wt 180.0 lb

## 2021-04-07 DIAGNOSIS — R300 Dysuria: Secondary | ICD-10-CM

## 2021-04-07 LAB — POC URINALSYSI DIPSTICK (AUTOMATED)
Bilirubin, UA: NEGATIVE
Blood, UA: NEGATIVE
Glucose, UA: POSITIVE — AB
Ketones, UA: NEGATIVE
Leukocytes, UA: NEGATIVE
Nitrite, UA: NEGATIVE
Protein, UA: NEGATIVE
Spec Grav, UA: 1.015 (ref 1.010–1.025)
Urobilinogen, UA: 0.2 E.U./dL
pH, UA: 5.5 (ref 5.0–8.0)

## 2021-04-07 MED ORDER — FLUCONAZOLE 150 MG PO TABS: 150.0000 mg | ORAL_TABLET | Freq: Once | ORAL | 1 refills | Status: AC

## 2021-04-07 NOTE — Progress Notes (Signed)
   Subjective:    Patient ID: Hayley Fox, female    DOB: [birth year], 65 y.o.   MRN: [id number]  HPI Here for 2 weeks of itching in the vaginal area and slight burning with urination. No DC has been seen. No odor. No urinary urgency or frequency. No fever or nausea. Her diabetes has not been well controlled. Her last A1c in July was 8.0, and her glucoses have been averaging in the low 200s. Her UA today is clear.    Review of Systems  Constitutional: Negative.   Respiratory: Negative.    Cardiovascular: Negative.   Gastrointestinal: Negative.   Genitourinary:  Positive for dysuria. Negative for flank pain, frequency, hematuria and urgency.      Objective:   Physical Exam Constitutional:      Appearance: Normal appearance. She is not ill-appearing.  Cardiovascular:     Rate and Rhythm: Normal rate and regular rhythm.     Pulses: Normal pulses.     Heart sounds: Normal heart sounds.  Pulmonary:     Effort: Pulmonary effort is normal.     Breath sounds: Normal breath sounds.  Abdominal:     General: Abdomen is flat. Bowel sounds are normal. There is no distension.     Palpations: Abdomen is soft. There is no mass.     Tenderness: There is no abdominal tenderness. There is no right CVA tenderness, left CVA tenderness, guarding or rebound.     Hernia: No hernia is present.  Neurological:     Mental Status: She is alert.          Assessment & Plan:  This sounds more like a vaginal yeast infection than a UTI, but we will send the urine for a culture to be sure. We will treat her with Diflucan in the meantime.  Alysia Penna, MD

## 2021-04-09 LAB — URINE CULTURE
MICRO NUMBER:: 12524716
Result:: NO GROWTH
SPECIMEN QUALITY:: ADEQUATE

## 2021-04-19 ENCOUNTER — Other Ambulatory Visit: Payer: Self-pay | Admitting: Family Medicine

## 2021-04-19 ENCOUNTER — Other Ambulatory Visit (INDEPENDENT_AMBULATORY_CARE_PROVIDER_SITE_OTHER): Payer: Medicare (Managed Care)

## 2021-04-19 ENCOUNTER — Other Ambulatory Visit: Payer: Self-pay

## 2021-04-19 DIAGNOSIS — E1165 Type 2 diabetes mellitus with hyperglycemia: Secondary | ICD-10-CM

## 2021-04-19 LAB — HEMOGLOBIN A1C: Hgb A1c MFr Bld: 8.1 % — ABNORMAL HIGH (ref 4.6–6.5)

## 2021-04-28 MED ORDER — RYBELSUS 3 MG PO TABS: 3.0000 mg | ORAL_TABLET | Freq: Every day | ORAL | 0 refills | Status: DC

## 2021-05-04 ENCOUNTER — Other Ambulatory Visit: Payer: Self-pay | Admitting: Family Medicine

## 2021-05-04 DIAGNOSIS — E785 Hyperlipidemia, unspecified: Secondary | ICD-10-CM

## 2021-05-12 ENCOUNTER — Encounter: Payer: Self-pay | Admitting: Family Medicine

## 2021-05-12 ENCOUNTER — Telehealth: Payer: Self-pay | Admitting: *Deleted

## 2021-05-12 ENCOUNTER — Telehealth (INDEPENDENT_AMBULATORY_CARE_PROVIDER_SITE_OTHER): Payer: Medicare (Managed Care) | Admitting: Family Medicine

## 2021-05-12 DIAGNOSIS — E119 Type 2 diabetes mellitus without complications: Secondary | ICD-10-CM | POA: Diagnosis not present

## 2021-05-12 NOTE — Telephone Encounter (Signed)
Spoke with the patient and scheduled an appt on 08/16/2021.  Appt card attached to the sample and the patient stated she is on her way to pick this up.

## 2021-05-12 NOTE — Progress Notes (Signed)
Virtual Visit via Video Note  I connected with Hayley Fox on 05/12/21 at  1:00 PM EST by a video enabled telemedicine application and verified that I am speaking with the correct person using two identifiers.  Location patient: home Location provider: [place], [place] [address] Persons participating in the virtual visit: patient, provider  I discussed the limitations of evaluation and management by telemedicine and the availability of in person appointments. The patient expressed understanding and agreed to proceed.   Hayley Fox DOB: August 30, 1955 Encounter date: 05/12/2021  This is a 65 y.o. female who presents with Chief Complaint  Patient presents with   Medication Refill    History of present illness: In October she had A1C that was 8.0.   Ended up with car trouble so didn't get chance to pick up rybelsus. This visit was going to be a follow up after starting that medication.    No Known Allergies Current Meds  Medication Sig   albuterol (VENTOLIN HFA) 108 (90 Base) MCG/ACT inhaler TAKE 2 PUFFS BY MOUTH EVERY 6 HOURS AS NEEDED FOR WHEEZE OR SHORTNESS OF BREATH   amLODipine (NORVASC) 10 MG tablet TAKE 1 TABLET BY MOUTH EVERY DAY   clotrimazole (CVS CLOTRIMAZOLE) 1 % cream Apply 1 application topically 2 (two) times daily.   Continuous Blood Gluc Receiver (FREESTYLE LIBRE 14 DAY READER) DEVI USE TO TEST BLOOD SUGAR   Continuous Blood Gluc Sensor (FREESTYLE LIBRE 14 DAY SENSOR) MISC 1 Device by Does not apply route as directed.   fluticasone (FLONASE) 50 MCG/ACT nasal spray SPRAY 1 SPRAY INTO BOTH NOSTRILS DAILY.   gabapentin (NEURONTIN) 100 MG capsule TAKE 2 CAPSULES BY MOUTH AT BEDTIME.   JARDIANCE 10 MG TABS tablet TAKE 1 TABLET BY MOUTH EVERY DAY   losartan (COZAAR) 50 MG tablet TAKE 1 TABLET BY MOUTH EVERY DAY   metFORMIN (GLUCOPHAGE-XR) 500 MG 24 hr tablet TAKE 1 TABLET(500 MG) BY MOUTH TWICE DAILY WITH A MEAL   OVER THE COUNTER  MEDICATION Benefactor supplement   rosuvastatin (CRESTOR) 10 MG tablet TAKE 1 TABLET BY MOUTH EVERY DAY   Semaglutide (RYBELSUS) 3 MG TABS Take 3 mg by mouth daily.   [DISCONTINUED] glipiZIDE (GLUCOTROL) 5 MG tablet Take 0.5 tablet PO before breakfast and 0.5 tablet with supper   Current Facility-Administered Medications for the 05/12/21 encounter (Video Visit) with Caren Macadam, MD  Medication   0.9 %  sodium chloride infusion    Review of Systems  Constitutional:  Negative for chills, fatigue and fever.  Respiratory:  Negative for cough, chest tightness, shortness of breath and wheezing.   Cardiovascular:  Negative for chest pain, palpitations and leg swelling.   Objective:  LMP 07/29/2011       BP Readings from Last 3 Encounters:  04/07/21 130/80  01/20/21 110/84  01/15/21 132/70   Wt Readings from Last 3 Encounters:  04/07/21 180 lb (81.6 kg)  01/20/21 168 lb (76.2 kg)  01/15/21 176 lb 14.4 oz (80.2 kg)    EXAM:  GENERAL: alert, oriented, appears well and in no acute distress  HEENT: atraumatic, conjunctiva clear, no obvious abnormalities on inspection of external nose and ears  NECK: normal movements of the head and neck  LUNGS: on inspection no signs of respiratory distress, breathing rate appears normal, no obvious gross SOB, gasping or wheezing  CV: no obvious cyanosis  MS: moves all visible extremities without noticeable abnormality  PSYCH/NEURO: pleasant and cooperative, no obvious depression  or anxiety, speech and thought processing grossly intact. She is doing ok right now, just had increased stress recently. Children are very supportive, she just doesn't like to depend on them for things (like fixing car)   Assessment/Plan 1. Diabetes mellitus type II, non insulin dependent ([place]) She is willing to try rybelsus and will pick up sample and start this. She will stop the glipizide.  Discussed new medication(s) today with patient. Discussed potential  side effects and patient verbalized understanding. She will let me know how she does with this after starting medication and we will plan for 3 mo in person follow up to recheck A1C.  Return in about 3 months (around 08/12/2021) for Chronic condition visit.       I discussed the assessment and treatment plan with the patient. The patient was provided an opportunity to ask questions and all were answered. The patient agreed with the plan and demonstrated an understanding of the instructions.   The patient was advised to call back or seek an in-person evaluation if the symptoms worsen or if the condition fails to improve as anticipated.  I provided 15 minutes of face-to-face time during this encounter.   Micheline Rough, MD

## 2021-05-12 NOTE — Telephone Encounter (Signed)
-----   Message from Caren Macadam, MD sent at 05/12/2021  1:26 PM EST ----- Please set up follow up visit in 3 months time. And make sure her rybelsus sample is still up front. She didn't pick it up before.

## 2021-06-07 ENCOUNTER — Other Ambulatory Visit: Payer: Self-pay | Admitting: Family Medicine

## 2021-06-07 ENCOUNTER — Telehealth: Payer: Self-pay | Admitting: Family Medicine

## 2021-06-07 MED ORDER — RYBELSUS 7 MG PO TABS: 7.0000 mg | ORAL_TABLET | Freq: Every day | ORAL | 5 refills | Status: DC

## 2021-06-07 NOTE — Telephone Encounter (Signed)
Patient called because she is running out of the rybelsus and would like another sample or for the prescription to go ahead an be sent.   Please send prescription to  [id number] [place], [place] Phone:  [phone]  Fax:  [phone]            Please advise

## 2021-06-07 NOTE — Telephone Encounter (Signed)
Thank her for the treats! Hayley Fox - you were out the day that was delivered!). I have sent in the next dose (7mg ) and we can see what coverage looks like at the pharmacy.

## 2021-06-08 NOTE — Telephone Encounter (Signed)
Left a detailed message at the patient's cell number with the information below.   

## 2021-07-06 ENCOUNTER — Other Ambulatory Visit: Payer: Self-pay | Admitting: Family Medicine

## 2021-07-17 ENCOUNTER — Other Ambulatory Visit: Payer: Self-pay | Admitting: Family Medicine

## 2021-07-31 ENCOUNTER — Other Ambulatory Visit: Payer: Self-pay | Admitting: Family Medicine

## 2021-08-04 ENCOUNTER — Other Ambulatory Visit: Payer: Self-pay | Admitting: Family Medicine

## 2021-08-13 ENCOUNTER — Telehealth (INDEPENDENT_AMBULATORY_CARE_PROVIDER_SITE_OTHER): Payer: Medicare Other | Admitting: Family Medicine

## 2021-08-13 ENCOUNTER — Encounter: Payer: Self-pay | Admitting: Family Medicine

## 2021-08-13 VITALS — Ht 64.0 in | Wt 180.0 lb

## 2021-08-13 DIAGNOSIS — K5900 Constipation, unspecified: Secondary | ICD-10-CM | POA: Diagnosis not present

## 2021-08-13 MED ORDER — FREESTYLE LIBRE 14 DAY SENSOR MISC: 6 refills | Status: DC

## 2021-08-13 NOTE — Progress Notes (Signed)
Virtual Visit via Telephone Note  I connected with Hayley Fox on 08/13/21 at  3:00 PM EST by telephone and verified that I am speaking with the correct person using two identifiers.   I discussed the limitations, risks, security and privacy concerns of performing an evaluation and management service by telephone and the availability of in person appointments. I also discussed with the patient that there may be a patient responsible charge related to this service. The patient expressed understanding and agreed to proceed.  Location patient: home Location provider:  [facility]  [place], [place] [address]  Participants present for the call: patient, provider Patient did not have a visit in the prior 7 days to address this/these issue(s).   History of Present Illness: States that Bms are little small, hard balls. Taking stool softeners and just can't get them back to normal. Some days she goes days without BM. Food feels like it wants to go up instead of go down. Has been this way for a couple of months. Dulculax stool softener and laxative, womens laxative. Does go with these, but hard little balls. Trying to drink more water. Doesn't seem to help. No abd pain. But does really have to strain.   Has some dry patches around eye.  No vomiting. Just feels like belly is bigger.   Sugars are doing really well.     Observations/Objective: Patient sounds cheerful and well on the phone. I do not appreciate any SOB. Speech and thought processing are grossly intact. Patient reported vitals:  Assessment and Plan: 1. Constipation, unspecified constipation type Encouraged her to try miralax bid x 2 days, then daily. Ok to also take stool softener but hold off on other laxatives. Keep up with fluids. Keep up with good dietary fiber. If no improvement in sx through weekend (or if worsening) call office. Continue with miralax daily until stools become loose or she feels she  has relieved constipation and is having regular stools every day/other day.   Follow Up Instructions: prn   [id number] 5-10 [id number] 11-20 9443 21-30 I did not refer this patient for an OV in the next 24 hours for this/these issue(s).  I discussed the assessment and treatment plan with the patient. The patient was provided an opportunity to ask questions and all were answered. The patient agreed with the plan and demonstrated an understanding of the instructions.   The patient was advised to call back or seek an in-person evaluation if the symptoms worsen or if the condition fails to improve as anticipated.  I provided 15 minutes of non-face-to-face time during this encounter.   Micheline Rough, MD

## 2021-08-16 ENCOUNTER — Ambulatory Visit: Payer: Medicare (Managed Care) | Admitting: Family Medicine

## 2021-08-18 DIAGNOSIS — L700 Acne vulgaris: Secondary | ICD-10-CM | POA: Diagnosis not present

## 2021-08-18 DIAGNOSIS — L2089 Other atopic dermatitis: Secondary | ICD-10-CM | POA: Diagnosis not present

## 2021-08-20 ENCOUNTER — Telehealth: Payer: Self-pay | Admitting: *Deleted

## 2021-08-20 NOTE — Telephone Encounter (Signed)
Prior auth for [employer] 14 day sensor sent to [email or web address] Key: [id number] pending review by the patient's insurance. ?

## 2021-10-14 DIAGNOSIS — E119 Type 2 diabetes mellitus without complications: Secondary | ICD-10-CM | POA: Diagnosis not present

## 2021-10-14 LAB — HM DIABETES EYE EXAM

## 2021-10-26 ENCOUNTER — Encounter: Payer: Self-pay | Admitting: Family Medicine

## 2021-10-29 ENCOUNTER — Other Ambulatory Visit: Payer: Self-pay | Admitting: Family Medicine

## 2021-10-29 DIAGNOSIS — E785 Hyperlipidemia, unspecified: Secondary | ICD-10-CM

## 2021-11-03 ENCOUNTER — Ambulatory Visit (INDEPENDENT_AMBULATORY_CARE_PROVIDER_SITE_OTHER): Payer: Medicare Other

## 2021-11-03 VITALS — Ht 64.0 in | Wt 170.0 lb

## 2021-11-03 DIAGNOSIS — Z13820 Encounter for screening for osteoporosis: Secondary | ICD-10-CM | POA: Diagnosis not present

## 2021-11-03 DIAGNOSIS — Z0000 Encounter for general adult medical examination without abnormal findings: Secondary | ICD-10-CM

## 2021-11-03 NOTE — Progress Notes (Signed)
? ?Subjective:  ? Hayley Fox is a 66 y.o. female who presents for Medicare Annual (Subsequent) preventive examination. ? ?Review of Systems    ?Virtual Visit via Telephone Note ? ?I connected with  Hayley Fox on 11/03/21 at  9:00 AM EDT by telephone and verified that I am speaking with the correct person using two identifiers. ? ?Location: ?Patient: Home ?Provider: Office ?Persons participating in the virtual visit: patient/Nurse Health Advisor ?  ?I discussed the limitations, risks, security and privacy concerns of performing an evaluation and management service by telephone and the availability of in person appointments. The patient expressed understanding and agreed to proceed. ? ?Interactive audio and video telecommunications were attempted between this nurse and patient, however failed, due to patient having technical difficulties OR patient did not have access to video capability.  We continued and completed visit with audio only. ? ?Some vital signs may be absent or patient reported.  ? ?Criselda Peaches, LPN  ?Cardiac Risk Factors include: advanced age (>9mn, >>68women);hypertension ? ?   ?Objective:  ?  ?Today's Vitals  ? 11/03/21 0906  ?Weight: 170 lb (77.1 kg)  ?Height: '5\' 4"'$  (1.626 m)  ? ?Body mass index is 29.18 kg/m?. ? ? ?  11/03/2021  ?  9:18 AM 04/20/2018  ?  1:15 PM 12/05/2016  ?  1:29 PM 10/02/2015  ? 10:24 AM  ?Advanced Directives  ?Does Patient Have a Medical Advance Directive? No No No No  ?Would patient like information on creating a medical advance directive? No - Patient declined     ? ? ?Current Medications (verified) ?Outpatient Encounter Medications as of 11/03/2021  ?Medication Sig  ? albuterol (VENTOLIN HFA) 108 (90 Base) MCG/ACT inhaler TAKE 2 PUFFS BY MOUTH EVERY 6 HOURS AS NEEDED FOR WHEEZE OR SHORTNESS OF BREATH  ? amLODipine (NORVASC) 10 MG tablet TAKE 1 TABLET BY MOUTH EVERY DAY  ? clotrimazole (CVS CLOTRIMAZOLE) 1 % cream Apply 1 application topically 2 (two) times daily.  ?  Continuous Blood Gluc Receiver (FREESTYLE LIBRE 14 DAY READER) DEVI USE TO TEST BLOOD SUGAR  ? Continuous Blood Gluc Sensor (FREESTYLE LIBRE 14 DAY SENSOR) MISC USE AS DIRECTED  ? fluticasone (FLONASE) 50 MCG/ACT nasal spray SPRAY 1 SPRAY INTO BOTH NOSTRILS DAILY.  ? gabapentin (NEURONTIN) 100 MG capsule TAKE 2 CAPSULES BY MOUTH AT BEDTIME  ? JARDIANCE 10 MG TABS tablet TAKE 1 TABLET BY MOUTH EVERY DAY  ? losartan (COZAAR) 50 MG tablet TAKE 1 TABLET BY MOUTH EVERY DAY  ? metFORMIN (GLUCOPHAGE-XR) 500 MG 24 hr tablet TAKE 1 TABLET(500 MG) BY MOUTH TWICE DAILY WITH A MEAL  ? OVER THE COUNTER MEDICATION Benefactor supplement  ? rosuvastatin (CRESTOR) 10 MG tablet TAKE 1 TABLET BY MOUTH EVERY DAY  ? Semaglutide (RYBELSUS) 7 MG TABS Take 7 mg by mouth daily.  ? ?Facility-Administered Encounter Medications as of 11/03/2021  ?Medication  ? 0.9 %  sodium chloride infusion  ? ? ?Allergies (verified) ?Patient has no known allergies.  ? ?History: ?Past Medical History:  ?Diagnosis Date  ? Actinic keratosis 03/15/2012  ? Anemia   ? Blood transfusion without reported diagnosis   ? Childhood asthma   ? resolved  ? Diabetes mellitus without complication ([place]   ? Hyperlipidemia   ? Hypertension   ? 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 Qualifier: Diagnosis of  By: TSherren MochaMD, JJory Ee  ? ?Past Surgical History:  ?Procedure Laterality Date  ? CESAREAN SECTION    ? COLONOSCOPY  12/2016  ?  negative screening  ? ?Family History  ?Problem Relation Age of Onset  ? Diabetes Father   ? Hypertension Father   ? Hypertension Mother   ? Alcohol abuse Mother   ? Cirrhosis Mother   ? Diabetes Brother   ? High blood pressure Brother   ? High Cholesterol Brother   ? Colon cancer Neg Hx   ? ?Social History  ? ?Socioeconomic History  ? Marital status: Single  ?  Spouse name: Not on file  ? Number of children: Not on file  ? Years of education: Not on file  ? Highest education level: Not on file  ?Occupational History  ? Not on file  ?Tobacco Use  ?  Smoking status: Former  ?  Years: 1.00  ?  Types: Cigarettes  ? Smokeless tobacco: Never  ?Vaping Use  ? Vaping Use: Never used  ?Substance and Sexual Activity  ? Alcohol use: Yes  ?  Comment: socially-once a month per pt.  ? Drug use: No  ? Sexual activity: Not Currently  ?  Birth control/protection: None  ?Other Topics Concern  ? Not on file  ?Social History Narrative  ? Work or School: live in caregiver  ?   ? Home Situation: lives with client several days per week  ?   ? Spiritual Beliefs: catholic  ?   ? Lifestyle: exercises 2 days per week; diet is pretty healthy  ?   ? ?Social Determinants of Health  ? ?Financial Resource Strain: Low Risk   ? Difficulty of Paying Living Expenses: Not hard at all  ?Food Insecurity: No Food Insecurity  ? Worried About Charity fundraiser in the Last Year: Never true  ? Ran Out of Food in the Last Year: Never true  ?Transportation Needs: No Transportation Needs  ? Lack of Transportation (Medical): No  ? Lack of Transportation (Non-Medical): No  ?Physical Activity: Inactive  ? Days of Exercise per Week: 0 days  ? Minutes of Exercise per Session: 0 min  ?Stress: No Stress Concern Present  ? Feeling of Stress : Not at all  ?Social Connections: Moderately Isolated  ? Frequency of Communication with Friends and Family: More than three times a week  ? Frequency of Social Gatherings with Friends and Family: More than three times a week  ? Attends Religious Services: More than 4 times per year  ? Active Member of Clubs or Organizations: No  ? Attends Archivist Meetings: Never  ? Marital Status: Never married  ? ? ?Tobacco Counseling ?Counseling given: Not Answered ? ? ?Clinical Intake: ? ?Pre-visit preparation completed: NoNutrition Risk Assessment: ? ?Has the patient had any N/V/D within the last 2 months?  No  ?Does the patient have any non-healing wounds?  No  ?Has the patient had any unintentional weight loss or weight gain?  No  ? ?Diabetes: ? ?Is the patient diabetic?   Yes  ?If diabetic, was a CBG obtained today?  No  ?Did the patient bring in their glucometer from home?  No  ?How often do you monitor your CBG's? PRN.  ? ?Financial Strains and Diabetes Management: ? ?Are you having any financial strains with the device, your supplies or your medication? No .  ?Does the patient want to be seen by Chronic Care Management for management of their diabetes?  No  ?Would the patient like to be referred to a Nutritionist or for Diabetic Management?  No  ? ?Diabetic Exams: ? ?Diabetic Eye Exam: Completed Yes.  Overdue for diabetic eye exam. Pt has been advised about the importance in completing this exam. A referral has been placed today. Message sent to referral coordinator for scheduling purposes. Advised pt to expect a call from office referred to regarding appt. ? ?Diabetic Foot Exam: Completed Yes. Pt has been advised about the importance in completing this exam. Pt is scheduled for diabetic foot exam on Followed by PCP.   ?How often do you need to have someone help you when you read instructions, pamphlets, or other written materials from your doctor or pharmacy?: 1 - Never ? ?Diabetic?  Yes ? ? ? ?Activities of Daily Living ? ?  11/03/2021  ?  9:16 AM  ?In your present state of health, do you have any difficulty performing the following activities:  ?Hearing? 0  ?Vision? 0  ?Difficulty concentrating or making decisions? 0  ?Walking or climbing stairs? 0  ?Dressing or bathing? 0  ?Doing errands, shopping? 0  ?Preparing Food and eating ? N  ?Using the Toilet? N  ?In the past six months, have you accidently leaked urine? N  ?Do you have problems with loss of bowel control? N  ?Managing your Medications? N  ?Managing your Finances? N  ?Housekeeping or managing your Housekeeping? N  ? ? ?Patient Care Team: ?Caren Macadam, MD as PCP - General (Family Medicine) ?[place], [place], [place] as Consulting Physician (Optometry) ? ?Indicate any recent Medical Services you may have received from  other than [facility] providers in the past year (date may be approximate). ? ?   ?Assessment:  ? This is a routine wellness examination for Hayley Fox. ? ?Hearing/Vision screen ?Hearing Screening - Comments:: No difficulty he

## 2021-11-03 NOTE — Patient Instructions (Addendum)
?Hayley Fox , ?Thank you for taking time to come for your Medicare Wellness Visit. I appreciate your ongoing commitment to your health goals. Please review the following plan we discussed and let me know if I can assist you in the future.  ? ?These are the goals we discussed: ? Goals   ? ?   Get out of debt (pt-stated)   ?   I want to get completely out of debt. ?  ? ?  ?  ?This is a list of the screening recommended for you and due dates:  ?Health Maintenance  ?Topic Date Due  ? DEXA scan (bone density measurement)  Never done  ? Complete foot exam   04/10/2021  ? Hemoglobin A1C  10/17/2021  ? Zoster (Shingles) Vaccine (1 of 2) 02/03/2022*  ? Pneumonia Vaccine (1 - PCV) 11/04/2022*  ? Tetanus Vaccine  11/04/2022*  ? Flu Shot  01/18/2022  ? Mammogram  08/03/2022  ? Eye exam for diabetics  10/15/2022  ? Colon Cancer Screening  12/20/2026  ? Hepatitis C Screening: USPSTF Recommendation to screen - Ages 34-79 yo.  Completed  ? HPV Vaccine  Aged Out  ? COVID-19 Vaccine  Discontinued  ?*Topic was postponed. The date shown is not the original due date.  ?  ?Advanced directives: No ? ?Conditions/risks identified: None ? ?Next appointment: Follow up in one year for your annual wellness visit   ? ? ?Preventive Care 36 Years and Older, Female ?Preventive care refers to lifestyle choices and visits with your health care provider that can promote health and wellness. ?What does preventive care include? ?A yearly physical exam. This is also called an annual well check. ?Dental exams once or twice a year. ?Routine eye exams. Ask your health care provider how often you should have your eyes checked. ?Personal lifestyle choices, including: ?Daily care of your teeth and gums. ?Regular physical activity. ?Eating a healthy diet. ?Avoiding tobacco and drug use. ?Limiting alcohol use. ?Practicing safe sex. ?Taking low-dose aspirin every day. ?Taking vitamin and mineral supplements as recommended by your health care provider. ?What  happens during an annual well check? ?The services and screenings done by your health care provider during your annual well check will depend on your age, overall health, lifestyle risk factors, and family history of disease. ?Counseling  ?Your health care provider may ask you questions about your: ?Alcohol use. ?Tobacco use. ?Drug use. ?Emotional well-being. ?Home and relationship well-being. ?Sexual activity. ?Eating habits. ?History of falls. ?Memory and ability to understand (cognition). ?Work and work Statistician. ?Reproductive health. ?Screening  ?You may have the following tests or measurements: ?Height, weight, and BMI. ?Blood pressure. ?Lipid and cholesterol levels. These may be checked every 5 years, or more frequently if you are over 70 years old. ?Skin check. ?Lung cancer screening. You may have this screening every year starting at age 25 if you have a 30-pack-year history of smoking and currently smoke or have quit within the past 15 years. ?Fecal occult blood test (FOBT) of the stool. You may have this test every year starting at age 83. ?Flexible sigmoidoscopy or colonoscopy. You may have a sigmoidoscopy every 5 years or a colonoscopy every 10 years starting at age 35. ?Hepatitis C blood test. ?Hepatitis B blood test. ?Sexually transmitted disease (STD) testing. ?Diabetes screening. This is done by checking your blood sugar (glucose) after you have not eaten for a while (fasting). You may have this done every 1-3 years. ?Bone density scan. This is done to screen  for osteoporosis. You may have this done starting at age 84. ?Mammogram. This may be done every 1-2 years. Talk to your health care provider about how often you should have regular mammograms. ?Talk with your health care provider about your test results, treatment options, and if necessary, the need for more tests. ?Vaccines  ?Your health care provider may recommend certain vaccines, such as: ?Influenza vaccine. This is recommended every  year. ?Tetanus, diphtheria, and acellular pertussis (Tdap, Td) vaccine. You may need a Td booster every 10 years. ?Zoster vaccine. You may need this after age 51. ?Pneumococcal 13-valent conjugate (PCV13) vaccine. One dose is recommended after age 63. ?Pneumococcal polysaccharide (PPSV23) vaccine. One dose is recommended after age 20. ?Talk to your health care provider about which screenings and vaccines you need and how often you need them. ?This information is not intended to replace advice given to you by your health care provider. Make sure you discuss any questions you have with your health care provider. ?Document Released: 07/03/2015 Document Revised: 02/24/2016 Document Reviewed: 04/07/2015 ?Elsevier Interactive Patient Education ? 2017 [employer]. ? ?Fall Prevention in the Home ?Falls can cause injuries. They can happen to people of all ages. There are many things you can do to make your home safe and to help prevent falls. ?What can I do on the outside of my home? ?Regularly fix the edges of walkways and driveways and fix any cracks. ?Remove anything that might make you trip as you walk through a door, such as a raised step or threshold. ?Trim any bushes or trees on the path to your home. ?Use bright outdoor lighting. ?Clear any walking paths of anything that might make someone trip, such as rocks or tools. ?Regularly check to see if handrails are loose or broken. Make sure that both sides of any steps have handrails. ?Any raised decks and porches should have guardrails on the edges. ?Have any leaves, snow, or ice cleared regularly. ?Use sand or salt on walking paths during winter. ?Clean up any spills in your garage right away. This includes oil or grease spills. ?What can I do in the bathroom? ?Use night lights. ?Install grab bars by the toilet and in the tub and shower. Do not use towel bars as grab bars. ?Use non-skid mats or decals in the tub or shower. ?If you need to sit down in the shower, use a  plastic, non-slip stool. ?Keep the floor dry. Clean up any water that spills on the floor as soon as it happens. ?Remove soap buildup in the tub or shower regularly. ?Attach bath mats securely with double-sided non-slip rug tape. ?Do not have throw rugs and other things on the floor that can make you trip. ?What can I do in the bedroom? ?Use night lights. ?Make sure that you have a light by your bed that is easy to reach. ?Do not use any sheets or blankets that are too big for your bed. They should not hang down onto the floor. ?Have a firm chair that has side arms. You can use this for support while you get dressed. ?Do not have throw rugs and other things on the floor that can make you trip. ?What can I do in the kitchen? ?Clean up any spills right away. ?Avoid walking on wet floors. ?Keep items that you use a lot in easy-to-reach places. ?If you need to reach something above you, use a strong step stool that has a grab bar. ?Keep electrical cords out of the way. ?Do  not use floor polish or wax that makes floors slippery. If you must use wax, use non-skid floor wax. ?Do not have throw rugs and other things on the floor that can make you trip. ?What can I do with my stairs? ?Do not leave any items on the stairs. ?Make sure that there are handrails on both sides of the stairs and use them. Fix handrails that are broken or loose. Make sure that handrails are as long as the stairways. ?Check any carpeting to make sure that it is firmly attached to the stairs. Fix any carpet that is loose or worn. ?Avoid having throw rugs at the top or bottom of the stairs. If you do have throw rugs, attach them to the floor with carpet tape. ?Make sure that you have a light switch at the top of the stairs and the bottom of the stairs. If you do not have them, ask someone to add them for you. ?What else can I do to help prevent falls? ?Wear shoes that: ?Do not have high heels. ?Have rubber bottoms. ?Are comfortable and fit you  well. ?Are closed at the toe. Do not wear sandals. ?If you use a stepladder: ?Make sure that it is fully opened. Do not climb a closed stepladder. ?Make sure that both sides of the stepladder are locked into place. ?As

## 2022-01-03 ENCOUNTER — Other Ambulatory Visit: Payer: Self-pay | Admitting: Family

## 2022-01-03 ENCOUNTER — Telehealth: Payer: Self-pay | Admitting: Family Medicine

## 2022-01-03 MED ORDER — RYBELSUS 7 MG PO TABS: 7.0000 mg | ORAL_TABLET | Freq: Every day | ORAL | 0 refills | Status: DC

## 2022-01-03 NOTE — Telephone Encounter (Signed)
Pt requesting refill Semaglutide (RYBELSUS) 7 MG TABS  [id number] [address]:  [phone] Fax:  [phone]

## 2022-01-04 NOTE — Telephone Encounter (Signed)
Spoke with the patient, informed her a 30-day supply was sent as below and reminded her of the appt with Dr Legrand Como on 7/26.

## 2022-01-12 ENCOUNTER — Ambulatory Visit (INDEPENDENT_AMBULATORY_CARE_PROVIDER_SITE_OTHER): Payer: Medicare Other | Admitting: Family Medicine

## 2022-01-12 ENCOUNTER — Encounter: Payer: Self-pay | Admitting: Family Medicine

## 2022-01-12 VITALS — BP 124/70 | HR 50 | Temp 98.1°F | Ht 64.0 in | Wt 178.7 lb

## 2022-01-12 DIAGNOSIS — E1165 Type 2 diabetes mellitus with hyperglycemia: Secondary | ICD-10-CM | POA: Diagnosis not present

## 2022-01-12 DIAGNOSIS — R5383 Other fatigue: Secondary | ICD-10-CM

## 2022-01-12 DIAGNOSIS — Z1331 Encounter for screening for depression: Secondary | ICD-10-CM

## 2022-01-12 DIAGNOSIS — B3731 Acute candidiasis of vulva and vagina: Secondary | ICD-10-CM | POA: Diagnosis not present

## 2022-01-12 DIAGNOSIS — I10 Essential (primary) hypertension: Secondary | ICD-10-CM

## 2022-01-12 DIAGNOSIS — E119 Type 2 diabetes mellitus without complications: Secondary | ICD-10-CM

## 2022-01-12 LAB — POCT GLYCOSYLATED HEMOGLOBIN (HGB A1C): Hemoglobin A1C: 7.3 % — AB (ref 4.0–5.6)

## 2022-01-12 MED ORDER — FLUCONAZOLE 150 MG PO TABS: 150.0000 mg | ORAL_TABLET | Freq: Once | ORAL | 0 refills | Status: AC

## 2022-01-12 MED ORDER — METFORMIN HCL ER 500 MG PO TB24: ORAL_TABLET | ORAL | 3 refills | Status: DC

## 2022-01-12 NOTE — Progress Notes (Signed)
Established Patient Office Visit  Subjective   Patient ID: Hayley Fox, female    DOB: [birth year]  Age: 66 y.o. MRN: [id number]  Chief Complaint  Patient presents with   Establish Care   Fatigue    Patient complains of recurrent fatigue and  she "does not feel herself" for the past few months    Patient has a new complaint of fatigue. States that she goes to sleep around 12:30 am, states she works until 11 pm and it takes a while to wind down. She reports that she has frequent nighttime awakenings, states she often gets up to urinate, about 3 times every night. States that she knows it is related to what she eats during the day. Then she wakes around 5:30 - 6 am and tries to sleep beyond that however she cannot. States that when she goes to work she feels very tired throughout the day. States she has tried melatonin in the past with no improvement. Also states she has used benadryl in the past with some improvement.   I reviewed her PHQ -9 which is positive for signs/sx of depression. We discussed this at length and I offered medication to help however she declined at this time. She denies any SI or HI.   Patient is also reporting increasing vaginal itching, has been using OTC topical steroids at home with mild improvement. Reports no vaginal discharge, no burning with urination at this time.   Diabetes - patient reports she stopped taking the metformin after reading something about an increased risk of amputation with metformin. We discussed this information at length and I reassured the patient that uncontrolled sugar off of the metformin is a greater risk of foot amputation and that controlling her sugar will hep reduce her risk. Pt states she will resume the metformin. She reports she does not check her sugar at home, states her insurance will not cover the freestyle libre since she is not on insulin.   Patient Active Problem List   Diagnosis Date Noted   Uncontrolled type 2 diabetes  mellitus with hyperglycemia ([place]) 01/12/2022   Vaginal candidiasis 01/12/2022   Fatigue 01/12/2022   Pain of left sacroiliac joint 01/20/2021   Diabetes mellitus type II, non insulin dependent ([place]) 10/09/2015   Hyperlipemia 10/09/2015   Allergic rhinitis 04/18/2013   Essential hypertension 12/03/2008      Review of Systems  All other systems reviewed and are negative.     Objective:     BP 124/70 (BP Location: Right Arm, Patient Position: Sitting, Cuff Size: Normal)   Pulse (!) 50   Temp 98.1 F (36.7 C) (Oral)   Ht _0  (1.626 m)   Wt 178 lb 11.2 oz (81.1 kg)   LMP 07/29/2011   SpO2 96%   BMI 30.67 kg/m  BP Readings from Last 3 Encounters:  01/12/22 124/70  04/07/21 130/80  01/20/21 110/84   Wt Readings from Last 3 Encounters:  01/12/22 178 lb 11.2 oz (81.1 kg)  11/03/21 170 lb (77.1 kg)  08/13/21 180 lb (81.6 kg)      Physical Exam Vitals reviewed.  Constitutional:      Appearance: Normal appearance. She is well-groomed. She is obese.  HENT:     Head: Normocephalic and atraumatic.  Eyes:     Extraocular Movements: Extraocular movements intact.     Conjunctiva/sclera: Conjunctivae normal.     Pupils: Pupils are equal, round, and reactive to light.  Cardiovascular:     Rate and Rhythm:  Normal rate and regular rhythm.     Pulses: Normal pulses.          Dorsalis pedis pulses are 2+ on the right side and 2+ on the left side.       Posterior tibial pulses are 2+ on the right side and 2+ on the left side.     Heart sounds: S1 normal and S2 normal.  Pulmonary:     Effort: Pulmonary effort is normal.     Breath sounds: Normal breath sounds and air entry.  Abdominal:     General: Abdomen is flat. Bowel sounds are normal.     Palpations: Abdomen is soft.  Musculoskeletal:        General: Normal range of motion.     Cervical back: Normal range of motion and neck supple.     Right lower leg: No edema.     Left lower leg: No edema.  Feet:     Right foot:      Protective Sensation: 3 sites tested.  3 sites sensed.     Skin integrity: Skin integrity normal.     Left foot:     Protective Sensation: 3 sites tested.  3 sites sensed.     Skin integrity: Skin integrity normal.  Skin:    General: Skin is warm and dry.  Neurological:     Mental Status: She is alert and oriented to person, place, and time. Mental status is at baseline.     Gait: Gait is intact.  Psychiatric:        Mood and Affect: Mood and affect normal.        Speech: Speech normal.        Behavior: Behavior normal.        Judgment: Judgment normal.     Last hemoglobin A1c Lab Results  Component Value Date   HGBA1C 7.3 (A) 01/12/2022      The 10-year ASCVD risk score (Arnett DK, et al., 2019) is: 15.5%    Assessment & Plan:   Problem List Items Addressed This Visit       Cardiovascular and Mediastinum   Essential hypertension    Well controlled on current medications, denies side effects at this time. Continue to monitor at each visit.      Relevant Orders   TSH     Endocrine   Diabetes mellitus type II, non insulin dependent ([place]) - Primary    On Jardiance, Rybelsus, pt had stopped metformin on her own. See HPI. The increased urination at night is likely due to the jardiance combined with her late shift  Work schedule and uncontrolled blood sugars due to being off the metformin. Her A1C today is 7.3 which is actually better than the previous value. I recommend she go back on the metformin for better blood sugar control and to refrain from eating/drinking after 11 pm. Foot exam was performed today in office as well. I will see her back in 3 months for a repeat A1C and to reassess how her sleep patterns are doing.       Relevant Medications   metFORMIN (GLUCOPHAGE-XR) 500 MG 24 hr tablet   Other Relevant Orders   POC HgB A1c (Completed)   Urine microalbumin-creatinine with uACR   Comprehensive metabolic panel with eGFR (no eGFR if sent to [facility])   Lipid Panel    Uncontrolled type 2 diabetes mellitus with hyperglycemia ([facility])   Relevant Medications   metFORMIN (GLUCOPHAGE-XR) 500 MG 24 hr tablet  Genitourinary   Vaginal candidiasis   Relevant Medications   fluconazole (DIFLUCAN) 150 MG tablet     Other   Fatigue    Could be from lack of restorative sleep due to frequent nighttime awakenings to urinate, her late shift work schedule. Plus her jardiance causing increased urinations. She also screened positive for depression today which we had an extensive discussion about, her PHQ is moderate severity. I offered her medication to treat the depressive symptoms, however she declined at this time. I gave her a handout on sleep hygiene and recommended refraining from eating/drinking after 11 pm to reduce awakenings. I will follow up with her in 3 months to re-evaluate.      Relevant Orders   CBC with Differential/Platelets    Return in about 3 months (around 04/14/2022) for follow up sleep and fatigue.    Farrel Conners, MD

## 2022-01-12 NOTE — Patient Instructions (Addendum)
Avoid eating or drinking before bed  Take the diflucan for the itching.  Return to office for bloodwork.

## 2022-01-12 NOTE — Assessment & Plan Note (Addendum)
Could be from lack of restorative sleep due to frequent nighttime awakenings to urinate, her late shift work schedule. Plus her jardiance causing increased urinations. She also screened positive for depression today which we had an extensive discussion about, her PHQ is moderate severity. I offered her medication to treat the depressive symptoms, however she declined at this time. I gave her a handout on sleep hygiene and recommended refraining from eating/drinking after 11 pm to reduce awakenings. I will follow up with her in 3 months to re-evaluate.

## 2022-01-12 NOTE — Assessment & Plan Note (Signed)
Secondary to use of Jardiance, will treat with diflucan orally

## 2022-01-12 NOTE — Assessment & Plan Note (Signed)
On Jardiance, Rybelsus, pt had stopped metformin on her own. See HPI. The increased urination at night is likely due to the jardiance combined with her late shift  Work schedule and uncontrolled blood sugars due to being off the metformin. Her A1C today is 7.3 which is actually better than the previous value. I recommend she go back on the metformin for better blood sugar control and to refrain from eating/drinking after 11 pm. Foot exam was performed today in office as well. I will see her back in 3 months for a repeat A1C and to reassess how her sleep patterns are doing.

## 2022-01-12 NOTE — Assessment & Plan Note (Signed)
Well controlled on current medications, denies side effects at this time. Continue to monitor at each visit.

## 2022-01-13 ENCOUNTER — Other Ambulatory Visit (INDEPENDENT_AMBULATORY_CARE_PROVIDER_SITE_OTHER): Payer: Medicare Other

## 2022-01-13 DIAGNOSIS — R5383 Other fatigue: Secondary | ICD-10-CM | POA: Diagnosis not present

## 2022-01-13 DIAGNOSIS — E119 Type 2 diabetes mellitus without complications: Secondary | ICD-10-CM | POA: Diagnosis not present

## 2022-01-13 DIAGNOSIS — I10 Essential (primary) hypertension: Secondary | ICD-10-CM | POA: Diagnosis not present

## 2022-01-13 LAB — CBC WITH DIFFERENTIAL/PLATELET
Basophils Absolute: 0.1 10*3/uL (ref 0.0–0.1)
Basophils Relative: 2.1 % (ref 0.0–3.0)
Eosinophils Absolute: 0.2 10*3/uL (ref 0.0–0.7)
Eosinophils Relative: 3.3 % (ref 0.0–5.0)
HCT: 37.6 % (ref 36.0–46.0)
Hemoglobin: 12 g/dL (ref 12.0–15.0)
Lymphocytes Relative: 36.2 % (ref 12.0–46.0)
Lymphs Abs: 2.1 10*3/uL (ref 0.7–4.0)
MCHC: 31.9 g/dL (ref 30.0–36.0)
MCV: 79.7 fl (ref 78.0–100.0)
Monocytes Absolute: 0.5 10*3/uL (ref 0.1–1.0)
Monocytes Relative: 9.5 % (ref 3.0–12.0)
Neutro Abs: 2.8 10*3/uL (ref 1.4–7.7)
Neutrophils Relative %: 48.9 % (ref 43.0–77.0)
Platelets: 232 10*3/uL (ref 150.0–400.0)
RBC: 4.71 Mil/uL (ref 3.87–5.11)
RDW: 15.1 % (ref 11.5–15.5)
WBC: 5.7 10*3/uL (ref 4.0–10.5)

## 2022-01-13 LAB — COMPREHENSIVE METABOLIC PANEL
ALT: 24 U/L (ref 0–35)
AST: 24 U/L (ref 0–37)
Albumin: 4.3 g/dL (ref 3.5–5.2)
Alkaline Phosphatase: 86 U/L (ref 39–117)
BUN: 17 mg/dL (ref 6–23)
CO2: 24 mEq/L (ref 19–32)
Calcium: 8.8 mg/dL (ref 8.4–10.5)
Chloride: 107 mEq/L (ref 96–112)
Creatinine, Ser: 0.71 mg/dL (ref 0.40–1.20)
GFR: 88.74 mL/min (ref >60.00)
Glucose, Bld: 119 mg/dL — ABNORMAL HIGH (ref 70–99)
Potassium: 3.9 mEq/L (ref 3.5–5.1)
Sodium: 140 mEq/L (ref 135–145)
Total Bilirubin: 0.5 mg/dL (ref 0.2–1.2)
Total Protein: 7.1 g/dL (ref 6.0–8.3)

## 2022-01-13 LAB — MICROALBUMIN / CREATININE URINE RATIO
Creatinine,U: 133.4 mg/dL
Microalb Creat Ratio: 1.9 mg/g (ref 0.0–30.0)
Microalb, Ur: 2.6 mg/dL — ABNORMAL HIGH (ref 0.0–1.9)

## 2022-01-13 LAB — TSH: TSH: 1.83 u[IU]/mL (ref 0.35–5.50)

## 2022-01-13 LAB — LIPID PANEL
Cholesterol: 133 mg/dL (ref 0–200)
HDL: 50 mg/dL (ref >39.00)
LDL Cholesterol: 68 mg/dL (ref 0–99)
NonHDL: 83.2
Total CHOL/HDL Ratio: 3
Triglycerides: 76 mg/dL (ref 0.0–149.0)
VLDL: 15.2 mg/dL (ref 0.0–40.0)

## 2022-01-13 NOTE — Addendum Note (Signed)
Addended by: Octavio Manns E on: 01/13/2022 07:20 AM   Modules accepted: Orders

## 2022-01-25 ENCOUNTER — Other Ambulatory Visit: Payer: Self-pay | Admitting: *Deleted

## 2022-01-25 MED ORDER — AMLODIPINE BESYLATE 10 MG PO TABS: 10.0000 mg | ORAL_TABLET | Freq: Every day | ORAL | 1 refills | Status: DC

## 2022-01-27 ENCOUNTER — Other Ambulatory Visit: Payer: Self-pay | Admitting: *Deleted

## 2022-01-30 MED ORDER — EMPAGLIFLOZIN 10 MG PO TABS
10.0000 mg | ORAL_TABLET | Freq: Every day | ORAL | 1 refills | Status: DC
Start: 2022-01-30 — End: 2022-04-19

## 2022-01-31 ENCOUNTER — Emergency Department (HOSPITAL_BASED_OUTPATIENT_CLINIC_OR_DEPARTMENT_OTHER): Payer: Medicare Other

## 2022-01-31 ENCOUNTER — Other Ambulatory Visit: Payer: Self-pay

## 2022-01-31 ENCOUNTER — Emergency Department (HOSPITAL_BASED_OUTPATIENT_CLINIC_OR_DEPARTMENT_OTHER)
Admission: EM | Admit: 2022-01-31 | Discharge: 2022-01-31 | Disposition: A | Discharge status: Home / Self Care | Payer: Medicare Other | Attending: Emergency Medicine | Admitting: Emergency Medicine

## 2022-01-31 ENCOUNTER — Encounter (HOSPITAL_BASED_OUTPATIENT_CLINIC_OR_DEPARTMENT_OTHER): Payer: Self-pay | Admitting: Emergency Medicine

## 2022-01-31 DIAGNOSIS — K5900 Constipation, unspecified: Secondary | ICD-10-CM | POA: Insufficient documentation

## 2022-01-31 DIAGNOSIS — R1032 Left lower quadrant pain: Secondary | ICD-10-CM | POA: Diagnosis not present

## 2022-01-31 DIAGNOSIS — M7918 Myalgia, other site: Secondary | ICD-10-CM

## 2022-01-31 DIAGNOSIS — R109 Unspecified abdominal pain: Secondary | ICD-10-CM | POA: Diagnosis present

## 2022-01-31 DIAGNOSIS — Z79899 Other long term (current) drug therapy: Secondary | ICD-10-CM | POA: Insufficient documentation

## 2022-01-31 DIAGNOSIS — I700 Atherosclerosis of aorta: Secondary | ICD-10-CM | POA: Diagnosis not present

## 2022-01-31 LAB — URINALYSIS, ROUTINE W REFLEX MICROSCOPIC
Bilirubin Urine: NEGATIVE
Glucose, UA: >1000 mg/dL — AB
Hgb urine dipstick: NEGATIVE
Leukocytes,Ua: NEGATIVE
Nitrite: NEGATIVE
Protein, ur: NEGATIVE mg/dL
Specific Gravity, Urine: 1.031 — ABNORMAL HIGH (ref 1.005–1.030)
pH: 5.5 (ref 5.0–8.0)

## 2022-01-31 LAB — CBC WITH DIFFERENTIAL/PLATELET
Abs Immature Granulocytes: 0.02 10*3/uL (ref 0.00–0.07)
Basophils Absolute: 0.1 10*3/uL (ref 0.0–0.1)
Basophils Relative: 1 %
Eosinophils Absolute: 0.1 10*3/uL (ref 0.0–0.5)
Eosinophils Relative: 2 %
HCT: 40.6 % (ref 36.0–46.0)
Hemoglobin: 12.8 g/dL (ref 12.0–15.0)
Immature Granulocytes: 0 %
Lymphocytes Relative: 36 %
Lymphs Abs: 2 10*3/uL (ref 0.7–4.0)
MCH: 25.1 pg — ABNORMAL LOW (ref 26.0–34.0)
MCHC: 31.5 g/dL (ref 30.0–36.0)
MCV: 79.8 fL — ABNORMAL LOW (ref 80.0–100.0)
Monocytes Absolute: 0.5 10*3/uL (ref 0.1–1.0)
Monocytes Relative: 9 %
Neutro Abs: 2.8 10*3/uL (ref 1.7–7.7)
Neutrophils Relative %: 52 %
Platelets: 255 10*3/uL (ref 150–400)
RBC: 5.09 MIL/uL (ref 3.87–5.11)
RDW: 14.6 % (ref 11.5–15.5)
WBC: 5.5 10*3/uL (ref 4.0–10.5)
nRBC: 0 % (ref 0.0–0.2)

## 2022-01-31 LAB — COMPREHENSIVE METABOLIC PANEL
ALT: 13 U/L (ref 0–44)
AST: 15 U/L (ref 15–41)
Albumin: 4.7 g/dL (ref 3.5–5.0)
Alkaline Phosphatase: 76 U/L (ref 38–126)
Anion gap: 10 (ref 5–15)
BUN: 18 mg/dL (ref 8–23)
CO2: 24 mmol/L (ref 22–32)
Calcium: 9.2 mg/dL (ref 8.9–10.3)
Chloride: 106 mmol/L (ref 98–111)
Creatinine, Ser: 0.71 mg/dL (ref 0.44–1.00)
GFR, Estimated: >60 mL/min (ref >60)
Glucose, Bld: 94 mg/dL (ref 70–99)
Potassium: 4.1 mmol/L (ref 3.5–5.1)
Sodium: 140 mmol/L (ref 135–145)
Total Bilirubin: 0.8 mg/dL (ref 0.3–1.2)
Total Protein: 7.6 g/dL (ref 6.5–8.1)

## 2022-01-31 LAB — LIPASE, BLOOD: Lipase: 46 U/L (ref 11–51)

## 2022-01-31 LAB — MAGNESIUM: Magnesium: 2.1 mg/dL (ref 1.7–2.4)

## 2022-01-31 LAB — TROPONIN I (HIGH SENSITIVITY)
Troponin I (High Sensitivity): 6 ng/L (ref <18)
Troponin I (High Sensitivity): 6 ng/L (ref <18)

## 2022-01-31 LAB — LACTIC ACID, PLASMA: Lactic Acid, Venous: 1.1 mmol/L (ref 0.5–1.9)

## 2022-01-31 MED ORDER — LIDOCAINE 5 % EX PTCH: 1.0000 | MEDICATED_PATCH | CUTANEOUS | 0 refills | Status: DC

## 2022-01-31 MED ORDER — MAGNESIUM CITRATE PO SOLN: 0.5000 | Freq: Once | ORAL | 0 refills | Status: AC

## 2022-01-31 MED ORDER — IOHEXOL 300 MG/ML  SOLN
100.0000 mL | Freq: Once | INTRAMUSCULAR | Status: AC | PRN
Start: 2022-01-31 — End: 2022-01-31
  Administered 2022-01-31: 80 mL via INTRAVENOUS

## 2022-01-31 MED ORDER — LIDOCAINE 5 % EX PTCH
1.0000 | MEDICATED_PATCH | CUTANEOUS | 0 refills | Status: DC
Start: 2022-01-31 — End: 2022-11-07

## 2022-01-31 NOTE — ED Provider Notes (Signed)
[facility] DEPT Provider Note   CSN: [id number] Arrival date & time: 01/31/22  5093     History  Chief Complaint  Patient presents with   Flank Pain    Hayley Fox is a 66 y.o. female.  The history is provided by the patient and medical records. No language interpreter was used.  Flank Pain This is a new problem. The current episode started more than 2 days ago. The problem occurs constantly. The problem has not changed since onset.Associated symptoms include abdominal pain. Pertinent negatives include no chest pain, no headaches and no shortness of breath. Exacerbated by: moving. Nothing (being s2till) relieves the symptoms. She has tried nothing for the symptoms. The treatment provided no relief.       Home Medications Prior to Admission medications   Medication Sig Start Date End Date Taking? Authorizing Provider  albuterol (VENTOLIN HFA) 108 (90 Base) MCG/ACT inhaler TAKE 2 PUFFS BY MOUTH EVERY 6 HOURS AS NEEDED FOR WHEEZE OR SHORTNESS OF BREATH 02/18/21   Koberlein, Andris Flurry C, MD  amLODipine (NORVASC) 10 MG tablet Take 1 tablet (10 mg total) by mouth daily. 01/25/22   Farrel Conners, MD  clotrimazole ([facility] CLOTRIMAZOLE) 1 % cream Apply 1 application topically 2 (two) times daily. 01/15/21   Koberlein, Steele Berg, MD  empagliflozin (JARDIANCE) 10 MG TABS tablet Take 1 tablet (10 mg total) by mouth daily. 01/30/22   Farrel Conners, MD  fluticasone (FLONASE) 50 MCG/ACT nasal spray SPRAY 1 SPRAY INTO BOTH NOSTRILS DAILY. 03/01/21   Burchette, Alinda Sierras, MD  losartan (COZAAR) 50 MG tablet TAKE 1 TABLET BY MOUTH EVERY DAY 10/29/21   Caren Macadam, MD  metFORMIN (GLUCOPHAGE-XR) 500 MG 24 hr tablet TAKE 1 TABLET(500 MG) BY MOUTH TWICE DAILY WITH A MEAL 01/12/22   Farrel Conners, MD  OVER THE COUNTER MEDICATION Benefactor supplement    [provider]  rosuvastatin (CRESTOR) 10 MG tablet TAKE 1 TABLET BY MOUTH EVERY DAY 10/29/21   Koberlein, Steele Berg,  MD  Semaglutide (RYBELSUS) 7 MG TABS Take 7 mg by mouth daily. 01/03/22   Kennyth Arnold, FNP      Allergies    Patient has no known allergies.    Review of Systems   Review of Systems  Constitutional:  Positive for fatigue. Negative for chills, diaphoresis and fever.  HENT:  Negative for congestion.   Eyes:  Negative for visual disturbance.  Respiratory:  Negative for cough, chest tightness, shortness of breath and wheezing.   Cardiovascular:  Negative for chest pain, palpitations and leg swelling.  Gastrointestinal:  Positive for abdominal distention, abdominal pain, constipation and nausea. Negative for anal bleeding, diarrhea and vomiting.  Genitourinary:  Positive for flank pain and frequency. Negative for dysuria.  Musculoskeletal:  Positive for back pain. Negative for neck pain and neck stiffness.  Skin:  Negative for rash and wound.  Neurological:  Negative for dizziness, weakness, light-headedness and headaches.  Psychiatric/Behavioral:  Negative for agitation and confusion.   All other systems reviewed and are negative.   Physical Exam Updated Vital Signs BP 132/83   Pulse 80   Temp 99 F (37.2 C) (Oral)   Resp 15   Ht '5\' 4"'$  (1.626 m)   Wt 78 kg   LMP 07/29/2011   SpO2 100%   BMI 29.52 kg/m  Physical Exam Vitals and nursing note reviewed.  Constitutional:      General: She is not in acute distress.    Appearance: She  is well-developed. She is not ill-appearing, toxic-appearing or diaphoretic.  HENT:     Head: Normocephalic and atraumatic.     Mouth/Throat:     Mouth: Mucous membranes are moist.     Pharynx: No oropharyngeal exudate or posterior oropharyngeal erythema.  Eyes:     Extraocular Movements: Extraocular movements intact.     Conjunctiva/sclera: Conjunctivae normal.     Pupils: Pupils are equal, round, and reactive to light.  Cardiovascular:     Rate and Rhythm: Normal rate and regular rhythm.     Heart sounds: No murmur heard. Pulmonary:      Effort: Pulmonary effort is normal. No respiratory distress.     Breath sounds: Normal breath sounds. No wheezing, rhonchi or rales.  Chest:     Chest wall: No tenderness.  Abdominal:     General: Abdomen is flat. There is distension.     Palpations: Abdomen is soft.     Tenderness: There is abdominal tenderness. There is left CVA tenderness. There is no right CVA tenderness, guarding or rebound.    Musculoskeletal:        General: Tenderness present. No swelling.     Cervical back: Neck supple. No tenderness.     Right lower leg: No edema.     Left lower leg: No edema.  Skin:    General: Skin is warm and dry.     Capillary Refill: Capillary refill takes less than 2 seconds.     Findings: No erythema or rash.  Neurological:     General: No focal deficit present.     Mental Status: She is alert.     Sensory: No sensory deficit.  Psychiatric:        Mood and Affect: Mood normal.     ED Results / Procedures / Treatments   Labs (all labs ordered are listed, but only abnormal results are displayed) Labs Reviewed  CBC WITH DIFFERENTIAL/PLATELET - Abnormal; Notable for the following components:      Result Value   MCV 79.8 (*)    MCH 25.1 (*)    All other components within normal limits  URINALYSIS, ROUTINE W REFLEX MICROSCOPIC - Abnormal; Notable for the following components:   Specific Gravity, Urine 1.031 (*)    Glucose, UA >1,000 (*)    Ketones, ur TRACE (*)    Bacteria, UA RARE (*)    All other components within normal limits  URINE CULTURE  MAGNESIUM  COMPREHENSIVE METABOLIC PANEL  LIPASE, BLOOD  LACTIC ACID, PLASMA  TSH  TROPONIN I (HIGH SENSITIVITY)  TROPONIN I (HIGH SENSITIVITY)    EKG EKG Interpretation  Date/Time:  Monday January 31 2022 08:11:15 EDT Ventricular Rate:  111 PR Interval:  174 QRS Duration: 137 QT Interval:  370 QTC Calculation: 461 R Axis:   36 Text Interpretation: Sinus tachycardia Paired ventricular premature complexes Nonspecific  intraventricular conduction delay Nonspecific T abnormalities, lateral leads when compared to prior, faster rate and new PVC. also new t wave inversions in V4-V6. no STEMI Confirmed by Antony Blackbird [phone]) on 01/31/2022 8:13:51 AM  Radiology CT ABDOMEN PELVIS W CONTRAST  Result Date: 01/31/2022 CLINICAL DATA:  66 year old female left lower quadrant abdominal pain, flank pain. Constipation. EXAM: CT ABDOMEN AND PELVIS WITH CONTRAST TECHNIQUE: Multidetector CT imaging of the abdomen and pelvis was performed using the standard protocol following bolus administration of intravenous contrast. RADIATION DOSE REDUCTION: This exam was performed according to the departmental dose-optimization program which includes automated exposure control, adjustment of the mA and/or  kV according to patient size and/or use of iterative reconstruction technique. CONTRAST:  73m OMNIPAQUE IOHEXOL 300 MG/ML  SOLN COMPARISON:  None Available. FINDINGS: Lower chest: Mild cardiomegaly. No pericardial effusion. Negative lung bases, mild right lower lobe curvilinear scarring. Hepatobiliary: Gallbladder Phrygian cap (normal variant). Negative liver. No bile duct enlargement. Pancreas: Negative. Spleen: Diminutive, negative. Adrenals/Urinary Tract: Normal adrenal glands. Symmetric renal enhancement and contrast excretion. No convincing nephrolithiasis or pararenal inflammation. Diminutive and unremarkable bladder. Stomach/Bowel: Extensive diverticulosis of the descending and sigmoid colon. No active inflammation identified. Abundant retained stool throughout the large bowel. Redundant transverse colon. Normal appendix (series 2, image 54). No large bowel inflammation identified. Negative terminal ileum. No dilated small bowel. Small gastric hiatal hernia versus phrenic ampulla (normal variant). Otherwise negative stomach and duodenum. No free air, free fluid, or mesenteric inflammation identified. Vascular/Lymphatic: Aortoiliac calcified  atherosclerosis. Major arterial structures are patent. Portal venous system is patent. No lymphadenopathy identified. Reproductive: Heterogeneous lower uterine segment on series 6, image 625is probably a fibroid, but the endometrial canal also appears heterogeneous and partially calcified on series 6, image 65. Adnexa are within normal limits. Other: No pelvic free fluid.  Occasional pelvic phleboliths. Musculoskeletal: No acute osseous abnormality identified. IMPRESSION: 1. No acute or inflammatory process identified in the abdomen. Redundant large bowel with abundant retained stool. Extensive diverticulosis of the descending and sigmoid colon but no active inflammation. 2. Evidence of Fibroid uterus, but difficult to exclude a small endometrial mass. Query postmenopausal bleeding. Recommend follow-up with OB-GYN. 3. Mild cardiomegaly.  Aortic Atherosclerosis (ICD10-I70.0). Electronically Signed   By: HGenevie AnnM.D.   On: 01/31/2022 09:31    Procedures Procedures    Medications Ordered in ED Medications  iohexol (OMNIPAQUE) 300 MG/ML solution 100 mL (80 mLs Intravenous Contrast Given 01/31/22 0915)    ED Course/ Medical Decision Making/ A&P                           Medical Decision Making Amount and/or Complexity of Data Reviewed Labs: ordered. Radiology: ordered.  Risk OTC drugs. Prescription drug management.    JEllis Koffleris a 66y.o. female with a past medical history significant for hypertension, hyperlipidemia, diabetes, and anemia who presents with urinary frequency, left flank pain, abdominal distention, constipation worsening, and left abdominal pain.  According to patient, since Thursday, for the last 5 days, patient has had abdominal distention and left-sided abdominal pain from the left lower quadrant left flank and left back.  She reports she has had urinary frequency but denies dysuria or hematuria.  Denies history of kidney stones or known diverticulitis.  She says that she has  been constipated for several months ever since changing her diabetes medications but is trying to take Dulcolax and other stool softeners to help.  She reports no palpitations, chest pain, shortness of breath.  Denies lightheadedness or syncope.  She denies any headache, neck pain, or other pains.  He does report some nausea but no vomiting.  Denies any rash to suggest shingles.  Denies trauma or injuries.  She reports that this pain waxes and wanes but worse when she tries to move around.  It gets greater than 10 out of 10 at times and is more sharp.  On exam, lungs clear.  Chest nontender.  Left CVA and left flank is tender to palpation as is the left lower quadrant.  Bowel sounds are appreciated but patient feels her abdomen is distended.  No  murmur.  Anterior chest was nontender.  Good pulses in extremities.  No rash.  Patient otherwise resting comfortably.  Of note, patient's telemetry shows numerous PVCs with intermittent bigeminy however patient is completely asymptomatic from a cardiac standpoint with no palpitations chest pain lightheadedness or shortness of breath.  EKG shows no STEMI but does show new T wave inversions as well as tachycardia and PVCs.  Given these new T wave inversions and her left side pain, will add on troponin.  Given patient's location of discomfort and her urinary frequency, I do suspect she could have a kidney stone or pyelonephritis however given the abdominal distention, constipation, and the left side abdominal pain, things like diverticulitis or bowel obstruction are also considered.  With the numerous PVCs, we will check electrolytes and labs and will get CT abdomen pelvis to look for diverticulitis, obstruction, or kidney stone.  We will check urinalysis.  Patient says she does not have a ride at this time so she agrees to hold on pain medicine as her pain is not severe when she is sitting still.  She reports she is not nauseous currently so we will hold on nausea  medicine.  Anticipate reassessment after work-up is completed.  Who went through all the findings together.  The CT scan shows redundant colon and constipation with stool but otherwise no acute abnormality.  We also discussed the fibroid uterus that could have mass which she will follow-up with OB/GYN about.  Patient agrees.  Clinically suspect she has constipation causing her discomfort versus musculoskeletal pain on her flank and back.  Patient requested Lidoderm patch which we prescribed and she has MiraLAX at home to start using.  She also wanted something stronger to help her start having a bowel movement and requested mag citrate.  We will give her a prescription for a dose of this.  She understands to maintain hydration and rest and follow-up with her primary doctor.  Other work-up overall reassuring as we discussed.  Patient no other questions or concerns and understood plan of care.  Patient discharged in good condition.         Final Clinical Impression(s) / ED Diagnoses Final diagnoses:  Left flank pain  Constipation, unspecified constipation type  Musculoskeletal pain    Rx / DC Orders ED Discharge Orders          Ordered    lidocaine (LIDODERM) 5 %  Every 24 hours,   Status:  Discontinued        01/31/22 1151    magnesium citrate SOLN   Once        01/31/22 1151    lidocaine (LIDODERM) 5 %  Every 24 hours        01/31/22 1154            Clinical Impression: 1. Left flank pain   2. Constipation, unspecified constipation type   3. Musculoskeletal pain     Disposition: Discharge  Condition: Good  I have discussed the results, Dx and Tx plan with the pt(& family if present). He/she/they expressed understanding and agree(s) with the plan. Discharge instructions discussed at great length. Strict return precautions discussed and pt &/or family have verbalized understanding of the instructions. No further questions at time of discharge.    New Prescriptions    LIDOCAINE (LIDODERM) 5 %    Place 1 patch onto the skin daily. Remove & Discard patch within 12 hours or as directed by MD   [place]  Take 148 mLs (0.5 Bottles total) by mouth once for 1 dose.    Follow Up: Farrel Conners, MD [place] [address] [phone]     [facility] [place] [address] [phone]        Hayley Fox, Gwenyth Allegra, MD 01/31/22 [phone]

## 2022-01-31 NOTE — ED Notes (Signed)
Pt d/c home per MD order. Discharge summary reviewed, pt verbalizes understanding. Ambulatory off unit. No s/s of acute distress noted at discharge.  °

## 2022-01-31 NOTE — Discharge Instructions (Signed)
Your history, exam and work-up today are consistent with constipation likely causing her left abdominal and flank pain however due to the sharp nature on the back we did recommend using the Lidoderm patches to help.  We had a discussion and recommend you start taking MiraLAX daily for the next week but you also requested a dose of magnesium citrate to use at home to help start having a large bowel movement.  This was reasonable to please make sure you are staying hydrated.  Please follow-up with your primary doctor and rest.  If any symptoms change or worsen acutely, please return to the nearest emergency department.

## 2022-01-31 NOTE — ED Triage Notes (Signed)
Pt arrives to ED with c/o left sided flank pain. This started x5 days ago and has been on-going. Associated symptoms include urinary frequency and constipation. Pt reports the pain started after straining during a BM.

## 2022-02-01 LAB — URINE CULTURE: Culture: <10000 — AB

## 2022-02-02 ENCOUNTER — Telehealth: Payer: Self-pay | Admitting: Family Medicine

## 2022-02-02 DIAGNOSIS — B3731 Acute candidiasis of vulva and vagina: Secondary | ICD-10-CM

## 2022-02-02 NOTE — Telephone Encounter (Signed)
Did she mean the metformin? Either way it's ok to reduce the dose or take it every other day, but her blood sugars may increase. She should come back to see me in 3-4 months for another A1C if she is changing her medications at home.Marland KitchenMarland KitchenMarland Kitchen

## 2022-02-02 NOTE — Telephone Encounter (Signed)
Pt states she would like to get back off of her Jardiance due to her symptoms that she discussed with Dr. Marcell Anger has come back. Pt would like a call back.

## 2022-02-03 MED ORDER — FLUCONAZOLE 150 MG PO TABS: 150.0000 mg | ORAL_TABLET | ORAL | 0 refills | Status: AC

## 2022-02-03 NOTE — Telephone Encounter (Signed)
Ok sure I'll send the rx

## 2022-02-03 NOTE — Telephone Encounter (Signed)
Spoke with the patient and informed her of the message below.  Patient stated she is referring to [facility] and this has caused recurrent vaginal itching.  Patient requested another Rx for Diflucan be sent to [facility].  Message sent to PCP.

## 2022-02-03 NOTE — Telephone Encounter (Signed)
Noted  

## 2022-02-04 ENCOUNTER — Other Ambulatory Visit: Payer: Self-pay | Admitting: Family

## 2022-02-08 ENCOUNTER — Telehealth: Payer: Self-pay | Admitting: Family Medicine

## 2022-02-08 MED ORDER — RYBELSUS 7 MG PO TABS: 7.0000 mg | ORAL_TABLET | Freq: Every day | ORAL | 0 refills | Status: DC

## 2022-02-08 NOTE — Telephone Encounter (Signed)
Ok to refill 

## 2022-02-08 NOTE — Telephone Encounter (Signed)
Pt called to request a refill of the  Semaglutide (RYBELSUS) 7 MG TABS  LOV:  01/12/22 (TOC)  Please advise.  [id number]  [phone] Fax:  [phone]

## 2022-02-08 NOTE — Telephone Encounter (Signed)
Rx done. 

## 2022-03-15 ENCOUNTER — Other Ambulatory Visit: Payer: Self-pay | Admitting: *Deleted

## 2022-03-15 DIAGNOSIS — E785 Hyperlipidemia, unspecified: Secondary | ICD-10-CM

## 2022-03-15 MED ORDER — ROSUVASTATIN CALCIUM 10 MG PO TABS
10.0000 mg | ORAL_TABLET | Freq: Every day | ORAL | 1 refills | Status: DC
Start: 2022-03-15 — End: 2022-10-17

## 2022-03-15 MED ORDER — LOSARTAN POTASSIUM 50 MG PO TABS
50.0000 mg | ORAL_TABLET | Freq: Every day | ORAL | 1 refills | Status: DC
Start: 2022-03-15 — End: 2022-10-17

## 2022-04-18 ENCOUNTER — Ambulatory Visit: Payer: Medicare Other | Admitting: Family Medicine

## 2022-04-19 ENCOUNTER — Ambulatory Visit (INDEPENDENT_AMBULATORY_CARE_PROVIDER_SITE_OTHER): Payer: Medicare Other | Admitting: Family Medicine

## 2022-04-19 ENCOUNTER — Encounter: Payer: Self-pay | Admitting: Family Medicine

## 2022-04-19 ENCOUNTER — Other Ambulatory Visit: Payer: Self-pay | Admitting: Family Medicine

## 2022-04-19 VITALS — BP 112/80 | HR 94 | Temp 98.4°F | Ht 64.0 in | Wt 178.1 lb

## 2022-04-19 DIAGNOSIS — E119 Type 2 diabetes mellitus without complications: Secondary | ICD-10-CM | POA: Diagnosis not present

## 2022-04-19 DIAGNOSIS — B3731 Acute candidiasis of vulva and vagina: Secondary | ICD-10-CM | POA: Diagnosis not present

## 2022-04-19 LAB — POCT GLYCOSYLATED HEMOGLOBIN (HGB A1C): Hemoglobin A1C: 6.9 % — AB (ref 4.0–5.6)

## 2022-04-19 MED ORDER — CLOTRIMAZOLE 1 % EX CREA: 1.0000 | TOPICAL_CREAM | Freq: Two times a day (BID) | CUTANEOUS | 0 refills | Status: DC

## 2022-04-19 MED ORDER — METFORMIN HCL ER (MOD) 1000 MG PO TB24: ORAL_TABLET | ORAL | 3 refills | Status: DC

## 2022-04-19 MED ORDER — FLUCONAZOLE 150 MG PO TABS: 150.0000 mg | ORAL_TABLET | ORAL | 0 refills | Status: DC

## 2022-04-19 MED ORDER — RYBELSUS 14 MG PO TABS: 14.0000 mg | ORAL_TABLET | Freq: Every day | ORAL | 11 refills | Status: DC

## 2022-04-19 NOTE — Progress Notes (Signed)
Established Patient Office Visit  Subjective   Patient ID: Hayley Fox, female    DOB: 11-03-1955  Age: 66 y.o. MRN: [id number]  Chief Complaint  Patient presents with   Follow-up    DM-- patient reports that she continues to have vaginal itching. States she thinks it is the jardiance causing this side effect. States that the tablets got rid of the previous yeast infection, however it returned about 3 weeks later. We discussed stopping the jardiance and increasing her other medications to cover her blood sugars. She is agreeable to this plan.   Yeast infection-- recurrent secondary to the jardiance. Will give her another course of diflucan.  Sleep - pt reports her sleeping patterns are about the same. Still getting up at night to use the BR. States it depends on what she eats. She goes to sleep around 1 am (gets off of work around 11pm). States that she is trying to manage it through changing her diet.       Current Outpatient Medications  Medication Instructions   albuterol (VENTOLIN HFA) 108 (90 Base) MCG/ACT inhaler TAKE 2 PUFFS BY MOUTH EVERY 6 HOURS AS NEEDED FOR WHEEZE OR SHORTNESS OF BREATH   amLODipine (NORVASC) 10 mg, Oral, Daily   clotrimazole ([facility] CLOTRIMAZOLE) 1 % cream 1 Application, Topical, 2 times daily   fluconazole (DIFLUCAN) 150 mg, Oral, every 72 hours   fluticasone (FLONASE) 50 MCG/ACT nasal spray SPRAY 1 SPRAY INTO BOTH NOSTRILS DAILY.   lidocaine (LIDODERM) 5 % 1 patch, Transdermal, Every 24 hours, Remove & Discard patch within 12 hours or as directed by MD   losartan (COZAAR) 50 mg, Oral, Daily   metFORMIN (GLUMETZA) 1000 MG (MOD) 24 hr tablet TAKE 1 TABLET(500 MG) BY MOUTH TWICE DAILY WITH A MEAL   OVER THE COUNTER MEDICATION Benefactor supplement   rosuvastatin (CRESTOR) 10 mg, Oral, Daily   Rybelsus 14 mg, Oral, Daily    Patient Active Problem List   Diagnosis Date Noted   Uncontrolled type 2 diabetes mellitus with hyperglycemia ([place]) 01/12/2022    Vaginal candidiasis 01/12/2022   Fatigue 01/12/2022   Pain of left sacroiliac joint 01/20/2021   Diabetes mellitus type II, non insulin dependent ([place]) 10/09/2015   Hyperlipemia 10/09/2015   Allergic rhinitis 04/18/2013   Essential hypertension 12/03/2008      Review of Systems  All other systems reviewed and are negative.     Objective:     BP 112/80 (BP Location: Left Arm, Patient Position: Sitting, Cuff Size: Large)   Pulse 94   Temp 98.4 F (36.9 C) (Oral)   Ht '5\' 4"'$  (1.626 m)   Wt 178 lb 1.6 oz (80.8 kg)   LMP 07/29/2011   SpO2 98%   BMI 30.57 kg/m    Physical Exam Vitals reviewed.  Constitutional:      Appearance: Normal appearance. She is well-groomed and normal weight.  Eyes:     Conjunctiva/sclera: Conjunctivae normal.  Cardiovascular:     Rate and Rhythm: Normal rate and regular rhythm.     Pulses: Normal pulses.          Dorsalis pedis pulses are 2+ on the right side and 2+ on the left side.       Posterior tibial pulses are 2+ on the right side and 2+ on the left side.     Heart sounds: S1 normal and S2 normal.  Pulmonary:     Effort: Pulmonary effort is normal.     Breath sounds: Normal breath  sounds and air entry.  Abdominal:     General: Bowel sounds are normal.  Musculoskeletal:     Right lower leg: No edema.     Left lower leg: No edema.  Feet:     Right foot:     Protective Sensation: 4 sites tested.  4 sites sensed.     Skin integrity: Skin integrity normal.     Left foot:     Protective Sensation: 4 sites tested.  4 sites sensed.     Skin integrity: Skin integrity normal.  Neurological:     Mental Status: She is alert and oriented to person, place, and time. Mental status is at baseline.     Gait: Gait is intact.  Psychiatric:        Mood and Affect: Mood and affect normal.        Speech: Speech normal.        Behavior: Behavior normal.        Judgment: Judgment normal.      Results for orders placed or performed in visit on  04/19/22  POC HgB A1c  Result Value Ref Range   Hemoglobin A1C 6.9 (A) 4.0 - 5.6 %   HbA1c POC (<> result, manual entry)     HbA1c, POC (prediabetic range)     HbA1c, POC (controlled diabetic range)      Last hemoglobin A1c Lab Results  Component Value Date   HGBA1C 6.9 (A) 04/19/2022      The 10-year ASCVD risk score (Arnett DK, et al., 2019) is: 11.6%    Assessment & Plan:   Problem List Items Addressed This Visit       Endocrine   Diabetes mellitus type II, non insulin dependent ([place]) - Primary    Recurrent vaginal yeast infections with the jardiance. Will stop this medication and increase her metformin to 1000 mg BID XR, also will increase her Rybelsus to 14 mg daily. Rx's sent to the pharmacy. RTC in 6 months      Relevant Medications   Semaglutide (RYBELSUS) 14 MG TABS   metFORMIN (GLUMETZA) 1000 MG (MOD) 24 hr tablet   Other Relevant Orders   POC HgB A1c (Completed)     Genitourinary   Vaginal candidiasis    Will refill her diflucan rx. Will also give clotrimazole cream to use BID externally for relief. Hopefully stopping the jardiance will improve the recurrence issue.       Relevant Medications   clotrimazole ([facility] CLOTRIMAZOLE) 1 % cream   fluconazole (DIFLUCAN) 150 MG tablet    Return in about 6 months (around 10/18/2022) for Follow up DM.    Farrel Conners, MD

## 2022-04-19 NOTE — Assessment & Plan Note (Signed)
Will refill her diflucan rx. Will also give clotrimazole cream to use BID externally for relief. Hopefully stopping the jardiance will improve the recurrence issue.

## 2022-04-19 NOTE — Assessment & Plan Note (Addendum)
Recurrent vaginal yeast infections with the jardiance. Will stop this medication and increase her metformin to 1000 mg BID XR, also will increase her Rybelsus to 14 mg daily. Rx's sent to the pharmacy. RTC in 6 months

## 2022-06-29 DIAGNOSIS — H00022 Hordeolum internum right lower eyelid: Secondary | ICD-10-CM | POA: Diagnosis not present

## 2022-07-07 ENCOUNTER — Other Ambulatory Visit: Payer: Self-pay | Admitting: Family Medicine

## 2022-07-12 ENCOUNTER — Encounter: Payer: Self-pay | Admitting: Family Medicine

## 2022-07-12 ENCOUNTER — Ambulatory Visit (INDEPENDENT_AMBULATORY_CARE_PROVIDER_SITE_OTHER): Payer: Medicare Other | Admitting: Family Medicine

## 2022-07-12 VITALS — BP 140/80 | HR 99 | Temp 98.9°F | Ht 64.0 in | Wt 178.4 lb

## 2022-07-12 DIAGNOSIS — Z780 Asymptomatic menopausal state: Secondary | ICD-10-CM | POA: Diagnosis not present

## 2022-07-12 DIAGNOSIS — Z1231 Encounter for screening mammogram for malignant neoplasm of breast: Secondary | ICD-10-CM | POA: Diagnosis not present

## 2022-07-12 DIAGNOSIS — E119 Type 2 diabetes mellitus without complications: Secondary | ICD-10-CM

## 2022-07-12 LAB — POCT GLYCOSYLATED HEMOGLOBIN (HGB A1C): Hemoglobin A1C: 6.7 % — AB (ref 4.0–5.6)

## 2022-07-12 MED ORDER — FREESTYLE LIBRE 14 DAY SENSOR MISC: 1.0000 | 11 refills | Status: DC

## 2022-07-12 NOTE — Progress Notes (Unsigned)
Established Patient Office Visit  Subjective   Patient ID: Hayley Fox, female    DOB: 1956/01/04  Age: 67 y.o. MRN: [id number]  Chief Complaint  Patient presents with   Medication Refill    Patient requests a refill on the [facility] (previously given by Dr Ethlyn Gallery) and agrees to pay cash for the Rx as she is aware this is denied due to not using insulin    Patient is here to discuss getting the freestyle libre sensors. She reports that she really likes the sensors, states that her blood sugars were doing well when she had the sensors. Pt reports she is doing much better off of the jardiance, her vaginal itching has completely resolved. She continues to take the 14 mg rybelsus daily and the 750 metformin XR twice a day. A1C today in office is 6.7 which is better than previous value.   BP is slightly elevated today in office. She reports that she usually takes her BP medication around noon, she reports compliance with her medications. States that at home her BP is usually normal.   Current Outpatient Medications  Medication Instructions   albuterol (VENTOLIN HFA) 108 (90 Base) MCG/ACT inhaler TAKE 2 PUFFS BY MOUTH EVERY 6 HOURS AS NEEDED FOR WHEEZE OR SHORTNESS OF BREATH   amLODipine (NORVASC) 10 mg, Oral, Daily   clotrimazole ([employer] CLOTRIMAZOLE) 1 % cream 1 Application, Topical, 2 times daily   Continuous Blood Gluc Sensor (FREESTYLE LIBRE 14 DAY SENSOR) MISC 1 each, Does not apply, Every 14 days   fluconazole (DIFLUCAN) 150 mg, Oral, every 72 hours   fluticasone (FLONASE) 50 MCG/ACT nasal spray SPRAY 1 SPRAY INTO BOTH NOSTRILS DAILY.   lidocaine (LIDODERM) 5 % 1 patch, Transdermal, Every 24 hours, Remove & Discard patch within 12 hours or as directed by MD   losartan (COZAAR) 50 mg, Oral, Daily   metFORMIN (GLUCOPHAGE-XR) 750 mg, Oral, 2 times daily, TAKE 1 TABLET(500 MG) BY MOUTH TWICE DAILY WITH A MEAL   OVER THE COUNTER MEDICATION Benefactor supplement   rosuvastatin (CRESTOR)  10 mg, Oral, Daily   Rybelsus 14 mg, Oral, Daily    Patient Active Problem List   Diagnosis Date Noted   Uncontrolled type 2 diabetes mellitus with hyperglycemia ([place]) 01/12/2022   Vaginal candidiasis 01/12/2022   Fatigue 01/12/2022   Pain of left sacroiliac joint 01/20/2021   Diabetes mellitus type II, non insulin dependent ([place]) 10/09/2015   Hyperlipemia 10/09/2015   Allergic rhinitis 04/18/2013   Essential hypertension 12/03/2008      Review of Systems  All other systems reviewed and are negative.     Objective:     BP (!) 140/80 (BP Location: Left Arm, Patient Position: Sitting, Cuff Size: Normal)   Pulse 99   Temp 98.9 F (37.2 C) (Oral)   Ht '5\' 4"'$  (1.626 m)   Wt 178 lb 6.4 oz (80.9 kg)   LMP 07/29/2011   SpO2 100%   BMI 30.62 kg/m  BP Readings from Last 3 Encounters:  07/12/22 (!) 140/80  04/19/22 112/80  01/31/22 132/83      Physical Exam Vitals reviewed.  Constitutional:      Appearance: Normal appearance. She is well-groomed and normal weight.  Eyes:     Conjunctiva/sclera: Conjunctivae normal.  Cardiovascular:     Rate and Rhythm: Normal rate and regular rhythm.     Pulses: Normal pulses.     Heart sounds: S1 normal and S2 normal.  Pulmonary:     Effort: Pulmonary  effort is normal.     Breath sounds: Normal breath sounds and air entry.  Neurological:     Mental Status: She is alert and oriented to person, place, and time. Mental status is at baseline.     Gait: Gait is intact.  Psychiatric:        Mood and Affect: Mood and affect normal.        Speech: Speech normal.        Behavior: Behavior normal.        Judgment: Judgment normal.      Results for orders placed or performed in visit on 07/12/22  POC HgB A1c  Result Value Ref Range   Hemoglobin A1C 6.7 (A) 4.0 - 5.6 %   HbA1c POC (<> result, manual entry)     HbA1c, POC (prediabetic range)     HbA1c, POC (controlled diabetic range)        The 10-year ASCVD risk score (Arnett DK,  et al., 2019) is: 18.8%    Assessment & Plan:   Problem List Items Addressed This Visit       Unprioritized   Diabetes mellitus type II, non insulin dependent ([place]) - Primary    A1C is well controlled with the 14 mg daily  rybelsus and 750 mg BID of metformin. Will send in an order for the freestyle CGM's and will see her back in 6 months for follow up.      Relevant Medications   Continuous Blood Gluc Sensor (FREESTYLE LIBRE 14 DAY SENSOR) MISC   Other Relevant Orders   POC HgB A1c (Completed)   Other Visit Diagnoses     Postmenopausal state       Relevant Orders   DG Bone Density   Encounter for screening mammogram for malignant neoplasm of breast       Relevant Orders   MM Digital Screening       Return in about 6 months (around 01/10/2023).    Farrel Conners, MD

## 2022-07-13 DIAGNOSIS — H00022 Hordeolum internum right lower eyelid: Secondary | ICD-10-CM | POA: Diagnosis not present

## 2022-07-13 NOTE — Assessment & Plan Note (Signed)
A1C is well controlled with the 14 mg daily  rybelsus and 750 mg BID of metformin. Will send in an order for the freestyle CGM's and will see her back in 6 months for follow up.

## 2022-07-30 DIAGNOSIS — H00024 Hordeolum internum left upper eyelid: Secondary | ICD-10-CM | POA: Diagnosis not present

## 2022-08-09 DIAGNOSIS — H538 Other visual disturbances: Secondary | ICD-10-CM | POA: Diagnosis not present

## 2022-08-09 DIAGNOSIS — H00024 Hordeolum internum left upper eyelid: Secondary | ICD-10-CM | POA: Diagnosis not present

## 2022-10-11 ENCOUNTER — Telehealth: Payer: Self-pay | Admitting: Family Medicine

## 2022-10-11 NOTE — Telephone Encounter (Signed)
Called pt, no answer. Left msg for pt to call back to r/s appt due to provider being out of the office.  

## 2022-10-15 ENCOUNTER — Other Ambulatory Visit: Payer: Self-pay | Admitting: Family Medicine

## 2022-10-15 DIAGNOSIS — E785 Hyperlipidemia, unspecified: Secondary | ICD-10-CM

## 2022-10-18 ENCOUNTER — Ambulatory Visit: Payer: Medicare Other | Admitting: Family Medicine

## 2022-11-07 ENCOUNTER — Ambulatory Visit (INDEPENDENT_AMBULATORY_CARE_PROVIDER_SITE_OTHER): Payer: Medicare Other | Admitting: Family Medicine

## 2022-11-07 ENCOUNTER — Ambulatory Visit (INDEPENDENT_AMBULATORY_CARE_PROVIDER_SITE_OTHER): Payer: Medicare Other

## 2022-11-07 ENCOUNTER — Encounter: Payer: Self-pay | Admitting: Family Medicine

## 2022-11-07 VITALS — Ht 64.0 in | Wt 178.0 lb

## 2022-11-07 VITALS — BP 108/72 | HR 45 | Temp 98.5°F | Ht 64.0 in | Wt 171.9 lb

## 2022-11-07 DIAGNOSIS — E119 Type 2 diabetes mellitus without complications: Secondary | ICD-10-CM | POA: Diagnosis not present

## 2022-11-07 DIAGNOSIS — I10 Essential (primary) hypertension: Secondary | ICD-10-CM | POA: Diagnosis not present

## 2022-11-07 DIAGNOSIS — I493 Ventricular premature depolarization: Secondary | ICD-10-CM | POA: Diagnosis not present

## 2022-11-07 DIAGNOSIS — Z7984 Long term (current) use of oral hypoglycemic drugs: Secondary | ICD-10-CM | POA: Diagnosis not present

## 2022-11-07 DIAGNOSIS — Z0000 Encounter for general adult medical examination without abnormal findings: Secondary | ICD-10-CM | POA: Diagnosis not present

## 2022-11-07 DIAGNOSIS — I499 Cardiac arrhythmia, unspecified: Secondary | ICD-10-CM

## 2022-11-07 LAB — POCT GLYCOSYLATED HEMOGLOBIN (HGB A1C): Hemoglobin A1C: 6.7 % — AB (ref 4.0–5.6)

## 2022-11-07 MED ORDER — AMLODIPINE BESYLATE 10 MG PO TABS: 10.0000 mg | ORAL_TABLET | Freq: Every day | ORAL | 1 refills | Status: DC

## 2022-11-07 MED ORDER — FREESTYLE LIBRE 14 DAY SENSOR MISC: 1.0000 | 11 refills | Status: DC

## 2022-11-07 NOTE — Patient Instructions (Addendum)
The [facility]-- [phone] to get your mammogram and DEXA scan

## 2022-11-07 NOTE — Assessment & Plan Note (Signed)
Current hypertension medications:       Sig   losartan (COZAAR) 50 MG tablet (Taking) TAKE 1 TABLET BY MOUTH EVERY DAY   amLODipine (NORVASC) 10 MG tablet Take 1 tablet (10 mg total) by mouth daily.      BP is well controlled today, no new symptoms. Continue above medications as prescribed.

## 2022-11-07 NOTE — Assessment & Plan Note (Signed)
A1C performed in office today and is 6.7, well controlled, will continue metoformin 750 mg BID and rybelsus 14 mg daily. Refilled her CGM rx today

## 2022-11-07 NOTE — Patient Instructions (Addendum)
Hayley Fox , Thank you for taking time to come for your Medicare Wellness Visit. I appreciate your ongoing commitment to your health goals. Please review the following plan we discussed and let me know if I can assist you in the future.   These are the goals we discussed:  Goals       Get out of debt (pt-stated)      I want to get completely out of debt.      Lose weight (pt-stated)        This is a list of the screening recommended for you and due dates:  Health Maintenance  Topic Date Due   DEXA scan (bone density measurement)  Never done   DTaP/Tdap/Td vaccine (2 - Td or Tdap) 08/28/2021   Mammogram  08/03/2022   Eye exam for diabetics  10/15/2022   Zoster (Shingles) Vaccine (1 of 2) 02/07/2023*   Pneumonia Vaccine (1 of 1 - PCV) 11/07/2023*   Hemoglobin A1C  01/10/2023   Yearly kidney health urinalysis for diabetes  01/14/2023   Flu Shot  01/19/2023   Yearly kidney function blood test for diabetes  02/01/2023   Complete foot exam   04/20/2023   Medicare Annual Wellness Visit  11/07/2023   Colon Cancer Screening  12/20/2026   Hepatitis C Screening: USPSTF Recommendation to screen - Ages 18-79 yo.  Completed   HPV Vaccine  Aged Out   COVID-19 Vaccine  Discontinued  *Topic was postponed. The date shown is not the original due date.    Advanced directives: Advance directive discussed with you today. Even though you declined this today, please call our office should you change your mind, and we can give you the proper paperwork for you to fill out.   Conditions/risks identified: None  Next appointment: Follow up in one year for your annual wellness visit     Preventive Care 65 Years and Older, Female Preventive care refers to lifestyle choices and visits with your health care provider that can promote health and wellness. What does preventive care include? A yearly physical exam. This is also called an annual well check. Dental exams once or twice a year. Routine eye  exams. Ask your health care provider how often you should have your eyes checked. Personal lifestyle choices, including: Daily care of your teeth and gums. Regular physical activity. Eating a healthy diet. Avoiding tobacco and drug use. Limiting alcohol use. Practicing safe sex. Taking low-dose aspirin every day. Taking vitamin and mineral supplements as recommended by your health care provider. What happens during an annual well check? The services and screenings done by your health care provider during your annual well check will depend on your age, overall health, lifestyle risk factors, and family history of disease. Counseling  Your health care provider may ask you questions about your: Alcohol use. Tobacco use. Drug use. Emotional well-being. Home and relationship well-being. Sexual activity. Eating habits. History of falls. Memory and ability to understand (cognition). Work and work Astronomer. Reproductive health. Screening  You may have the following tests or measurements: Height, weight, and BMI. Blood pressure. Lipid and cholesterol levels. These may be checked every 5 years, or more frequently if you are over 61 years old. Skin check. Lung cancer screening. You may have this screening every year starting at age 73 if you have a 30-pack-year history of smoking and currently smoke or have quit within the past 15 years. Fecal occult blood test (FOBT) of the stool. You may have this test  every year starting at age 81. Flexible sigmoidoscopy or colonoscopy. You may have a sigmoidoscopy every 5 years or a colonoscopy every 10 years starting at age 32. Hepatitis C blood test. Hepatitis B blood test. Sexually transmitted disease (STD) testing. Diabetes screening. This is done by checking your blood sugar (glucose) after you have not eaten for a while (fasting). You may have this done every 1-3 years. Bone density scan. This is done to screen for osteoporosis. You may have  this done starting at age 34. Mammogram. This may be done every 1-2 years. Talk to your health care provider about how often you should have regular mammograms. Talk with your health care provider about your test results, treatment options, and if necessary, the need for more tests. Vaccines  Your health care provider may recommend certain vaccines, such as: Influenza vaccine. This is recommended every year. Tetanus, diphtheria, and acellular pertussis (Tdap, Td) vaccine. You may need a Td booster every 10 years. Zoster vaccine. You may need this after age 25. Pneumococcal 13-valent conjugate (PCV13) vaccine. One dose is recommended after age 44. Pneumococcal polysaccharide (PPSV23) vaccine. One dose is recommended after age 54. Talk to your health care provider about which screenings and vaccines you need and how often you need them. This information is not intended to replace advice given to you by your health care provider. Make sure you discuss any questions you have with your health care provider. Document Released: 07/03/2015 Document Revised: 02/24/2016 Document Reviewed: 04/07/2015 Elsevier Interactive Patient Education  2017 [employer].  Fall Prevention in the Home Falls can cause injuries. They can happen to people of all ages. There are many things you can do to make your home safe and to help prevent falls. What can I do on the outside of my home? Regularly fix the edges of walkways and driveways and fix any cracks. Remove anything that might make you trip as you walk through a door, such as a raised step or threshold. Trim any bushes or trees on the path to your home. Use bright outdoor lighting. Clear any walking paths of anything that might make someone trip, such as rocks or tools. Regularly check to see if handrails are loose or broken. Make sure that both sides of any steps have handrails. Any raised decks and porches should have guardrails on the edges. Have any leaves,  snow, or ice cleared regularly. Use sand or salt on walking paths during winter. Clean up any spills in your garage right away. This includes oil or grease spills. What can I do in the bathroom? Use night lights. Install grab bars by the toilet and in the tub and shower. Do not use towel bars as grab bars. Use non-skid mats or decals in the tub or shower. If you need to sit down in the shower, use a plastic, non-slip stool. Keep the floor dry. Clean up any water that spills on the floor as soon as it happens. Remove soap buildup in the tub or shower regularly. Attach bath mats securely with double-sided non-slip rug tape. Do not have throw rugs and other things on the floor that can make you trip. What can I do in the bedroom? Use night lights. Make sure that you have a light by your bed that is easy to reach. Do not use any sheets or blankets that are too big for your bed. They should not hang down onto the floor. Have a firm chair that has side arms. You can use  this for support while you get dressed. Do not have throw rugs and other things on the floor that can make you trip. What can I do in the kitchen? Clean up any spills right away. Avoid walking on wet floors. Keep items that you use a lot in easy-to-reach places. If you need to reach something above you, use a strong step stool that has a grab bar. Keep electrical cords out of the way. Do not use floor polish or wax that makes floors slippery. If you must use wax, use non-skid floor wax. Do not have throw rugs and other things on the floor that can make you trip. What can I do with my stairs? Do not leave any items on the stairs. Make sure that there are handrails on both sides of the stairs and use them. Fix handrails that are broken or loose. Make sure that handrails are as long as the stairways. Check any carpeting to make sure that it is firmly attached to the stairs. Fix any carpet that is loose or worn. Avoid having throw  rugs at the top or bottom of the stairs. If you do have throw rugs, attach them to the floor with carpet tape. Make sure that you have a light switch at the top of the stairs and the bottom of the stairs. If you do not have them, ask someone to add them for you. What else can I do to help prevent falls? Wear shoes that: Do not have high heels. Have rubber bottoms. Are comfortable and fit you well. Are closed at the toe. Do not wear sandals. If you use a stepladder: Make sure that it is fully opened. Do not climb a closed stepladder. Make sure that both sides of the stepladder are locked into place. Ask someone to hold it for you, if possible. Clearly mark and make sure that you can see: Any grab bars or handrails. First and last steps. Where the edge of each step is. Use tools that help you move around (mobility aids) if they are needed. These include: Canes. Walkers. Scooters. Crutches. Turn on the lights when you go into a dark area. Replace any light bulbs as soon as they burn out. Set up your furniture so you have a clear path. Avoid moving your furniture around. If any of your floors are uneven, fix them. If there are any pets around you, be aware of where they are. Review your medicines with your doctor. Some medicines can make you feel dizzy. This can increase your chance of falling. Ask your doctor what other things that you can do to help prevent falls. This information is not intended to replace advice given to you by your health care provider. Make sure you discuss any questions you have with your health care provider. Document Released: 04/02/2009 Document Revised: 11/12/2015 Document Reviewed: 07/11/2014 Elsevier Interactive Patient Education  2017 [employer].

## 2022-11-07 NOTE — Progress Notes (Signed)
Subjective:   Hayley Fox is a 67 y.o. female who presents for Medicare Annual (Subsequent) preventive examination.  Review of Systems    Virtual Visit via Telephone Note  I connected with  [name] on 11/07/22 at  8:45 AM EDT by telephone and verified that I am speaking with the correct person using two identifiers.  Location: Patient: Home Provider: Office Persons participating in the virtual visit: patient/Nurse Health Advisor   I discussed the limitations, risks, security and privacy concerns of performing an evaluation and management service by telephone and the availability of in person appointments. The patient expressed understanding and agreed to proceed.  Interactive audio and video telecommunications were attempted between this nurse and patient, however failed, due to patient having technical difficulties OR patient did not have access to video capability.  We continued and completed visit with audio only.  Some vital signs may be absent or patient reported.   [name], LPN  Cardiac Risk Factors include: advanced age (>52men, >27 women);diabetes mellitus;hypertension     Objective:    Today's Vitals   11/07/22 0912  Weight: 178 lb (80.7 kg)  Height: 5\' 4"  (1.626 m)   Body mass index is 30.55 kg/m.     11/07/2022    9:21 AM 01/31/2022    7:49 AM 11/03/2021    9:18 AM 04/20/2018    1:15 PM 12/05/2016    1:29 PM 10/02/2015   10:24 AM  Advanced Directives  Does Patient Have a Medical Advance Directive? No No No No No No  Would patient like information on creating a medical advance directive? No - Patient declined  No - Patient declined       Current Medications (verified) Outpatient Encounter Medications as of 11/07/2022  Medication Sig   albuterol (VENTOLIN HFA) 108 (90 Base) MCG/ACT inhaler TAKE 2 PUFFS BY MOUTH EVERY 6 HOURS AS NEEDED FOR WHEEZE OR SHORTNESS OF BREATH   amLODipine (NORVASC) 10 MG tablet TAKE 1 TABLET BY MOUTH EVERY DAY    clotrimazole ([employer] CLOTRIMAZOLE) 1 % cream Apply 1 Application topically 2 (two) times daily.   Continuous Blood Gluc Sensor (FREESTYLE LIBRE 14 DAY SENSOR) MISC 1 each by Does not apply route every 14 (fourteen) days.   fluconazole (DIFLUCAN) 150 MG tablet Take 1 tablet (150 mg total) by mouth every 3 (three) days.   fluticasone (FLONASE) 50 MCG/ACT nasal spray SPRAY 1 SPRAY INTO BOTH NOSTRILS DAILY.   lidocaine (LIDODERM) 5 % Place 1 patch onto the skin daily. Remove & Discard patch within 12 hours or as directed by MD   losartan (COZAAR) 50 MG tablet TAKE 1 TABLET BY MOUTH EVERY DAY   metFORMIN (GLUCOPHAGE-XR) 750 MG 24 hr tablet Take 1 tablet (750 mg total) by mouth in the morning and at bedtime. TAKE 1 TABLET(500 MG) BY MOUTH TWICE DAILY WITH A MEAL   OVER THE COUNTER MEDICATION Benefactor supplement   rosuvastatin (CRESTOR) 10 MG tablet TAKE 1 TABLET BY MOUTH EVERY DAY   Semaglutide (RYBELSUS) 14 MG TABS Take 1 tablet (14 mg total) by mouth daily.   Facility-Administered Encounter Medications as of 11/07/2022  Medication   0.9 %  sodium chloride infusion    Allergies (verified) Patient has no known allergies.   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   Qualifier: Diagnosis of  By: Tawanna Cooler MD, Eugenio Hoes    Past Surgical History:  Procedure Laterality Date   CESAREAN SECTION     COLONOSCOPY  12/2016   negative screening   Family History  Problem Relation Age of Onset   Diabetes Father    Hypertension Father    Hypertension Mother    Alcohol abuse Mother    Cirrhosis Mother    Diabetes Brother    High blood pressure Brother    High Cholesterol Brother    Colon cancer Neg Hx    Social History   Socioeconomic History   Marital  status: Single    Spouse name: Not on file   Number of children: Not on file   Years of education: Not on file   Highest education level: Not on file  Occupational History   Not on file  Tobacco Use   Smoking status: Former    Years: 1    Types: Cigarettes   Smokeless tobacco: Never  Vaping Use   Vaping Use: Never used  Substance and Sexual Activity   Alcohol use: Yes    Comment: socially-once a month per pt.   Drug use: No   Sexual activity: Not Currently    Birth control/protection: None  Other Topics Concern   Not on file  Social History Narrative   Work or School: live in caregiver      Home Situation: lives with client several days per week      Spiritual Beliefs: catholic      Lifestyle: exercises 2 days per week; diet is pretty healthy      Social Determinants of Health   Financial Resource Strain: Low Risk  (11/07/2022)   Overall Financial Resource Strain (CARDIA)    Difficulty of Paying Living Expenses: Not hard at all  Food Insecurity: No Food Insecurity (11/07/2022)   Hunger Vital Sign    Worried About Running Out of Food in the Last Year: Never true    Ran Out of Food in the Last Year: Never true  Transportation Needs: No Transportation Needs (11/07/2022)   PRAPARE - Administrator, Civil Service (Medical): No    Lack of Transportation (Non-Medical): No  Physical Activity: Inactive (11/07/2022)   Exercise Vital Sign    Days of Exercise per Week: 0 days    Minutes of Exercise per Session: 0 min  Stress: No Stress Concern Present (11/07/2022)   [employer] of Occupational Health - Occupational Stress Questionnaire    Feeling of Stress : Not at all  Social Connections: Moderately Integrated (11/07/2022)   Social Connection and Isolation Panel [NHANES]    Frequency of Communication with Friends and Family: More than three times a week    Frequency of Social Gatherings with Friends and Family: More than three times a week    Attends Religious  Services: More than 4 times per year    Active Member of [employer] or Organizations: Yes    Attends Engineer, structural: More than 4 times per year    Marital Status: Never married    Tobacco Counseling Counseling given: Not Answered   Clinical Intake:  Pre-visit preparation completed: No  Pain : No/denies pain   Nutrition Risk Assessment:  Has the patient had any N/V/D within the last 2 months?  No  Does the patient have any non-healing wounds?  No  Has the patient had any  unintentional weight loss or weight gain?  No   Diabetes:  Is the patient diabetic?  Yes  If diabetic, was a CBG obtained today?  No  Did the patient bring in their glucometer from home?  No  How often do you monitor your CBG's? Daily/Monitor.   Financial Strains and Diabetes Management:  Are you having any financial strains with the device, your supplies or your medication? No .  Does the patient want to be seen by Chronic Care Management for management of their diabetes?  No  Would the patient like to be referred to a Nutritionist or for Diabetic Management?  No   Diabetic Exams:  Diabetic Eye Exam: Completed . Overdue for diabetic eye exam. Pt has been advised about the importance in completing this exam. A referral has been placed today. Message sent to referral coordinator for scheduling purposes. Advised pt to expect a call from office referred to regarding appt.  Diabetic Foot Exam: Completed . Pt has been advised about the importance in completing this exam. Pt is scheduled for diabetic foot exam on Followed by PCP.    BMI - recorded: 30.55 Nutritional Status: BMI > 30  Obese Nutritional Risks: None Diabetes: Yes CBG done?: No Did pt. bring in CBG monitor from home?: No  How often do you need to have someone help you when you read instructions, pamphlets, or other written materials from your doctor or pharmacy?: 1 - Never  Diabetic?  Yes  Interpreter Needed?: No  Information  entered by :: Theresa Mulligan LPN   Activities of Daily Living    11/07/2022    9:20 AM  In your present state of health, do you have any difficulty performing the following activities:  Hearing? 0  Vision? 0  Difficulty concentrating or making decisions? 0  Walking or climbing stairs? 0  Dressing or bathing? 0  Doing errands, shopping? 0  Preparing Food and eating ? N  Using the Toilet? N  In the past six months, have you accidently leaked urine? N  Do you have problems with loss of bowel control? N  Managing your Medications? N  Managing your Finances? N  Housekeeping or managing your Housekeeping? N    Patient Care Team: Karie Georges, MD as PCP - General (Family Medicine) [place], Heather, OD as Consulting Physician (Optometry)  Indicate any recent Medical Services you may have received from other than [facility] providers in the past year (date may be approximate).     Assessment:   This is a routine wellness examination for Hayley Fox.  Hearing/Vision screen Hearing Screening - Comments:: Denies hearing difficulties   Vision Screening - Comments:: Wears rx glasses - up to date with routine eye exams with [facility]   Dietary issues and exercise activities discussed: Exercise limited by: None identified   Goals Addressed               This Visit's Progress     Lose weight (pt-stated)         Depression Screen    11/07/2022    9:17 AM 07/12/2022    8:21 AM 01/12/2022    9:14 AM 11/03/2021    9:13 AM 10/16/2020    9:07 AM 04/20/2018    1:14 PM  PHQ 2/9 Scores  PHQ - 2 Score 0 0 4 0 0 0  PHQ- 9 Score  0 17       Fall Risk    11/07/2022    9:20 AM 07/12/2022  8:22 AM 11/03/2021    9:17 AM 04/20/2018    1:14 PM  Fall Risk   Falls in the past year? 0 0 0 1  Number falls in past yr: 0 0 0 0  Injury with Fall? 0 0 0 1  Risk for fall due to : No Fall Risks No Fall Risks No Fall Risks   Follow up Falls prevention discussed Falls evaluation completed       FALL RISK PREVENTION PERTAINING TO THE HOME:  Any stairs in or around the home? Yes  If so, are there any without handrails? No  Home free of loose throw rugs in walkways, pet beds, electrical cords, etc? Yes  Adequate lighting in your home to reduce risk of falls? Yes   ASSISTIVE DEVICES UTILIZED TO PREVENT FALLS:  Life alert? No  Use of a cane, walker or w/c? No  Grab bars in the bathroom? No  Shower chair or bench in shower? No  Elevated toilet seat or a handicapped toilet? No   TIMED UP AND GO:  Was the test performed? No . Audio Visit  Cognitive Function:        11/07/2022    9:21 AM 11/03/2021    9:19 AM  6CIT Screen  What Year? 0 points 0 points  What month? 0 points 0 points  What time? 0 points 0 points  Count back from 20 0 points 0 points  Months in reverse 0 points 0 points  Repeat phrase 0 points 0 points  Total Score 0 points 0 points    Immunizations Immunization History  Administered Date(s) Administered   PFIZER(Purple Top)SARS-COV-2 Vaccination 08/29/2019, 09/24/2019   PPD Test 03/05/2014, 02/13/2015   Tdap 08/29/2011    TDAP status: Due, Education has been provided regarding the importance of this vaccine. Advised may receive this vaccine at local pharmacy or Health Dept. Aware to provide a copy of the vaccination record if obtained from local pharmacy or Health Dept. Verbalized acceptance and understanding.  Flu Vaccine status: Up to date  Pneumococcal vaccine status: Due, Education has been provided regarding the importance of this vaccine. Advised may receive this vaccine at local pharmacy or Health Dept. Aware to provide a copy of the vaccination record if obtained from local pharmacy or Health Dept. Verbalized acceptance and understanding.  Covid-19 vaccine status: Completed vaccines  Qualifies for Shingles Vaccine? Yes   Zostavax completed No   Shingrix Completed?: No.    Education has been provided regarding the importance of this  vaccine. Patient has been advised to call insurance company to determine out of pocket expense if they have not yet received this vaccine. Advised may also receive vaccine at local pharmacy or Health Dept. Verbalized acceptance and understanding.  Screening Tests Health Maintenance  Topic Date Due   DEXA SCAN  Never done   DTaP/Tdap/Td (2 - Td or Tdap) 08/28/2021   MAMMOGRAM  08/03/2022   OPHTHALMOLOGY EXAM  10/15/2022   Zoster Vaccines- Shingrix (1 of 2) 02/07/2023 (Originally 10/31/2005)   Pneumonia Vaccine 54+ Years old (1 of 1 - PCV) 11/07/2023 (Originally 10/31/2020)   HEMOGLOBIN A1C  01/10/2023   Diabetic kidney evaluation - Urine ACR  01/14/2023   INFLUENZA VACCINE  01/19/2023   Diabetic kidney evaluation - eGFR measurement  02/01/2023   FOOT EXAM  04/20/2023   Medicare Annual Wellness (AWV)  11/07/2023   COLONOSCOPY (Pts 45-33yrs Insurance coverage will need to be confirmed)  12/20/2026   Hepatitis C Screening  Completed  HPV VACCINES  Aged Out   COVID-19 Vaccine  Discontinued    Health Maintenance  Health Maintenance Due  Topic Date Due   DEXA SCAN  Never done   DTaP/Tdap/Td (2 - Td or Tdap) 08/28/2021   MAMMOGRAM  08/03/2022   OPHTHALMOLOGY EXAM  10/15/2022    Colorectal cancer screening: Type of screening: Colonoscopy. Completed 01/08/17. Repeat every 10 years  Mammogram status: Ordered 07/12/22. Pt provided with contact info and advised to call to schedule appt.   Bone Density status: Ordered 07/12/22. Pt provided with contact info and advised to call to schedule appt.  Lung Cancer Screening: (Low Dose CT Chest recommended if Age 34-80 years, 30 pack-year currently smoking OR have quit w/in 15years.)  qualify.     Additional Screening:  Hepatitis C Screening: does qualify; Completed 10/10/16  Vision Screening: Recommended annual ophthalmology exams for early detection of glaucoma and other disorders of the eye. Is the patient up to date with their annual eye  exam?  Yes  Who is the provider or what is the name of the office in which the patient attends annual eye exams? [facility] If pt is not established with a provider, would they like to be referred to a provider to establish care? No .   Dental Screening: Recommended annual dental exams for proper oral hygiene  Community Resource Referral / Chronic Care Management: CRR required this visit?  No   CCM required this visit?  No      Plan:     I have personally reviewed and noted the following in the patient's chart:   Medical and social history Use of alcohol, tobacco or illicit drugs  Current medications and supplements including opioid prescriptions. Patient is not currently taking opioid prescriptions. Functional ability and status Nutritional status Physical activity Advanced directives List of other physicians Hospitalizations, surgeries, and ER visits in previous 12 months Vitals Screenings to include cognitive, depression, and falls Referrals and appointments  In addition, I have reviewed and discussed with patient certain preventive protocols, quality metrics, and best practice recommendations. A written personalized care plan for preventive services as well as general preventive health recommendations were provided to patient.     [name], LPN   1/61/0960   Nurse Notes: None

## 2022-11-07 NOTE — Progress Notes (Signed)
Established Patient Office Visit  Subjective   Patient ID: Hayley Fox, female    DOB: 14-Jul-1955  Age: 67 y.o. MRN: [id number]  Chief Complaint  Patient presents with   Medical Management of Chronic Issues    Pt is here for follow up on DM today. She reports she is doing well at home, no new symptoms, states she has her eye exam scheduled for next month. She denies any blurry vision, no polyuria.  HTN -- BP in office performed and is well controlled. She  reports no side effects to the medications, no chest pain, SOB, dizziness or headaches. She has a BP cuff at home and is checking BP regularly, reports they are in the normal range.   HR was read as 45 today, however on exam her HR appears higher. She denies any SOB, no chest pain or headaches. EKG performed in office today and shows multiform ectopic ventricular beats, pt denies any symptoms, no lightheadedness, no dizziness, no fatigue.     Current Outpatient Medications  Medication Instructions   albuterol (VENTOLIN HFA) 108 (90 Base) MCG/ACT inhaler TAKE 2 PUFFS BY MOUTH EVERY 6 HOURS AS NEEDED FOR WHEEZE OR SHORTNESS OF BREATH   amLODipine (NORVASC) 10 mg, Oral, Daily   Continuous Glucose Sensor (FREESTYLE LIBRE 14 DAY SENSOR) MISC 1 each, Does not apply, Every 14 days   fluticasone (FLONASE) 50 MCG/ACT nasal spray SPRAY 1 SPRAY INTO BOTH NOSTRILS DAILY.   losartan (COZAAR) 50 mg, Oral, Daily   metFORMIN (GLUCOPHAGE-XR) 750 mg, Oral, 2 times daily, TAKE 1 TABLET(500 MG) BY MOUTH TWICE DAILY WITH A MEAL   OVER THE COUNTER MEDICATION Benefactor supplement   rosuvastatin (CRESTOR) 10 mg, Oral, Daily   Rybelsus 14 mg, Oral, Daily    Patient Active Problem List   Diagnosis Date Noted   Uncontrolled type 2 diabetes mellitus with hyperglycemia (HCC) 01/12/2022   Vaginal candidiasis 01/12/2022   Fatigue 01/12/2022   Pain of left sacroiliac joint 01/20/2021   Diabetes mellitus type II, non insulin dependent (HCC) 10/09/2015    Hyperlipemia 10/09/2015   Allergic rhinitis 04/18/2013   Essential hypertension 12/03/2008      Review of Systems  All other systems reviewed and are negative.     Objective:     BP 108/72 (BP Location: Left Arm, Patient Position: Sitting, Cuff Size: Normal)   Pulse (!) 45   Temp 98.5 F (36.9 C) (Oral)   Ht 5\' 4"  (1.626 m)   Wt 171 lb 14.4 oz (78 kg)   LMP 07/29/2011   SpO2 98%   BMI 29.51 kg/m    Physical Exam Vitals reviewed.  Constitutional:      Appearance: Normal appearance. She is well-groomed and normal weight.  Eyes:     Conjunctiva/sclera: Conjunctivae normal.  Neck:     Thyroid: No thyromegaly.  Cardiovascular:     Rate and Rhythm: Normal rate. Rhythm irregular.     Pulses: Normal pulses.     Heart sounds: S1 normal and S2 normal.  Pulmonary:     Effort: Pulmonary effort is normal.     Breath sounds: Normal breath sounds and air entry.  Abdominal:     General: Bowel sounds are normal.  Musculoskeletal:     Right lower leg: No edema.     Left lower leg: No edema.  Neurological:     Mental Status: She is alert and oriented to person, place, and time. Mental status is at baseline.     Gait: Gait  is intact.  Psychiatric:        Mood and Affect: Mood and affect normal.        Speech: Speech normal.        Behavior: Behavior normal.        Judgment: Judgment normal.   EKG interpretation note: Compared with previous EKG dated 02/02/2022. EKG today shows a rate of 90 with multiform ectopic ventricular beats, 4 beats seen in the 10 second rhythm strip. This is similar to previous EKG in 2023 but different than her EKG from 2015. No ST wave changes, normal P waves.     Results for orders placed or performed in visit on 11/07/22  POC HgB A1c  Result Value Ref Range   Hemoglobin A1C 6.7 (A) 4.0 - 5.6 %   HbA1c POC (<> result, manual entry)     HbA1c, POC (prediabetic range)     HbA1c, POC (controlled diabetic range)        The 10-year ASCVD risk  score (Arnett DK, et al., 2019) is: 11.4%    Assessment & Plan:  Irregular heart beat -     EKG 12-Lead -     Ambulatory referral to Cardiology  Diabetes mellitus type II, non insulin dependent (HCC) Assessment & Plan: A1C performed in office today and is 6.7, well controlled, will continue metoformin 750 mg BID and rybelsus 14 mg daily. Refilled her CGM rx today  Orders: -     POCT glycosylated hemoglobin (Hb A1C) -     FreeStyle Libre 14 Day Sensor; 1 each by Does not apply route every 14 (fourteen) days.  Dispense: 2 each; Refill: 11 -     amLODIPine Besylate; Take 1 tablet (10 mg total) by mouth daily.  Dispense: 90 tablet; Refill: 1  Essential hypertension Assessment & Plan: Current hypertension medications:       Sig   losartan (COZAAR) 50 MG tablet (Taking) TAKE 1 TABLET BY MOUTH EVERY DAY   amLODipine (NORVASC) 10 MG tablet Take 1 tablet (10 mg total) by mouth daily.      BP is well controlled today, no new symptoms. Continue above medications as prescribed.    Ectopic beat, ventricular -     Ambulatory referral to Cardiology EKG showed this in August 2023 also but not in 2015. I advised that she see the cardiologist for further recommendations and work up. She is not currently symptomatic from the ventricular beats but I still thought it would be better to have the evaluation to make sure there are no underlying causes since the beats are so frequent. This was explained to the patient and she is agreeable to the consult.     Return in about 6 months (around 05/10/2023) for DM, HTN.    Karie Georges, MD

## 2022-11-09 ENCOUNTER — Telehealth: Payer: Self-pay | Admitting: *Deleted

## 2022-11-09 NOTE — Telephone Encounter (Signed)
Patient was noted on a list given to me by clinical supervisor for lack of recent mammogram results.  I called the patient, she stated she has the number and will [facility].

## 2022-11-09 NOTE — Telephone Encounter (Signed)
Error

## 2022-11-15 ENCOUNTER — Telehealth: Payer: Self-pay

## 2022-11-15 NOTE — Telephone Encounter (Signed)
Pharmacy Patient Advocate Encounter  Received notification from [facility] that the request for prior authorization for [facility] has been denied due to .    Please be advised we currently do not have a Pharmacist to review denials, therefore you will need to process appeals accordingly as needed. Thanks for your support at this time.   You may call [phone] or fax [phone], to appeal.

## 2022-11-21 DIAGNOSIS — H18413 Arcus senilis, bilateral: Secondary | ICD-10-CM | POA: Diagnosis not present

## 2022-11-21 DIAGNOSIS — H04123 Dry eye syndrome of bilateral lacrimal glands: Secondary | ICD-10-CM | POA: Diagnosis not present

## 2022-11-21 DIAGNOSIS — E119 Type 2 diabetes mellitus without complications: Secondary | ICD-10-CM | POA: Diagnosis not present

## 2022-11-21 DIAGNOSIS — H2513 Age-related nuclear cataract, bilateral: Secondary | ICD-10-CM | POA: Diagnosis not present

## 2022-12-23 NOTE — Progress Notes (Signed)
Cardiology Office Note:   Date:  12/28/2022  NAME:  Hayley Fox    MRN: [id number] DOB:  06-16-1956   PCP:  Karie Georges, MD  Cardiologist:  None  Electrophysiologist:  None   Referring MD: Karie Georges, MD   Chief Complaint  Patient presents with   pvc    History of Present Illness:   Hayley Fox is a 67 y.o. female with a hx of DM, HTN, HLD who is being seen today for the evaluation of PVCs at the request of Karie Georges, MD. Seen by PCP with PVCs on EKG. she reports she has had palpitations on and off for the past 6 to 12 months.  Described as flutters.  Occur at least once per week.  Occurring more often today.  EKG with primary care physician showed PVCs.  She is having brief PVCs on examination today.  Lab work is a bit outdated.  Needs updated panel.  She reports no chest pains or trouble breathing.  She does work long hours as a Engineer, structural.  She tells me that her days are long.  She is not getting enough sleep.  She is drinking plenty of water but could do a little better.  She is diabetic but her A1c is controlled.  Lipids are at goal.  Denies any chest pains.  EKG for my review shows either PACs with aberrant conduction versus PVCs.  We did discuss obtaining an echocardiogram as well as a 7-day ZIO.  Given ongoing symptoms she likely just needs treatment.  She does not smoke.  Alcohol in moderation.  No drug use.  She is single.  She has several children, grandchildren and great-grandchildren.  CV examination unremarkable except for irregular heartbeat.  No murmurs on exam.  There is family history of heart disease in both parents.  Symptoms are ongoing.  Needs further evaluation.   Problem List HTN DM -A1c 6.7 3. 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   Continuous Glucose Sensor (FREESTYLE LIBRE 14 DAY SENSOR) MISC 1 each by Does not apply route every 14 (fourteen) days.   fluticasone (FLONASE) 50 MCG/ACT nasal spray SPRAY 1 SPRAY INTO BOTH NOSTRILS DAILY.   losartan (COZAAR) 50 MG tablet TAKE 1 TABLET BY MOUTH EVERY DAY   metFORMIN (GLUCOPHAGE-XR) 750 MG 24 hr tablet Take 1 tablet (750 mg total) by mouth in the morning and at bedtime. TAKE 1 TABLET(500 MG) BY MOUTH TWICE DAILY WITH A MEAL   metoprolol succinate (TOPROL XL) 25 MG 24 hr tablet Take 1 tablet (25 mg total) by mouth daily.   OVER THE COUNTER  MEDICATION Benefactor supplement   rosuvastatin (CRESTOR) 10 MG tablet TAKE 1 TABLET BY MOUTH EVERY DAY   Semaglutide (RYBELSUS) 14 MG TABS Take 1 tablet (14 mg total) by mouth daily.   Current Facility-Administered Medications for the 12/28/22 encounter (Office Visit) with Sande Rives, MD  Medication   0.9 %  sodium chloride infusion     Allergies:    Patient has no known allergies.   Social History: Social History   Socioeconomic History   Marital status: Single    Spouse name: Not on file   Number of children: 4   Years of education: Not on file   Highest education level: Not on file  Occupational History   Occupation: Care Giver  Tobacco Use   Smoking status: Former    Years: 1    Types: Cigarettes   Smokeless tobacco: Never  Vaping Use   Vaping Use: Never  used  Substance and Sexual Activity   Alcohol use: Yes    Comment: socially-once a month per pt.   Drug use: No   Sexual activity: Not Currently    Birth control/protection: None  Other Topics Concern   Not on file  Social History Narrative   Work or School: live in caregiver      Home Situation: lives with client several days per week      Spiritual Beliefs: catholic      Lifestyle: exercises 2 days per week; diet is pretty healthy      Social Determinants of Health   Financial Resource Strain: Low Risk  (11/07/2022)   Overall Financial Resource Strain (CARDIA)    Difficulty of Paying Living Expenses: Not hard at all  Food Insecurity: No Food Insecurity (11/07/2022)   Hunger Vital Sign    Worried About Running Out of Food in the Last Year: Never true    Ran Out of Food in the Last Year: Never true  Transportation Needs: No Transportation Needs (11/07/2022)   PRAPARE - Administrator, Civil Service (Medical): No    Lack of Transportation (Non-Medical): No  Physical Activity: Inactive (11/07/2022)   Exercise Vital Sign    Days of Exercise per Week: 0 days    Minutes of Exercise per Session: 0 min  Stress: No Stress Concern Present (11/07/2022)   [employer] of Occupational Health - Occupational Stress Questionnaire    Feeling of Stress : Not at all  Social Connections: Moderately Integrated (11/07/2022)   Social Connection and Isolation Panel [NHANES]    Frequency of Communication with Friends and Family: More than three times a week    Frequency of Social Gatherings with Friends and Family: More than three times a week    Attends Religious Services: More than 4 times per year    Active Member of [employer] or Organizations: Yes    Attends Engineer, structural: More than 4 times per year    Marital Status: Never married     Family History: The patient's family history includes Alcohol abuse in her mother; Cirrhosis in her mother; Diabetes in her brother  and father; High Cholesterol in her brother; High blood pressure in her brother; Hypertension in her father and mother. There is no history of Colon cancer.  ROS:   All other ROS reviewed and negative. Pertinent positives noted in the HPI.     EKGs/Labs/Other Studies Reviewed:   The following studies were personally reviewed by me today:  EKG:  EKG is not ordered today.  Recent Labs: 01/13/2022: TSH 1.83 01/31/2022: ALT 13; BUN 18; Creatinine, Ser 0.71; Hemoglobin 12.8; Magnesium 2.1; Platelets 255; Potassium 4.1; Sodium 140   Recent Lipid Panel    Component Value Date/Time   CHOL 133 01/13/2022 0721   TRIG 76.0 01/13/2022 0721   HDL 50.00 01/13/2022 0721   CHOLHDL 3 01/13/2022 0721   VLDL 15.2 01/13/2022 0721   LDLCALC 68 01/13/2022 0721    Physical Exam:   VS:  BP 132/82   Pulse 99   Ht 5\' 4"  (1.626 m)   Wt 173 lb (78.5 kg)   LMP 07/29/2011   SpO2 98%   BMI 29.70 kg/m    Wt Readings from Last 3 Encounters:  12/28/22 173 lb (78.5 kg)  11/07/22 171 lb 14.4 oz (78 kg)  11/07/22 178 lb (80.7 kg)    General: Well nourished, well developed, in no acute distress Head: Atraumatic, normal size  Eyes: PEERLA, EOMI  Neck: Supple, no JVD Endocrine: No thryomegaly Cardiac: Normal S1, S2; RRR; no murmurs, rubs, or gallops Lungs: Clear to auscultation bilaterally, no wheezing, rhonchi or rales  Abd: Soft, nontender, no hepatomegaly  Ext: No edema, pulses 2+ Musculoskeletal: No deformities, BUE and BLE strength normal and equal Skin: Warm and dry, no rashes   Neuro: Alert and oriented to person, place, time, and situation, CNII-XII grossly intact, no focal deficits  Psych: Normal mood and affect   ASSESSMENT:   Hayley Fox is a 67 y.o. female who presents for the following: 1. PVC (premature ventricular contraction)   2. Palpitations     PLAN:   1. PVC (premature ventricular contraction) 2. Palpitations -Ongoing symptoms of palpitations.  EKG with PVCs.  Having  PVCs on examination.  She needs a full panel of labs including TSH, CMP, CBC.  I would like to quantify her PVC burden with a 7-day Zio patch.  Given her symptoms from this we will proceed with metoprolol succinate 25 mg daily.  She also needs an echo.  We will then base further testing on her PVC burden.  She has no signs of heart failure today.  No symptoms of angina.  I have encouraged her to drink plenty of water and get proper sleep.  She will also watch her salt intake.  Her blood pressure is controlled today as well as lipids.  She will see me back in 3 to 4 months to discuss further.  Disposition: Return in about 3 months (around 03/30/2023).  Medication Adjustments/Labs and Tests Ordered: Current medicines are reviewed at length with the patient today.  Concerns regarding medicines are outlined above.  Orders Placed This Encounter  Procedures   CBC   Comprehensive metabolic panel   TSH   LONG TERM MONITOR (3-14 DAYS)   ECHOCARDIOGRAM COMPLETE   Meds ordered this encounter  Medications   metoprolol succinate (TOPROL XL) 25 MG 24 hr tablet    Sig: Take 1 tablet (25 mg total) by mouth daily.    Dispense:  90 tablet    Refill:  3   Patient Instructions  Medication Instructions:  START metoprolol succinate 25mg  daily  *If you need a refill on your cardiac medications before your next appointment, please call your pharmacy*   Lab Work: TSH, CBC, CMET today  If you have labs (blood work) drawn today and your tests are completely normal, you will receive your results only by: MyChart Message (if you have MyChart) OR A paper copy in the mail If you have any lab test  that is abnormal or we need to change your treatment, we will call you to review the results.   Testing/Procedures: Your physician has requested that you have an echocardiogram. Echocardiography is a painless test that uses sound waves to create images of your heart. It provides your doctor with information about the  size and shape of your heart and how well your heart's chambers and valves are working. This procedure takes approximately one hour. There are no restrictions for this procedure. Please do NOT wear cologne, perfume, aftershave, or lotions (deodorant is allowed). Please arrive 15 minutes prior to your appointment time.  ZIO XT- Long Term Monitor Instructions  Your physician has requested you wear a ZIO patch monitor for 7 days.  This is a single patch monitor. [employer] supplies one patch monitor per enrollment. Additional stickers are not available. Please do not apply patch if you will be having a Nuclear Stress Test,  Echocardiogram, Cardiac CT, MRI, or Chest Xray during the period you would be wearing the  monitor. The patch cannot be worn during these tests. You cannot remove and re-apply the  ZIO XT patch monitor.  Your ZIO patch monitor will be mailed 3 day USPS to your address on file. It may take 3-5 days  to receive your monitor after you have been enrolled.  Once you have received your monitor, please review the enclosed instructions. Your monitor  has already been registered assigning a specific monitor serial # to you.  Billing and Patient Assistance Program Information  We have supplied [employer] with any of your insurance information on file for billing purposes. [employer] offers a sliding scale Patient Assistance Program for patients that do not have  insurance, or whose insurance does not completely cover the cost of the ZIO monitor.  You must apply for the Patient Assistance Program to qualify for this discounted rate.  To apply, please call [employer] at [phone], select option 4, select option 2, ask to apply for  Patient Assistance Program. Meredeth Ide will ask your household income, and how many people  are in your household. They will quote your out-of-pocket cost based on that information.  [employer] will also be able to set up a 2-month, interest-free payment plan if  needed.  Applying the monitor   Shave hair from upper left chest.  Hold abrader disc by orange tab. Rub abrader in 40 strokes over the upper left chest as  indicated in your monitor instructions.  Clean area with 4 enclosed alcohol pads. Let dry.  Apply patch as indicated in monitor instructions. Patch will be placed under collarbone on left  side of chest with arrow pointing upward.  Rub patch adhesive wings for 2 minutes. Remove white label marked "1". Remove the white  label marked "2". Rub patch adhesive wings for 2 additional minutes.  While looking in a mirror, press and release button in center of patch. A small green light will  flash 3-4 times. This will be your only indicator that the monitor has been turned on.  Do not shower for the first 24 hours. You may shower after the first 24 hours.  Press the button if you feel a symptom. You will hear a small click. Record Date, Time and  Symptom in the Patient Logbook.  When you are ready to remove the patch, follow instructions on the last 2 pages of Patient  Logbook. Stick patch monitor onto the last page of Patient Logbook.  Place Patient Logbook in the blue and white box.  Use locking tab on box and tape box closed  securely. The blue and white box has prepaid postage on it. Please place it in the mailbox as  soon as possible. Your physician should have your test results approximately 7 days after the  monitor has been mailed back to [facility].  Call [facility] Customer Care at [phone] if you have questions regarding  your ZIO XT patch monitor. Call them immediately if you see an orange light blinking on your  monitor.  If your monitor falls off in less than 4 days, contact our Monitor department at [phone].  If your monitor becomes loose or falls off after 4 days call [employer] at [phone] for  suggestions on securing your monitor    Follow-Up: At [facility], you and your health needs are  our priority.  As part of our continuing mission to provide you with exceptional heart care, we have created designated Provider Care Teams.  These Care Teams include your primary Cardiologist (physician) and Advanced Practice Providers (APPs -  Physician Assistants and Nurse Practitioners) who all work together to provide you with the care you need, when you need it.  We recommend signing up for the patient portal called "MyChart".  Sign up information is provided on this After Visit Summary.  MyChart is used to connect with patients for Virtual Visits (Telemedicine).  Patients are able to view lab/test results, encounter notes, upcoming appointments, etc.  Non-urgent messages can be sent to your provider as well.   To learn more about what you can do with MyChart, go to [email or web address].    Your next appointment:    3-4 months with Dr. Flora Lipps     Signed, Lenna Gilford. Flora Lipps, MD, [facility]  [facility]  [address], [address] [place], [place] [address] [phone]  12/28/2022 3:32 PM

## 2022-12-28 ENCOUNTER — Encounter: Payer: Self-pay | Admitting: Cardiovascular Disease

## 2022-12-28 ENCOUNTER — Ambulatory Visit (INDEPENDENT_AMBULATORY_CARE_PROVIDER_SITE_OTHER): Payer: Medicare Other

## 2022-12-28 ENCOUNTER — Ambulatory Visit: Payer: Medicare Other | Attending: Cardiovascular Disease | Admitting: Cardiovascular Disease

## 2022-12-28 VITALS — BP 132/82 | HR 99 | Ht 64.0 in | Wt 173.0 lb

## 2022-12-28 DIAGNOSIS — R002 Palpitations: Secondary | ICD-10-CM

## 2022-12-28 DIAGNOSIS — I493 Ventricular premature depolarization: Secondary | ICD-10-CM | POA: Diagnosis not present

## 2022-12-28 MED ORDER — METOPROLOL SUCCINATE ER 25 MG PO TB24: 25.0000 mg | ORAL_TABLET | Freq: Every day | ORAL | 3 refills | Status: DC

## 2022-12-28 NOTE — Progress Notes (Unsigned)
Enrolled patient for a 7 day Zio XT monitor to be mailed to patients home.  

## 2022-12-28 NOTE — Patient Instructions (Signed)
Medication Instructions:  START metoprolol succinate 25mg  daily  *If you need a refill on your cardiac medications before your next appointment, please call your pharmacy*   Lab Work: TSH, CBC, CMET today  If you have labs (blood work) drawn today and your tests are completely normal, you will receive your results only by: MyChart Message (if you have MyChart) OR A paper copy in the mail If you have any lab test that is abnormal or we need to change your treatment, we will call you to review the results.   Testing/Procedures: Your physician has requested that you have an echocardiogram. Echocardiography is a painless test that uses sound waves to create images of your heart. It provides your doctor with information about the size and shape of your heart and how well your heart's chambers and valves are working. This procedure takes approximately one hour. There are no restrictions for this procedure. Please do NOT wear cologne, perfume, aftershave, or lotions (deodorant is allowed). Please arrive 15 minutes prior to your appointment time.  ZIO XT- Long Term Monitor Instructions  Your physician has requested you wear a ZIO patch monitor for 7 days.  This is a single patch monitor. [employer] supplies one patch monitor per enrollment. Additional stickers are not available. Please do not apply patch if you will be having a Nuclear Stress Test,  Echocardiogram, Cardiac CT, MRI, or Chest Xray during the period you would be wearing the  monitor. The patch cannot be worn during these tests. You cannot remove and re-apply the  ZIO XT patch monitor.  Your ZIO patch monitor will be mailed 3 day USPS to your address on file. It may take 3-5 days  to receive your monitor after you have been enrolled.  Once you have received your monitor, please review the enclosed instructions. Your monitor  has already been registered assigning a specific monitor serial # to you.  Billing and Patient Assistance  Program Information  We have supplied [employer] with any of your insurance information on file for billing purposes. [employer] offers a sliding scale Patient Assistance Program for patients that do not have  insurance, or whose insurance does not completely cover the cost of the ZIO monitor.  You must apply for the Patient Assistance Program to qualify for this discounted rate.  To apply, please call [employer] at [phone], select option 4, select option 2, ask to apply for  Patient Assistance Program. Meredeth Ide will ask your household income, and how many people  are in your household. They will quote your out-of-pocket cost based on that information.  [employer] will also be able to set up a 40-month, interest-free payment plan if needed.  Applying the monitor   Shave hair from upper left chest.  Hold abrader disc by orange tab. Rub abrader in 40 strokes over the upper left chest as  indicated in your monitor instructions.  Clean area with 4 enclosed alcohol pads. Let dry.  Apply patch as indicated in monitor instructions. Patch will be placed under collarbone on left  side of chest with arrow pointing upward.  Rub patch adhesive wings for 2 minutes. Remove white label marked "1". Remove the white  label marked "2". Rub patch adhesive wings for 2 additional minutes.  While looking in a mirror, press and release button in center of patch. A small green light will  flash 3-4 times. This will be your only indicator that the monitor has been turned on.  Do not shower for the first 24  hours. You may shower after the first 24 hours.  Press the button if you feel a symptom. You will hear a small click. Record Date, Time and  Symptom in the Patient Logbook.  When you are ready to remove the patch, follow instructions on the last 2 pages of Patient  Logbook. Stick patch monitor onto the last page of Patient Logbook.  Place Patient Logbook in the blue and white box. Use locking tab on box and tape box  closed  securely. The blue and white box has prepaid postage on it. Please place it in the mailbox as  soon as possible. Your physician should have your test results approximately 7 days after the  monitor has been mailed back to [facility].  Call [facility] Customer Care at [phone] if you have questions regarding  your ZIO XT patch monitor. Call them immediately if you see an orange light blinking on your  monitor.  If your monitor falls off in less than 4 days, contact our Monitor department at [phone].  If your monitor becomes loose or falls off after 4 days call [employer] at [phone] for  suggestions on securing your monitor    Follow-Up: At [facility], you and your health needs are our priority.  As part of our continuing mission to provide you with exceptional heart care, we have created designated Provider Care Teams.  These Care Teams include your primary Cardiologist (physician) and Advanced Practice Providers (APPs -  Physician Assistants and Nurse Practitioners) who all work together to provide you with the care you need, when you need it.  We recommend signing up for the patient portal called "MyChart".  Sign up information is provided on this After Visit Summary.  MyChart is used to connect with patients for Virtual Visits (Telemedicine).  Patients are able to view lab/test results, encounter notes, upcoming appointments, etc.  Non-urgent messages can be sent to your provider as well.   To learn more about what you can do with MyChart, go to [email or web address].    Your next appointment:    3-4 months with Dr. Flora Lipps

## 2022-12-29 LAB — COMPREHENSIVE METABOLIC PANEL
ALT: 18 IU/L (ref 0–32)
AST: 19 IU/L (ref 0–40)
Albumin: 4.6 g/dL (ref 3.9–4.9)
Alkaline Phosphatase: 104 IU/L (ref 44–121)
BUN/Creatinine Ratio: 23 (ref 12–28)
BUN: 16 mg/dL (ref 8–27)
Bilirubin Total: 0.3 mg/dL (ref 0.0–1.2)
CO2: 19 mmol/L — ABNORMAL LOW (ref 20–29)
Calcium: 9.5 mg/dL (ref 8.7–10.3)
Chloride: 103 mmol/L (ref 96–106)
Creatinine, Ser: 0.69 mg/dL (ref 0.57–1.00)
Globulin, Total: 2.6 g/dL (ref 1.5–4.5)
Glucose: 79 mg/dL (ref 70–99)
Potassium: 4.2 mmol/L (ref 3.5–5.2)
Sodium: 139 mmol/L (ref 134–144)
Total Protein: 7.2 g/dL (ref 6.0–8.5)
eGFR: 95 mL/min/{1.73_m2} (ref >59)

## 2022-12-29 LAB — CBC
Hematocrit: 38.2 % (ref 34.0–46.6)
Hemoglobin: 11.9 g/dL (ref 11.1–15.9)
MCH: 25.4 pg — ABNORMAL LOW (ref 26.6–33.0)
MCHC: 31.2 g/dL — ABNORMAL LOW (ref 31.5–35.7)
MCV: 81 fL (ref 79–97)
Platelets: 266 10*3/uL (ref 150–450)
RBC: 4.69 x10E6/uL (ref 3.77–5.28)
RDW: 13.7 % (ref 11.7–15.4)
WBC: 6 10*3/uL (ref 3.4–10.8)

## 2022-12-29 LAB — TSH: TSH: 2.24 u[IU]/mL (ref 0.450–4.500)

## 2022-12-30 ENCOUNTER — Other Ambulatory Visit (HOSPITAL_COMMUNITY): Payer: Self-pay

## 2022-12-31 DIAGNOSIS — I493 Ventricular premature depolarization: Secondary | ICD-10-CM | POA: Diagnosis not present

## 2022-12-31 DIAGNOSIS — R002 Palpitations: Secondary | ICD-10-CM

## 2023-01-13 DIAGNOSIS — I493 Ventricular premature depolarization: Secondary | ICD-10-CM | POA: Diagnosis not present

## 2023-01-13 DIAGNOSIS — R002 Palpitations: Secondary | ICD-10-CM | POA: Diagnosis not present

## 2023-01-18 ENCOUNTER — Telehealth: Payer: Self-pay | Admitting: *Deleted

## 2023-01-18 DIAGNOSIS — R002 Palpitations: Secondary | ICD-10-CM

## 2023-01-18 DIAGNOSIS — I493 Ventricular premature depolarization: Secondary | ICD-10-CM

## 2023-01-18 MED ORDER — METOPROLOL SUCCINATE ER 50 MG PO TB24
50.0000 mg | ORAL_TABLET | Freq: Every day | ORAL | 3 refills | Status: DC
Start: 2023-01-18 — End: 2023-12-19

## 2023-01-18 NOTE — Telephone Encounter (Signed)
Spoke with pt, aware of results and Patient voiced understanding of medication change. New script sent to the pharmacy

## 2023-01-18 NOTE — Telephone Encounter (Signed)
-----   Message from Reatha Harps sent at 01/15/2023  8:46 PM EDT ----- PVCs with 7.4% burden.  I would like to increase her metoprolol succinate to 50 mg daily.  MyChart message.  She has an echo pending.  She will see me back in the next few months.  Gerri Spore T. Flora Lipps, MD, [facility]  [facility]  [address] [place], [place] [address] [phone]  8:45 PM

## 2023-01-18 NOTE — Telephone Encounter (Signed)
Left message for pt to call or review results in my chart.

## 2023-01-19 ENCOUNTER — Ambulatory Visit (HOSPITAL_COMMUNITY): Payer: Medicare Other | Attending: Cardiovascular Disease

## 2023-01-19 DIAGNOSIS — R002 Palpitations: Secondary | ICD-10-CM | POA: Diagnosis not present

## 2023-01-19 DIAGNOSIS — I493 Ventricular premature depolarization: Secondary | ICD-10-CM | POA: Insufficient documentation

## 2023-01-19 DIAGNOSIS — I340 Nonrheumatic mitral (valve) insufficiency: Secondary | ICD-10-CM | POA: Diagnosis not present

## 2023-01-19 LAB — ECHOCARDIOGRAM COMPLETE
Area-P 1/2: 6.12 cm2
MV M vel: 4.96 m/s
MV Peak grad: 98.4 mmHg
S' Lateral: 4.5 cm

## 2023-01-19 MED ORDER — PERFLUTREN LIPID MICROSPHERE
1.0000 mL | INTRAVENOUS | Status: AC | PRN
Start: 2023-01-19 — End: 2023-01-19
  Administered 2023-01-19: 2 mL via INTRAVENOUS

## 2023-02-09 ENCOUNTER — Encounter: Payer: Self-pay | Admitting: Cardiovascular Disease

## 2023-02-09 ENCOUNTER — Ambulatory Visit: Payer: Medicare Other | Attending: Cardiovascular Disease | Admitting: Cardiovascular Disease

## 2023-02-09 VITALS — BP 132/82 | HR 86 | Ht 64.0 in | Wt 173.8 lb

## 2023-02-09 DIAGNOSIS — I10 Essential (primary) hypertension: Secondary | ICD-10-CM

## 2023-02-09 DIAGNOSIS — I493 Ventricular premature depolarization: Secondary | ICD-10-CM | POA: Diagnosis not present

## 2023-02-09 DIAGNOSIS — I5022 Chronic systolic (congestive) heart failure: Secondary | ICD-10-CM

## 2023-02-09 MED ORDER — ENTRESTO 24-26 MG PO TABS: 1.0000 | ORAL_TABLET | Freq: Two times a day (BID) | ORAL | 0 refills | Status: DC

## 2023-02-09 MED ORDER — SPIRONOLACTONE 25 MG PO TABS: 25.0000 mg | ORAL_TABLET | Freq: Every day | ORAL | 3 refills | Status: DC

## 2023-02-09 NOTE — Progress Notes (Signed)
Cardiology Office Note:   Date:  02/09/2023  NAME:  Hayley Fox    MRN: [id number] DOB:  Nov 14, 1955   PCP:  Hayley Georges, MD  Cardiologist:  None  Electrophysiologist:  None   Referring MD: Hayley Georges, MD   Chief Complaint  Patient presents with   Follow-up         History of Present Illness:   Hayley Fox is a 67 y.o. female with a hx of HTN, DM, PVCs, CHF who presents for follow-up.  She reports she is doing well.  We went over the results of her heart monitor as well as echocardiogram.  She is experiencing some palpitations but they are infrequent.  She is on metoprolol.  No symptoms of heart failure.  No lower extremity edema.  She reports no chest pain.  No symptoms of heart failure.  No lower extremity edema.  She reports no chest pain.  Her ejection fraction is 40% with global hypokinesis.  No symptoms of angina.  We did discuss further titration of medical therapy and excluding CAD.  I did review a CT scan of her abdomen which shows no evidence of coronary calcium.  No family history of heart failure per her report.  Unclear if the PVCs are causing her systolic dysfunction.  No history of sarcoidosis or autoimmune disease.  She is diabetic but this is well-controlled.  CV exam unremarkable today.  Problem List HTN DM -A1c 6.7 3. HLD -T chol 133, HDL 50, LDL 68, TG 76 4. PVCs -7.4% burden 5. 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   fluticasone (FLONASE) 50 MCG/ACT nasal spray SPRAY 1 SPRAY INTO BOTH NOSTRILS DAILY.   metFORMIN (GLUCOPHAGE-XR) 750 MG 24 hr tablet Take 1 tablet (750 mg total) by mouth in the morning and at bedtime. TAKE 1 TABLET(500 MG) BY MOUTH TWICE DAILY WITH A MEAL   metoprolol succinate (TOPROL XL) 50 MG 24 hr tablet Take 1 tablet (50 mg total) by mouth daily.   OVER THE COUNTER MEDICATION Benefactor supplement   rosuvastatin (CRESTOR) 10 MG tablet TAKE 1 TABLET BY MOUTH EVERY DAY   sacubitril-valsartan (ENTRESTO) 24-26 MG Take 1 tablet by mouth 2 (two) times daily.   Semaglutide (RYBELSUS) 14 MG TABS Take 1 tablet (14 mg total) by mouth daily.   spironolactone (ALDACTONE) 25 MG tablet Take 1 tablet (25 mg total) by mouth daily.   [DISCONTINUED] amLODipine (NORVASC) 10 MG tablet Take 1 tablet (10 mg total) by mouth daily.   [DISCONTINUED] losartan (COZAAR) 50 MG tablet TAKE 1 TABLET BY MOUTH EVERY DAY   Current Facility-Administered Medications  for the 02/09/23 encounter (Office Visit) with Sande Rives, MD  Medication   0.9 %  sodium chloride infusion     Allergies:    Patient has no known allergies.   Social History: Social History   Socioeconomic History   Marital status: Single    Spouse name: Not on file   Number of children: 4   Years of education: Not on file   Highest education level: Not on file  Occupational History   Occupation: Care Giver  Tobacco Use   Smoking status: Former    Types: Cigarettes   Smokeless tobacco: Never  Vaping Use   Vaping status: Never Used  Substance and Sexual Activity   Alcohol use: Yes    Comment: socially-once a month per pt.   Drug use: No   Sexual activity: Not Currently     Birth control/protection: None  Other Topics Concern   Not on file  Social History Narrative   Work or School: live in caregiver      Home Situation: lives with client several days per week      Spiritual Beliefs: catholic      Lifestyle: exercises 2 days per week; diet is pretty healthy      Social Determinants of Health   Financial Resource Strain: Low Risk  (11/07/2022)   Overall Financial Resource Strain (CARDIA)    Difficulty of Paying Living Expenses: Not hard at all  Food Insecurity: No Food Insecurity (11/07/2022)   Hunger Vital Sign    Worried About Running Out of Food in the Last Year: Never true    Ran Out of Food in the Last Year: Never true  Transportation Needs: No Transportation Needs (11/07/2022)   PRAPARE - Administrator, Civil Service (Medical): No    Lack of Transportation (Non-Medical): No  Physical Activity: Inactive (11/07/2022)   Exercise Vital Sign    Days of Exercise per Week: 0 days    Minutes of Exercise per Session: 0 min  Stress: No Stress Concern Present (11/07/2022)   [employer] of Occupational Health - Occupational Stress Questionnaire    Feeling of Stress : Not at all  Social Connections: Moderately Integrated (11/07/2022)   Social Connection and Isolation Panel [NHANES]    Frequency of Communication with Friends and Family: More than three times a week    Frequency of Social Gatherings with Friends and Family: More than three times a week    Attends Religious Services: More than 4 times per year    Active Member of [employer] or Organizations: Yes    Attends Engineer, structural: More than 4 times per year    Marital Status: Never married     Family History: The patient's family history includes Alcohol abuse in her mother; Cirrhosis in her mother; Diabetes in her brother and father; High Cholesterol in her brother; High blood pressure in her brother; Hypertension in her father and mother. There is no history of Colon  cancer.  ROS:   All other ROS reviewed and negative. Pertinent positives noted in the HPI.     EKGs/Labs/Other Studies Reviewed:   The following studies were personally reviewed by me today:  EKG:  EKG is not ordered today.        TTE 01/19/2023  1. Left ventricular ejection fraction, by estimation, is 40 to 45%. Left  ventricular ejection fraction by 3D volume is 40 %. The left ventricle has  mildly decreased function. The left ventricle demonstrates global  hypokinesis. Left ventricular diastolic   parameters are consistent with Grade I diastolic dysfunction (impaired  relaxation). Definity images poor quality.   2. Right ventricular systolic function is normal. The right ventricular  size is normal. There is normal pulmonary artery systolic pressure. The  estimated right ventricular systolic pressure is 24.5 mmHg.   3. The mitral valve is normal in structure. Mild mitral valve  regurgitation. No evidence of mitral stenosis.   4. The aortic valve is tricuspid. Aortic valve regurgitation is not  visualized. Aortic valve sclerosis/calcification is present, without any  evidence of aortic stenosis.   5. The inferior vena cava is normal in size with greater than 50%  respiratory variability, suggesting right atrial pressure of 3 mmHg.   Recent Labs: 12/28/2022: ALT 18; BUN 16; Creatinine, Ser 0.69; Hemoglobin 11.9; Platelets 266; Potassium 4.2; Sodium 139; TSH 2.240   Recent Lipid Panel    Component Value Date/Time   CHOL 133 01/13/2022 0721   TRIG 76.0 01/13/2022 0721   HDL 50.00 01/13/2022 0721   CHOLHDL 3 01/13/2022 0721   VLDL 15.2 01/13/2022 0721   LDLCALC 68 01/13/2022 0721    Physical Exam:   VS:  BP 132/82 (BP Location: Left Arm, Patient Position: Sitting, Cuff Size: Normal)   Pulse 86   Ht 5\' 4"  (1.626 m)   Wt 173 lb 12.8 oz (78.8 kg)   LMP 07/29/2011   SpO2 97%   BMI 29.83 kg/m    Wt Readings from Last 3 Encounters:  02/09/23 173 lb 12.8 oz (78.8 kg)   12/28/22 173 lb (78.5 kg)  11/07/22 171 lb 14.4 oz (78 kg)    General: Well nourished, well developed, in no acute distress Head: Atraumatic, normal size  Eyes: PEERLA, EOMI  Neck: Supple, no JVD Endocrine: No thryomegaly Cardiac: Normal S1, S2; RRR; no murmurs, rubs, or gallops Lungs: Clear to auscultation bilaterally, no wheezing, rhonchi or rales  Abd: Soft, nontender, no hepatomegaly  Ext: No edema, pulses 2+ Musculoskeletal: No deformities, BUE and BLE strength normal and equal Skin: Warm and dry, no rashes   Neuro: Alert and oriented to person, place, time, and situation, CNII-XII grossly intact, no focal deficits  Psych: Normal mood and affect   ASSESSMENT:   Smriti Hills is a 67 y.o. female who presents for the following: 1. PVC (premature ventricular contraction)   2. Chronic systolic heart failure (HCC)   3. Primary hypertension     PLAN:   1. PVC (premature ventricular contraction) 2. Chronic systolic heart failure (HCC) 3. Primary hypertension -Found to have systolic dysfunction.  Also with PVC burden of 7.4%.  Unclear if PVCs are leading to cardiomyopathy or cardiomyopathy is causing PVCs.  No symptoms of angina.  She needs a coronary CTA to exclude ischemic etiology.  She will take 100 mg of metoprolol succinate 2 hours before the scan.  She is on losartan and amlodipine.  We will stop both.  Start Entresto 24-26 mg twice daily and Aldactone 25 mg daily.  She needs a BMP today.  No need for Lasix.  We discussed Jardiance but she reports having side effects to this.  She is reluctant to try [place].  We will have her come back in 2 weeks for medication titration.  She may be more amendable to trying Farxiga at that time.  I will see her back in 3 months.  She may end up needing a cardiac MRI if her ejection fraction or PVCs continue.  Disposition: Return in about  3 months (around 05/12/2023).  Medication Adjustments/Labs and Tests Ordered: Current medicines are reviewed  at length with the patient today.  Concerns regarding medicines are outlined above.  Orders Placed This Encounter  Procedures   CT CORONARY MORPH W/CTA COR W/SCORE W/CA W/CM &/OR WO/CM   Basic metabolic panel   AMB Referral to [employer] [facility]   Meds ordered this encounter  Medications   spironolactone (ALDACTONE) 25 MG tablet    Sig: Take 1 tablet (25 mg total) by mouth daily.    Dispense:  90 tablet    Refill:  3   sacubitril-valsartan (ENTRESTO) 24-26 MG    Sig: Take 1 tablet by mouth 2 (two) times daily.    Dispense:  28 tablet    Refill:  0    Order Specific Question:   Lot Number?    Answer:   [id number]    Order Specific Question:   Expiration Date?    Answer:   02/18/2024    Order Specific Question:   Quantity    Answer:   28   Patient Instructions  Medication Instructions:  Stop Losartan Stop Amlodipine TOMORROW:   Start Entresto 24/26 twice a day   Start Spironolactone 25 mg daily  Take 100 mg of Metoprolol Succinate 2 hours prior to CT *If you need a refill on your cardiac medications before your next appointment, please call your pharmacy*   Lab Work: [employer]- today If you have labs (blood work) drawn today and your tests are completely normal, you will receive your results only by: MyChart Message (if you have MyChart) OR A paper copy in the mail If you have any lab test that is abnormal or we need to change your treatment, we will call you to review the results.   Testing/Procedures:   Your cardiac CT will be scheduled at one of the below locations:   [facility] [address] [place], [place] [address] [phone]  If scheduled at [facility], please arrive at the [facility] and Children's Entrance (Entrance C2) of [facility] 30 minutes prior to test start time. You can use the FREE valet parking offered at entrance C (encouraged to control the heart rate for the test)  Proceed to the [facility] Radiology Department (first  floor) to check-in and test prep.  All radiology patients and guests should use entrance C2 at [facility], accessed from [facility], even though the hospital's physical address listed is [address]     Please follow these instructions carefully (unless otherwise directed):  An IV will be required for this test and Nitroglycerin will be given.    On the Night Before the Test: Be sure to Drink plenty of water. Do not consume any caffeinated/decaffeinated beverages or chocolate 12 hours prior to your test. Do not take any antihistamines 12 hours prior to your test.  On the Day of the Test: Drink plenty of water until 1 hour prior to the test. Do not eat any food 1 hour prior to test. You may take your regular medications prior to the test.  Take 100 mg of Metoprolol Succinate 2 hours prior to CT If you take Furosemide/Hydrochlorothiazide/Spironolactone, please HOLD on the morning of the test. FEMALES- please wear underwire-free bra if available, avoid dresses & tight clothing   After the Test: Drink plenty of water. After receiving IV contrast, you may experience a mild flushed feeling. This is normal. On occasion, you may experience a mild  rash up to 24 hours after the test. This is not dangerous. If this occurs, you can take Benadryl 25 mg and increase your fluid intake. If you experience trouble breathing, this can be serious. If it is severe call 911 IMMEDIATELY. If it is mild, please call our office. If you take any of these medications: Glipizide/Metformin, Avandament, Glucavance, please do not take 48 hours after completing test unless otherwise instructed.  We will call to schedule your test 2-4 weeks out understanding that some insurance companies will need an authorization prior to the service being performed.   For more information and frequently asked questions, please visit our website : [email or web address]  For  non-scheduling related questions, please contact the cardiac imaging nurse navigator should you have any questions/concerns: Cardiac Imaging Nurse Navigators Direct Office Dial: [phone]   For scheduling needs, including cancellations and rescheduling, please call [place], [phone].    Follow-Up: At [facility], you and your health needs are our priority.  As part of our continuing mission to provide you with exceptional heart care, we have created designated Provider Care Teams.  These Care Teams include your primary Cardiologist (physician) and Advanced Practice Providers (APPs -  Physician Assistants and Nurse Practitioners) who all work together to provide you with the care you need, when you need it.  We recommend signing up for the patient portal called "MyChart".  Sign up information is provided on this After Visit Summary.  MyChart is used to connect with patients for Virtual Visits (Telemedicine).  Patients are able to view lab/test results, encounter notes, upcoming appointments, etc.  Non-urgent messages can be sent to your provider as well.   To learn more about what you can do with MyChart, go to [email or web address].    Your next appointment:   3 month(s)  Provider:   Dr Antionette Char D- needed in 2 weeks for Heart Failure Medication/titration    Time Spent with Patient: I have spent a total of 35 minutes with patient reviewing hospital notes, telemetry, EKGs, labs and examining the patient as well as establishing an assessment and plan that was discussed with the patient.  > 50% of time was spent in direct patient care.  Signed, Lenna Gilford. Flora Lipps, MD, [facility]  [facility]  [address], [address] [place], [place] [address] [phone]  02/09/2023 3:06 PM

## 2023-02-09 NOTE — Patient Instructions (Signed)
Medication Instructions:  Stop Losartan Stop Amlodipine TOMORROW:   Start Entresto 24/26 twice a day   Start Spironolactone 25 mg daily  Take 100 mg of Metoprolol Succinate 2 hours prior to CT *If you need a refill on your cardiac medications before your next appointment, please call your pharmacy*   Lab Work: BMP- today If you have labs (blood work) drawn today and your tests are completely normal, you will receive your results only by: MyChart Message (if you have MyChart) OR A paper copy in the mail If you have any lab test that is abnormal or we need to change your treatment, we will call you to review the results.   Testing/Procedures:   Your cardiac CT will be scheduled at one of the below locations:   [facility] [address] [place], [place] [address] [phone]  If scheduled at [facility], please arrive at the [facility] and Children's Entrance (Entrance C2) of [facility] 30 minutes prior to test start time. You can use the FREE valet parking offered at entrance C (encouraged to control the heart rate for the test)  Proceed to the [facility] Radiology Department (first floor) to check-in and test prep.  All radiology patients and guests should use entrance C2 at [facility], accessed from [facility], even though the hospital's physical address listed is [address].     Please follow these instructions carefully (unless otherwise directed):  An IV will be required for this test and Nitroglycerin will be given.    On the Night Before the Test: Be sure to Drink plenty of water. Do not consume any caffeinated/decaffeinated beverages or chocolate 12 hours prior to your test. Do not take any antihistamines 12 hours prior to your test.  On the Day of the Test: Drink plenty of water until 1 hour prior to the test. Do not eat any food 1 hour prior to test. You may take your regular medications prior  to the test.  Take 100 mg of Metoprolol Succinate 2 hours prior to CT If you take Furosemide/Hydrochlorothiazide/Spironolactone, please HOLD on the morning of the test. FEMALES- please wear underwire-free bra if available, avoid dresses & tight clothing   After the Test: Drink plenty of water. After receiving IV contrast, you may experience a mild flushed feeling. This is normal. On occasion, you may experience a mild rash up to 24 hours after the test. This is not dangerous. If this occurs, you can take Benadryl 25 mg and increase your fluid intake. If you experience trouble breathing, this can be serious. If it is severe call 911 IMMEDIATELY. If it is mild, please call our office. If you take any of these medications: Glipizide/Metformin, Avandament, Glucavance, please do not take 48 hours after completing test unless otherwise instructed.  We will call to schedule your test 2-4 weeks out understanding that some insurance companies will need an authorization prior to the service being performed.   For more information and frequently asked questions, please visit our website : [email or web address]  For non-scheduling related questions, please contact the cardiac imaging nurse navigator should you have any questions/concerns: Cardiac Imaging Nurse Navigators Direct Office Dial: [phone]   For scheduling needs, including cancellations and rescheduling, please call [place], [phone].    Follow-Up: At [facility], you and your health needs are our priority.  As part of our continuing mission to provide you with exceptional heart care, we have created designated Provider Care  Teams.  These Care Teams include your primary Cardiologist (physician) and Advanced Practice Providers (APPs -  Physician Assistants and Nurse Practitioners) who all work together to provide you with the care you need, when you need it.  We recommend signing up for the patient portal called  "MyChart".  Sign up information is provided on this After Visit Summary.  MyChart is used to connect with patients for Virtual Visits (Telemedicine).  Patients are able to view lab/test results, encounter notes, upcoming appointments, etc.  Non-urgent messages can be sent to your provider as well.   To learn more about what you can do with MyChart, go to [email or web address].    Your next appointment:   3 month(s)  Provider:   Dr Antionette Char D- needed in 2 weeks for Heart Failure Medication/titration

## 2023-02-10 LAB — BASIC METABOLIC PANEL
BUN/Creatinine Ratio: 21 (ref 12–28)
BUN: 14 mg/dL (ref 8–27)
CO2: 23 mmol/L (ref 20–29)
Calcium: 9.3 mg/dL (ref 8.7–10.3)
Chloride: 103 mmol/L (ref 96–106)
Creatinine, Ser: 0.68 mg/dL (ref 0.57–1.00)
Glucose: 102 mg/dL — ABNORMAL HIGH (ref 70–99)
Potassium: 4.4 mmol/L (ref 3.5–5.2)
Sodium: 141 mmol/L (ref 134–144)
eGFR: 95 mL/min/{1.73_m2} (ref >59)

## 2023-02-13 ENCOUNTER — Encounter (HOSPITAL_COMMUNITY): Payer: Self-pay

## 2023-02-14 ENCOUNTER — Telehealth (HOSPITAL_COMMUNITY): Payer: Self-pay | Admitting: *Deleted

## 2023-02-14 NOTE — Telephone Encounter (Signed)
Reaching out to patient to offer assistance regarding upcoming cardiac imaging study; pt verbalizes understanding of appt date/time, parking situation and where to check in, pre-test NPO status and medications ordered, and verified current allergies; name and call back number provided for further questions should they arise Hayley Sharpe RN Navigator Cardiac Imaging [facility] [phone] office [phone] cell  

## 2023-02-15 ENCOUNTER — Ambulatory Visit (HOSPITAL_COMMUNITY)
Admission: RE | Admit: 2023-02-15 | Discharge: 2023-02-15 | Disposition: A | Discharge status: Home / Self Care | Payer: Medicare Other | Source: Ambulatory Visit | Attending: Cardiovascular Disease | Admitting: Cardiovascular Disease

## 2023-02-15 ENCOUNTER — Encounter (HOSPITAL_COMMUNITY): Payer: Self-pay

## 2023-02-15 ENCOUNTER — Other Ambulatory Visit: Payer: Self-pay | Admitting: Family Medicine

## 2023-02-15 DIAGNOSIS — E785 Hyperlipidemia, unspecified: Secondary | ICD-10-CM

## 2023-02-15 DIAGNOSIS — I5022 Chronic systolic (congestive) heart failure: Secondary | ICD-10-CM | POA: Insufficient documentation

## 2023-02-15 MED ORDER — DILTIAZEM HCL 25 MG/5ML IV SOLN
INTRAVENOUS | Status: AC
  Filled 2023-02-15: qty 5

## 2023-02-15 MED ORDER — METOPROLOL TARTRATE 5 MG/5ML IV SOLN
INTRAVENOUS | Status: AC
  Filled 2023-02-15: qty 10

## 2023-02-15 MED ORDER — METOPROLOL TARTRATE 5 MG/5ML IV SOLN: 10.0000 mg | Freq: Once | INTRAVENOUS | Status: DC | PRN

## 2023-02-15 MED ORDER — NITROGLYCERIN 0.4 MG SL SUBL
SUBLINGUAL_TABLET | SUBLINGUAL | Status: AC
  Filled 2023-02-15: qty 2

## 2023-02-15 MED ORDER — NITROGLYCERIN 0.4 MG SL SUBL: 0.8000 mg | SUBLINGUAL_TABLET | Freq: Once | SUBLINGUAL | Status: DC

## 2023-02-15 MED ORDER — METOPROLOL TARTRATE 5 MG/5ML IV SOLN
5.0000 mg | Freq: Once | INTRAVENOUS | Status: AC | PRN
  Administered 2023-02-15: 5 mg via INTRAVENOUS

## 2023-02-15 MED ORDER — DILTIAZEM HCL 25 MG/5ML IV SOLN
10.0000 mg | INTRAVENOUS | Status: DC | PRN
  Administered 2023-02-15: 10 mg via INTRAVENOUS

## 2023-02-15 MED ORDER — DILTIAZEM HCL 25 MG/5ML IV SOLN: 5.0000 mg | INTRAVENOUS | Status: DC | PRN

## 2023-02-15 MED ORDER — METOPROLOL TARTRATE 5 MG/5ML IV SOLN
5.0000 mg | Freq: Once | INTRAVENOUS | Status: AC
  Administered 2023-02-15: 5 mg via INTRAVENOUS

## 2023-02-15 NOTE — Progress Notes (Signed)
Patient ID: Hayley Fox, female   DOB: [birth year], 67 y.o.   MRN: [id number] Pt came in for CT coronary with initial heart rate in the 90's. 10mg  of metoprolol iv given and with breath hold heart rate noted to be 71. Pt. Taken to CT and on their moniter every 4th beat noted to be a pvc with a baseline heart rate in the 80's. 10mg  cardizem given and baseline heart rate noted to be about 75 with pvc every 4th beat. Dr. Rennis Golden notified and advised to scan retro however CT tech advised against it and recommended rescheduling and Dr.Hilty was ok with this recommendation. Pt. States taking PO 100 mg metoprolol before arrival. Cardiac navigators notified.

## 2023-02-21 ENCOUNTER — Telehealth: Payer: Self-pay | Admitting: Cardiovascular Disease

## 2023-02-21 ENCOUNTER — Other Ambulatory Visit (HOSPITAL_COMMUNITY): Payer: Self-pay | Admitting: *Deleted

## 2023-02-21 DIAGNOSIS — I5022 Chronic systolic (congestive) heart failure: Secondary | ICD-10-CM

## 2023-02-21 MED ORDER — METOPROLOL TARTRATE 100 MG PO TABS: ORAL_TABLET | ORAL | 0 refills | Status: DC

## 2023-02-21 MED ORDER — IVABRADINE HCL 7.5 MG PO TABS: ORAL_TABLET | ORAL | 0 refills | Status: DC

## 2023-02-21 NOTE — Telephone Encounter (Signed)
Call to patient to discuss.  See pharmacy appt to titrate medication.   She is asking to not come in but have a med sent. I believe this may be Entresto she is referring to.  Please advise

## 2023-02-21 NOTE — Telephone Encounter (Signed)
Patient is missing too many days out of work. Patient would like to know if she can cancel her appointment with the pharmacist on 09/06 and have the medication sent to the [facility]. Please advise.

## 2023-02-22 NOTE — Telephone Encounter (Signed)
Patient is calling again for an update on this medication. Requesting call back.

## 2023-02-22 NOTE — Telephone Encounter (Signed)
Spoke to patient she stated she cannot keep appointment with Pharmacist on Fri 9/6.She wants pharmacist to refill Entresto.Stated she still works and cannot miss any more days.Advised I will send message to Pharm D.

## 2023-02-23 MED ORDER — ENTRESTO 24-26 MG PO TABS
1.0000 | ORAL_TABLET | Freq: Two times a day (BID) | ORAL | 0 refills | Status: AC
Start: 2023-02-23 — End: ?

## 2023-02-23 NOTE — Telephone Encounter (Signed)
Understood. However patient needs lab work updated since starting [employer]. Will place order

## 2023-02-23 NOTE — Telephone Encounter (Signed)
 Call to patient. LM to call office

## 2023-02-23 NOTE — Telephone Encounter (Signed)
Patient made aware labs needed .  She states her RX called in already.  Emphasized she still needs labs done.  She states understanding

## 2023-02-24 ENCOUNTER — Ambulatory Visit: Payer: Medicare Other

## 2023-02-24 ENCOUNTER — Encounter (HOSPITAL_COMMUNITY): Payer: Self-pay

## 2023-02-28 ENCOUNTER — Ambulatory Visit (HOSPITAL_COMMUNITY)
Admission: RE | Admit: 2023-02-28 | Discharge: 2023-02-28 | Disposition: A | Discharge status: Home / Self Care | Payer: Medicare Other | Source: Ambulatory Visit | Attending: Cardiovascular Disease | Admitting: Cardiovascular Disease

## 2023-02-28 DIAGNOSIS — I5022 Chronic systolic (congestive) heart failure: Secondary | ICD-10-CM

## 2023-02-28 MED ORDER — DILTIAZEM HCL 25 MG/5ML IV SOLN: 5.0000 mg | INTRAVENOUS | Status: DC | PRN

## 2023-02-28 MED ORDER — METOPROLOL TARTRATE 5 MG/5ML IV SOLN
INTRAVENOUS | Status: AC
  Filled 2023-02-28: qty 10

## 2023-02-28 MED ORDER — NITROGLYCERIN 0.4 MG SL SUBL
0.8000 mg | SUBLINGUAL_TABLET | Freq: Once | SUBLINGUAL | Status: AC
  Administered 2023-02-28: 0.8 mg via SUBLINGUAL

## 2023-02-28 MED ORDER — DILTIAZEM HCL 25 MG/5ML IV SOLN
INTRAVENOUS | Status: AC
  Filled 2023-02-28: qty 5

## 2023-02-28 MED ORDER — METOPROLOL TARTRATE 5 MG/5ML IV SOLN
5.0000 mg | INTRAVENOUS | Status: DC | PRN
  Administered 2023-02-28: 5 mg via INTRAVENOUS

## 2023-02-28 MED ORDER — IOHEXOL 350 MG/ML SOLN
95.0000 mL | Freq: Once | INTRAVENOUS | Status: AC | PRN
  Administered 2023-02-28: 95 mL via INTRAVENOUS

## 2023-02-28 MED ORDER — NITROGLYCERIN 0.4 MG SL SUBL
SUBLINGUAL_TABLET | SUBLINGUAL | Status: AC
  Filled 2023-02-28: qty 2

## 2023-02-28 NOTE — Progress Notes (Signed)
Spoke to Dr Jacques Navy about heart rate in the 80's with frequent PVC's. She called Casimiro Needle in [place] and he came to have a look at patient rhythm and we can go ahead with the scan once heart rate lowered.

## 2023-03-03 ENCOUNTER — Telehealth: Payer: Self-pay | Admitting: Cardiovascular Disease

## 2023-03-03 NOTE — Telephone Encounter (Signed)
Advised did not have her lab done as directed last week.  She states she got a call today about her meds.  I only see last message where she stated understanding of the lab draw R/T entresto.  She states again that she understands this lab needs to be done and that no further refills will be available without the lab.

## 2023-03-03 NOTE — Telephone Encounter (Signed)
Patient states she is returning a call regarding her medications.

## 2023-03-13 ENCOUNTER — Other Ambulatory Visit: Payer: Self-pay | Admitting: Cardiovascular Disease

## 2023-03-13 ENCOUNTER — Other Ambulatory Visit: Payer: Self-pay

## 2023-03-13 DIAGNOSIS — I5022 Chronic systolic (congestive) heart failure: Secondary | ICD-10-CM | POA: Diagnosis not present

## 2023-03-13 LAB — BASIC METABOLIC PANEL
BUN/Creatinine Ratio: 19 (ref 12–28)
BUN: 14 mg/dL (ref 8–27)
CO2: 21 mmol/L (ref 20–29)
Calcium: 9.2 mg/dL (ref 8.7–10.3)
Chloride: 107 mmol/L — ABNORMAL HIGH (ref 96–106)
Creatinine, Ser: 0.74 mg/dL (ref 0.57–1.00)
Glucose: 97 mg/dL (ref 70–99)
Potassium: 4.5 mmol/L (ref 3.5–5.2)
Sodium: 140 mmol/L (ref 134–144)
eGFR: 89 mL/min/{1.73_m2} (ref >59)

## 2023-03-23 ENCOUNTER — Other Ambulatory Visit: Payer: Self-pay | Admitting: Cardiovascular Disease

## 2023-03-23 DIAGNOSIS — I5022 Chronic systolic (congestive) heart failure: Secondary | ICD-10-CM

## 2023-04-20 ENCOUNTER — Other Ambulatory Visit: Payer: Self-pay | Admitting: Cardiovascular Disease

## 2023-04-20 DIAGNOSIS — I5022 Chronic systolic (congestive) heart failure: Secondary | ICD-10-CM

## 2023-04-25 ENCOUNTER — Encounter: Payer: Self-pay | Admitting: Family Medicine

## 2023-04-25 ENCOUNTER — Ambulatory Visit (INDEPENDENT_AMBULATORY_CARE_PROVIDER_SITE_OTHER): Payer: Medicare Other | Admitting: Family Medicine

## 2023-04-25 VITALS — BP 110/70 | HR 90 | Temp 98.1°F | Wt 170.0 lb

## 2023-04-25 DIAGNOSIS — M545 Low back pain, unspecified: Secondary | ICD-10-CM

## 2023-04-25 MED ORDER — METHYLPREDNISOLONE 4 MG PO TBPK: ORAL_TABLET | ORAL | 0 refills | Status: DC

## 2023-04-25 MED ORDER — CYCLOBENZAPRINE HCL 10 MG PO TABS: 10.0000 mg | ORAL_TABLET | Freq: Three times a day (TID) | ORAL | 0 refills | Status: DC | PRN

## 2023-04-25 NOTE — Progress Notes (Signed)
   Subjective:    Patient ID: Hayley Fox, female    DOB: 1955/10/06, 67 y.o.   MRN: [id number]  HPI Here for 5 days of pain in the lower back. No radiation to the legs. No recent trauma. She was seen in 2021 for a similar issue, and this responded well to steroids and muscle relaxers. Currently she is applying heat and Voltaren gel, and she is taking Tylenol. She had Xrays of her lumbar spine in 2022 showing degenerative changes and facet arthropathy.    Review of Systems  Constitutional: Negative.   Respiratory: Negative.    Cardiovascular: Negative.   Musculoskeletal:  Positive for back pain.       Objective:   Physical Exam Constitutional:      General: She is not in acute distress.    Appearance: Normal appearance.  Cardiovascular:     Rate and Rhythm: Normal rate and regular rhythm.     Pulses: Normal pulses.     Heart sounds: Normal heart sounds.  Pulmonary:     Effort: Pulmonary effort is normal.     Breath sounds: Normal breath sounds.  Musculoskeletal:     Comments: She is tender in the lower back but not the sciatic notches. ROM is full, but she has pain on full extension and full flexion  Neurological:     Mental Status: She is alert.           Assessment & Plan:  Low back pain. Treat with a Medrol dose pack and Flexeril. Recheck as needed.  Gershon Crane, MD

## 2023-05-10 ENCOUNTER — Ambulatory Visit: Payer: Medicare Other | Admitting: Family Medicine

## 2023-05-10 NOTE — Progress Notes (Unsigned)
Cardiology Office Note:  .   Date:  05/11/2023  ID:  Hayley Fox, DOB [birth year], MRN [id number] PCP: Karie Georges, MD  [facility] Providers Cardiologist:  None { History of Present Illness: .   Hayley Fox is a 67 y.o. female with history of CHF, non-obstructive CAD, HTN, DM, HLD who presents for follow-up.    History of Present Illness   Hayley Fox, a 67 year old female with a history of non-ischemic cardiomyopathy and PVCs, presents for follow-up. Her most recent ejection fraction was 45%. She reports a persistent cough, which she attributes to her current medication, Entresto. She works with elderly individuals and is concerned about the impact of her cough on her work environment. She denies experiencing chest pain or difficulty breathing. She is also on metoprolol 50mg  daily and spironolactone 25mg  daily. She is not currently on Farxiga or Jardiance, but is taking Rybelsus, which she prefers due to its beneficial effects on her appetite. She occasionally experiences heart racing but not as frequently as before. She is compliant with her cholesterol medication, rosuvastatin. She also reports phlegm production, which she finds disruptive during conversations. She denies current smoking and expresses a desire to consume alcohol during the upcoming holiday season.          Problem List HTN DM -A1c 6.7 3. HLD -T chol 133, HDL 50, LDL 68, TG 76 4. PVCs -7.4% burden 5. Systolic HF -EF 40-45% 6. Non-obstructive CAD -<25% stenosis -CAC 5 (61st percentile)     ROS: All other ROS reviewed and negative. Pertinent positives noted in the HPI.     Studies Reviewed: Marland Kitchen       Physical Exam:   VS:  BP 118/72 (BP Location: Left Arm, Patient Position: Sitting, Cuff Size: Normal)   Pulse 93   Ht 5\' 4"  (1.626 m)   Wt 170 lb 9.6 oz (77.4 kg)   LMP 07/29/2011   SpO2 98%   BMI 29.28 kg/m    Wt Readings from Last 3 Encounters:  05/11/23 170 lb 9.6 oz (77.4 kg)  04/25/23 170 lb  (77.1 kg)  02/09/23 173 lb 12.8 oz (78.8 kg)    GEN: Well nourished, well developed in no acute distress NECK: No JVD; No carotid bruits CARDIAC: RRR, no murmurs, rubs, gallops RESPIRATORY:  Clear to auscultation without rales, wheezing or rhonchi  ABDOMEN: Soft, non-tender, non-distended EXTREMITIES:  No edema; No deformity  ASSESSMENT AND PLAN: .   Assessment and Plan    Non-ischemic cardiomyopathy EF 45% with cough possibly related to [facility]. No chest pain or dyspnea reported. PVC burden 7.4%. -Discontinue Entresto due to cough. -Start Losartan 25mg  daily. -Add Farxiga 10mg  daily. -Continue Metoprolol succinate 50mg  daily and Aldactone 25mg  daily. -Repeat echocardiogram to assess LV function  -if no improvement consider cardiac MRI   PVCs -7.4% burden. Continue BB. Re-evaluate with echo above.   Non-obstructive CAD Calcium score 5, LDL 68 on Crestor. -Continue Crestor. -No need for aspirin.  Follow-up in 6 months.              Follow-up: Return in about 6 months (around 11/08/2023).  Time Spent with Patient: I have spent a total of 35 minutes caring for this patient today face to face, ordering and reviewing labs/tests, reviewing prior records/medical history, examining the patient, establishing an assessment and plan, communicating results/findings to the patient/family, and documenting in the medical record.   Signed, Lenna Gilford. Flora Lipps, MD, [facility]  [facility]  [address], [address] [place], [place] [address] [phone]  6:17 PM

## 2023-05-11 ENCOUNTER — Encounter: Payer: Self-pay | Admitting: Cardiovascular Disease

## 2023-05-11 ENCOUNTER — Ambulatory Visit: Payer: Medicare Other | Attending: Cardiovascular Disease | Admitting: Cardiovascular Disease

## 2023-05-11 ENCOUNTER — Other Ambulatory Visit: Payer: Self-pay | Admitting: Family Medicine

## 2023-05-11 VITALS — BP 118/72 | HR 93 | Ht 64.0 in | Wt 170.6 lb

## 2023-05-11 DIAGNOSIS — E782 Mixed hyperlipidemia: Secondary | ICD-10-CM | POA: Diagnosis not present

## 2023-05-11 DIAGNOSIS — I493 Ventricular premature depolarization: Secondary | ICD-10-CM

## 2023-05-11 DIAGNOSIS — I10 Essential (primary) hypertension: Secondary | ICD-10-CM | POA: Diagnosis not present

## 2023-05-11 DIAGNOSIS — I5022 Chronic systolic (congestive) heart failure: Secondary | ICD-10-CM | POA: Diagnosis not present

## 2023-05-11 DIAGNOSIS — E119 Type 2 diabetes mellitus without complications: Secondary | ICD-10-CM

## 2023-05-11 DIAGNOSIS — I2510 Atherosclerotic heart disease of native coronary artery without angina pectoris: Secondary | ICD-10-CM | POA: Diagnosis not present

## 2023-05-11 MED ORDER — DAPAGLIFLOZIN PROPANEDIOL 10 MG PO TABS: 10.0000 mg | ORAL_TABLET | Freq: Every day | ORAL | 3 refills | Status: DC

## 2023-05-11 MED ORDER — LOSARTAN POTASSIUM 25 MG PO TABS: 25.0000 mg | ORAL_TABLET | Freq: Every day | ORAL | 3 refills | Status: DC

## 2023-05-11 MED ORDER — DAPAGLIFLOZIN PROPANEDIOL 10 MG PO TABS: 10.0000 mg | ORAL_TABLET | Freq: Every day | ORAL | Status: DC

## 2023-05-11 NOTE — Patient Instructions (Signed)
Medication Instructions:  - STOP Entresto  - START losartan (COZAAR) 25MG  DAILY  - START FARXIGA 10MG  DAILY    *If you need a refill on your cardiac medications before your next appointment, please call your pharmacy*   Lab Work: NONE    If you have labs (blood work) drawn today and your tests are completely normal, you will receive your results only by: MyChart Message (if you have MyChart) OR A paper copy in the mail If you have any lab test that is abnormal or we need to change your treatment, we will call you to review the results.   Testing/Procedures: Echo will be scheduled at [address].  Your physician has requested that you have an echocardiogram. Echocardiography is a painless test that uses sound waves to create images of your heart. It provides your doctor with information about the size and shape of your heart and how well your heart's chambers and valves are working. This procedure takes approximately one hour. There are no restrictions for this procedure. Please do NOT wear cologne, perfume, aftershave, or lotions (deodorant is allowed). Please arrive 15 minutes prior to your appointment time.    Follow-Up: At [facility], you and your health needs are our priority.  As part of our continuing mission to provide you with exceptional heart care, we have created designated Provider Care Teams.  These Care Teams include your primary Cardiologist (physician) and Advanced Practice Providers (APPs -  Physician Assistants and Nurse Practitioners) who all work together to provide you with the care you need, when you need it.  We recommend signing up for the patient portal called "MyChart".  Sign up information is provided on this After Visit Summary.  MyChart is used to connect with patients for Virtual Visits (Telemedicine).  Patients are able to view lab/test results, encounter notes, upcoming appointments, etc.  Non-urgent messages can be sent to your  provider as well.   To learn more about what you can do with MyChart, go to [email or web address].    Your next appointment:   6 month(s)  The format for your next appointment:   In Person  Provider:   Dr. Lennie Odor, MD   Other Instructions

## 2023-05-30 ENCOUNTER — Other Ambulatory Visit (HOSPITAL_COMMUNITY): Payer: Medicare Other

## 2023-06-01 ENCOUNTER — Other Ambulatory Visit: Payer: Self-pay | Admitting: Family Medicine

## 2023-06-01 DIAGNOSIS — E119 Type 2 diabetes mellitus without complications: Secondary | ICD-10-CM

## 2023-06-02 NOTE — Telephone Encounter (Signed)
Rx denial sent for Amlodipine 10mg  as this was sent in error-discontinued by cardiology.

## 2023-06-08 ENCOUNTER — Other Ambulatory Visit: Payer: Self-pay | Admitting: Cardiovascular Disease

## 2023-06-08 ENCOUNTER — Ambulatory Visit (HOSPITAL_COMMUNITY)
Admission: RE | Admit: 2023-06-08 | Discharge: 2023-06-08 | Disposition: A | Discharge status: Home / Self Care | Payer: Medicare Other | Source: Ambulatory Visit | Attending: Cardiovascular Disease | Admitting: Cardiovascular Disease

## 2023-06-08 DIAGNOSIS — I5022 Chronic systolic (congestive) heart failure: Secondary | ICD-10-CM | POA: Diagnosis not present

## 2023-06-08 DIAGNOSIS — I420 Dilated cardiomyopathy: Secondary | ICD-10-CM

## 2023-06-08 LAB — ECHOCARDIOGRAM COMPLETE
AR max vel: 1.63 cm2
AV Area VTI: 1.53 cm2
AV Area mean vel: 1.57 cm2
AV Mean grad: 3 mm[Hg]
AV Peak grad: 4.9 mm[Hg]
Ao pk vel: 1.11 m/s
MV M vel: 4.51 m/s
MV Peak grad: 81.4 mm[Hg]
S' Lateral: 5.3 cm

## 2023-06-08 NOTE — Progress Notes (Signed)
Cardiac MRI ordered for cardiomyopathy.   Hayley Spore T. Flora Lipps, MD, [facility]  [facility]  [address] [place], [place] [address] [phone]  10:10 PM

## 2023-06-12 ENCOUNTER — Encounter: Payer: Self-pay | Admitting: Family Medicine

## 2023-06-12 ENCOUNTER — Ambulatory Visit: Payer: Medicare Other | Admitting: Family Medicine

## 2023-06-12 VITALS — BP 138/62 | HR 54 | Temp 97.8°F | Ht 64.0 in | Wt 164.7 lb

## 2023-06-12 DIAGNOSIS — Z7984 Long term (current) use of oral hypoglycemic drugs: Secondary | ICD-10-CM

## 2023-06-12 DIAGNOSIS — Z1231 Encounter for screening mammogram for malignant neoplasm of breast: Secondary | ICD-10-CM | POA: Diagnosis not present

## 2023-06-12 DIAGNOSIS — Z780 Asymptomatic menopausal state: Secondary | ICD-10-CM

## 2023-06-12 DIAGNOSIS — I10 Essential (primary) hypertension: Secondary | ICD-10-CM

## 2023-06-12 DIAGNOSIS — E119 Type 2 diabetes mellitus without complications: Secondary | ICD-10-CM | POA: Diagnosis not present

## 2023-06-12 LAB — POCT GLYCOSYLATED HEMOGLOBIN (HGB A1C): Hemoglobin A1C: 6.4 % — AB (ref 4.0–5.6)

## 2023-06-12 NOTE — Progress Notes (Signed)
Established Patient Office Visit  Subjective   Patient ID: Hayley Fox, female    DOB: 05-10-1956  Age: 67 y.o. MRN: [id number]  Chief Complaint  Patient presents with   Medical Management of Chronic Issues    Pt is here for 6 month follow up on her DM and HTN. After the last visit with the abnormal EKG she was sent to cardiology, was found to have global hypokinesis --EF of 40-45%, then was started on metoprolol 50 mg and entresto-- states the entresto caused a bad cough so she was switched to farxiga and losartan, still taking amlidipine 10 mg and also still on rybelsus. Is having more frequent episodes of being "out of body" or that she is trying to get her body to walk one way and it feels like she wants to go "the other way". States it only lasts for about 1 minute or so, gets really worried about these episodes. She has her freestyle libre and hasn't noticed that her blood sugars have been dropping. I advised that she discuss this with her cardiologist to see if her blood pressure is dropping when she stands up. I reviewed the ECHO and and notes from the cardiologist. She is scheduled for a cardiac MRI soon.    Current Outpatient Medications  Medication Instructions   albuterol (VENTOLIN HFA) 108 (90 Base) MCG/ACT inhaler TAKE 2 PUFFS BY MOUTH EVERY 6 HOURS AS NEEDED FOR WHEEZE OR SHORTNESS OF BREATH   amLODipine (NORVASC) 10 mg, Oral, Daily   Continuous Glucose Sensor (FREESTYLE LIBRE 14 DAY SENSOR) MISC 1 each, Does not apply, Every 14 days   dapagliflozin propanediol (FARXIGA) 10 mg, Oral, Daily before breakfast   dapagliflozin propanediol (FARXIGA) 10 mg, Oral, Daily before breakfast   fluticasone (FLONASE) 50 MCG/ACT nasal spray SPRAY 1 SPRAY INTO BOTH NOSTRILS DAILY.   losartan (COZAAR) 25 mg, Oral, Daily   losartan (COZAAR) 50 mg, Oral, Daily   metFORMIN (GLUCOPHAGE-XR) 750 mg, Oral, 2 times daily, TAKE 1 TABLET(500 MG) BY MOUTH TWICE DAILY WITH A MEAL   metoprolol succinate  (TOPROL XL) 50 mg, Oral, Daily   OVER THE COUNTER MEDICATION Benefactor supplement   rosuvastatin (CRESTOR) 10 mg, Oral, Daily   Rybelsus 14 mg, Oral, Daily   spironolactone (ALDACTONE) 25 mg, Oral, Daily    Patient Active Problem List   Diagnosis Date Noted   Vaginal candidiasis 01/12/2022   Fatigue 01/12/2022   Pain of left sacroiliac joint 01/20/2021   Diabetes mellitus type II, non insulin dependent (HCC) 10/09/2015   Hyperlipemia 10/09/2015   Allergic rhinitis 04/18/2013   Essential hypertension 12/03/2008      Review of Systems  All other systems reviewed and are negative.     Objective:     BP 138/62   Pulse (!) 54   Temp 97.8 F (36.6 C) (Oral)   Ht 5\' 4"  (1.626 m)   Wt 164 lb 11.2 oz (74.7 kg)   LMP 07/29/2011   SpO2 99%   BMI 28.27 kg/m  BP Readings from Last 3 Encounters:  06/12/23 138/62  05/11/23 118/72  04/25/23 110/70      Physical Exam Vitals reviewed.  Constitutional:      Appearance: Normal appearance. She is well-groomed and normal weight.  Eyes:     Conjunctiva/sclera: Conjunctivae normal.  Neck:     Thyroid: No thyromegaly.  Cardiovascular:     Rate and Rhythm: Normal rate and regular rhythm.     Pulses: Normal pulses.  Heart sounds: S1 normal and S2 normal.  Pulmonary:     Effort: Pulmonary effort is normal.     Breath sounds: Normal breath sounds and air entry.  Abdominal:     General: Bowel sounds are normal.  Musculoskeletal:     Right lower leg: No edema.     Left lower leg: No edema.  Neurological:     Mental Status: She is alert and oriented to person, place, and time. Mental status is at baseline.     Gait: Gait is intact.  Psychiatric:        Mood and Affect: Mood and affect normal.        Speech: Speech normal.        Behavior: Behavior normal.        Judgment: Judgment normal.      Results for orders placed or performed in visit on 06/12/23  POC HgB A1c  Result Value Ref Range   Hemoglobin A1C 6.4 (A)  4.0 - 5.6 %   HbA1c POC (<> result, manual entry)     HbA1c, POC (prediabetic range)     HbA1c, POC (controlled diabetic range)        The 10-year ASCVD risk score (Arnett DK, et al., 2019) is: 19%    Assessment & Plan:  Diabetes mellitus type II, non insulin dependent (HCC) Assessment & Plan: A1C performed in office today and is 6.4, well controlled, will continue metoformin 750 mg BID and rybelsus 14 mg daily. She has a CGM so her episodes are not likely related to her blood sugar since it has not been alarming.   Orders: -     POCT glycosylated hemoglobin (Hb A1C) -     Microalbumin / creatinine urine ratio; Future -     Basic metabolic panel; Future  Essential hypertension Assessment & Plan: Current hypertension medications:       Sig   amLODipine (NORVASC) 10 MG tablet (Taking) TAKE 1 TABLET BY MOUTH EVERY DAY   losartan (COZAAR) 25 MG tablet (Taking) Take 1 tablet (25 mg total) by mouth daily.   losartan (COZAAR) 50 MG tablet (Taking) TAKE 1 TABLET BY MOUTH EVERY DAY   metoprolol succinate (TOPROL XL) 50 MG 24 hr tablet (Taking) Take 1 tablet (50 mg total) by mouth daily.   spironolactone (ALDACTONE) 25 MG tablet Take 1 tablet (25 mg total) by mouth daily.     Chronic, stable, BP is controlled today, however she is experiencing these "out of body" episodes which could be related to the medication or her overall cardiac function.will check new BMP to look for any changes there. I advised that she discuss this with her cardiologist at the next visit and to have a blood pressure machine at home to check her BP when she has these episodes.    Breast cancer screening by mammogram -     3D Screening Mammogram, Left and Right; Future  Postmenopausal state -     DG Bone Density; Future     Return in about 6 months (around 12/11/2023) for DM.    Karie Georges, MD

## 2023-06-12 NOTE — Patient Instructions (Signed)
Try to reduce amlodipine to 1/2 tablet daily-- please check blood pressure every day-- should always be less than 140/90.

## 2023-06-15 ENCOUNTER — Other Ambulatory Visit (INDEPENDENT_AMBULATORY_CARE_PROVIDER_SITE_OTHER): Payer: Medicare Other

## 2023-06-15 DIAGNOSIS — E119 Type 2 diabetes mellitus without complications: Secondary | ICD-10-CM

## 2023-06-16 LAB — BASIC METABOLIC PANEL
BUN: 18 mg/dL (ref 6–23)
CO2: 27 meq/L (ref 19–32)
Calcium: 9.3 mg/dL (ref 8.4–10.5)
Chloride: 106 meq/L (ref 96–112)
Creatinine, Ser: 1.1 mg/dL (ref 0.40–1.20)
GFR: 51.95 mL/min — ABNORMAL LOW (ref >60.00)
Glucose, Bld: 106 mg/dL — ABNORMAL HIGH (ref 70–99)
Potassium: 3.9 meq/L (ref 3.5–5.1)
Sodium: 142 meq/L (ref 135–145)

## 2023-06-16 LAB — MICROALBUMIN / CREATININE URINE RATIO
Creatinine,U: 97.8 mg/dL
Microalb Creat Ratio: 1.1 mg/g (ref 0.0–30.0)
Microalb, Ur: 1.1 mg/dL (ref 0.0–1.9)

## 2023-06-19 NOTE — Assessment & Plan Note (Signed)
A1C performed in office today and is 6.4, well controlled, will continue metoformin 750 mg BID and rybelsus 14 mg daily. She has a CGM so her episodes are not likely related to her blood sugar since it has not been alarming.

## 2023-06-19 NOTE — Assessment & Plan Note (Signed)
Current hypertension medications:       Sig   amLODipine (NORVASC) 10 MG tablet (Taking) TAKE 1 TABLET BY MOUTH EVERY DAY   losartan (COZAAR) 25 MG tablet (Taking) Take 1 tablet (25 mg total) by mouth daily.   losartan (COZAAR) 50 MG tablet (Taking) TAKE 1 TABLET BY MOUTH EVERY DAY   metoprolol succinate (TOPROL XL) 50 MG 24 hr tablet (Taking) Take 1 tablet (50 mg total) by mouth daily.   spironolactone (ALDACTONE) 25 MG tablet Take 1 tablet (25 mg total) by mouth daily.     Chronic, stable, BP is controlled today, however she is experiencing these "out of body" episodes which could be related to the medication or her overall cardiac function.will check new BMP to look for any changes there. I advised that she discuss this with her cardiologist at the next visit and to have a blood pressure machine at home to check her BP when she has these episodes.

## 2023-07-04 ENCOUNTER — Ambulatory Visit
Admission: RE | Admit: 2023-07-04 | Discharge: 2023-07-04 | Disposition: A | Discharge status: Home / Self Care | Payer: Medicare Other | Source: Ambulatory Visit | Attending: Family Medicine

## 2023-07-04 DIAGNOSIS — Z1231 Encounter for screening mammogram for malignant neoplasm of breast: Secondary | ICD-10-CM

## 2023-08-08 ENCOUNTER — Encounter (HOSPITAL_COMMUNITY): Payer: Self-pay

## 2023-08-09 ENCOUNTER — Ambulatory Visit (HOSPITAL_COMMUNITY)
Admission: RE | Admit: 2023-08-09 | Discharge: 2023-08-09 | Disposition: A | Discharge status: Home / Self Care | Payer: Medicare Other | Source: Ambulatory Visit | Attending: Cardiovascular Disease | Admitting: Cardiovascular Disease

## 2023-08-09 ENCOUNTER — Other Ambulatory Visit: Payer: Self-pay | Admitting: Cardiovascular Disease

## 2023-08-09 DIAGNOSIS — I420 Dilated cardiomyopathy: Secondary | ICD-10-CM

## 2023-08-09 MED ORDER — GADOBUTROL 1 MMOL/ML IV SOLN
10.0000 mL | Freq: Once | INTRAVENOUS | Status: AC | PRN
  Administered 2023-08-09: 10 mL via INTRAVENOUS

## 2023-08-14 DIAGNOSIS — L820 Inflamed seborrheic keratosis: Secondary | ICD-10-CM | POA: Diagnosis not present

## 2023-08-14 DIAGNOSIS — L821 Other seborrheic keratosis: Secondary | ICD-10-CM | POA: Diagnosis not present

## 2023-08-17 ENCOUNTER — Other Ambulatory Visit: Payer: Self-pay | Admitting: Family Medicine

## 2023-08-17 DIAGNOSIS — E785 Hyperlipidemia, unspecified: Secondary | ICD-10-CM

## 2023-08-22 NOTE — Progress Notes (Signed)
 ACUTE VISIT No chief complaint on file.  HPI: [name] is a 68 y.o. female with a PMHx significant for HTN, allergic rhinitis, DM II, and HLD, who is here today complaining of cough.   Review of Systems See other pertinent positives and negatives in HPI.  Current Outpatient Medications on File Prior to Visit  Medication Sig Dispense Refill   albuterol (VENTOLIN HFA) 108 (90 Base) MCG/ACT inhaler TAKE 2 PUFFS BY MOUTH EVERY 6 HOURS AS NEEDED FOR WHEEZE OR SHORTNESS OF BREATH 6.7 each 2   amLODipine (NORVASC) 10 MG tablet TAKE 1 TABLET BY MOUTH EVERY DAY 90 tablet 1   Continuous Glucose Sensor (FREESTYLE LIBRE 14 DAY SENSOR) MISC 1 each by Does not apply route every 14 (fourteen) days. 2 each 11   cyclobenzaprine (FLEXERIL) 10 MG tablet TAKE 1 TABLET BY MOUTH THREE TIMES A DAY AS NEEDED FOR MUSCLE SPASMS 60 tablet 0   dapagliflozin propanediol (FARXIGA) 10 MG TABS tablet Take 1 tablet (10 mg total) by mouth daily before breakfast. 90 tablet 3   dapagliflozin propanediol (FARXIGA) 10 MG TABS tablet Take 1 tablet (10 mg total) by mouth daily before breakfast.     fluticasone (FLONASE) 50 MCG/ACT nasal spray SPRAY 1 SPRAY INTO BOTH NOSTRILS DAILY. 16 mL 3   losartan (COZAAR) 25 MG tablet Take 1 tablet (25 mg total) by mouth daily. 90 tablet 3   losartan (COZAAR) 50 MG tablet TAKE 1 TABLET BY MOUTH EVERY DAY 90 tablet 1   metFORMIN (GLUCOPHAGE-XR) 750 MG 24 hr tablet TAKE 1 TABLET (750 MG TOTAL) BY MOUTH IN THE MORNING AND AT BEDTIME. TAKE 1 TABLET(500 MG) BY MOUTH TWICE DAILY WITH A MEAL 180 tablet 1   metoprolol succinate (TOPROL XL) 50 MG 24 hr tablet Take 1 tablet (50 mg total) by mouth daily. 90 tablet 3   OVER THE COUNTER MEDICATION Benefactor supplement     rosuvastatin (CRESTOR) 10 MG tablet TAKE 1 TABLET BY MOUTH EVERY DAY 90 tablet 1   RYBELSUS 14 MG TABS TAKE 1 TABLET (14 MG TOTAL) BY MOUTH DAILY 30 tablet 11   spironolactone (ALDACTONE) 25 MG tablet Take 1 tablet (25 mg total)  by mouth daily. 90 tablet 3   No current facility-administered medications on file prior to visit.    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   Drug use: No   Sexual activity: Not Currently    Birth control/protection: None  Other Topics Concern   Not on file  Social History Narrative   Work or School: live in caregiver  Home Situation: lives with client several days per week      Spiritual Beliefs: catholic      Lifestyle: exercises 2 days per week; diet is pretty healthy      Social Drivers of Corporate investment banker Strain: Low Risk  (11/07/2022)   Overall Financial Resource Strain (CARDIA)    Difficulty of Paying Living Expenses: Not hard at all  Food Insecurity: No Food Insecurity (11/07/2022)   Hunger Vital Sign    Worried About Running Out of Food in the Last Year: Never true    Ran Out of Food in the Last Year: Never true  Transportation Needs: No Transportation Needs (11/07/2022)   PRAPARE - Administrator, Civil Service (Medical): No    Lack of Transportation (Non-Medical): No  Physical Activity: Inactive (11/07/2022)   Exercise Vital Sign    Days of Exercise per Week: 0 days     Minutes of Exercise per Session: 0 min  Stress: No Stress Concern Present (11/07/2022)   [employer] of Occupational Health - Occupational Stress Questionnaire    Feeling of Stress : Not at all  Social Connections: Moderately Integrated (11/07/2022)   Social Connection and Isolation Panel [NHANES]    Frequency of Communication with Friends and Family: More than three times a week    Frequency of Social Gatherings with Friends and Family: More than three times a week    Attends Religious Services: More than 4 times per year    Active Member of [employer] or Organizations: Yes    Attends Engineer, structural: More than 4 times per year    Marital Status: Never married    There were no vitals filed for this visit. There is no height or weight on file to calculate BMI.  Physical Exam  ASSESSMENT AND PLAN:  [name] was seen today for cough.   There are no diagnoses linked to this encounter.  No follow-ups on file.  I, Rolla Etienne Wierda, acting as a scribe for Palmira Stickle [place], MD., have documented all relevant documentation on the behalf of Hanford Lust [place], MD, as directed by  Otila Starn [place], MD while in the presence of Ardel Jagger [place], MD.   I, Dustyn Dansereau [place], MD, have reviewed all documentation for this visit. The documentation on 08/22/23 for the exam, diagnosis, procedures, and orders are all accurate and complete.  Noel Henandez G. [place], MD  [facility]. [place] office.  Discharge Instructions   None

## 2023-08-23 ENCOUNTER — Ambulatory Visit: Admitting: Family Medicine

## 2023-08-23 ENCOUNTER — Encounter: Payer: Self-pay | Admitting: Family Medicine

## 2023-08-23 VITALS — BP 120/70 | HR 100 | Temp 98.8°F | Resp 16 | Ht 64.0 in | Wt 158.0 lb

## 2023-08-23 DIAGNOSIS — R059 Cough, unspecified: Secondary | ICD-10-CM | POA: Diagnosis not present

## 2023-08-23 MED ORDER — HYDROCODONE BIT-HOMATROP MBR 5-1.5 MG/5ML PO SOLN: 5.0000 mL | Freq: Every evening | ORAL | 0 refills | Status: AC | PRN

## 2023-08-23 MED ORDER — BENZONATATE 100 MG PO CAPS: 100.0000 mg | ORAL_CAPSULE | Freq: Two times a day (BID) | ORAL | 0 refills | Status: AC | PRN

## 2023-08-23 NOTE — Patient Instructions (Addendum)
 A few things to remember from today's visit:  Cough, unspecified type - Plan: benzonatate (TESSALON) 100 MG capsule, HYDROcodone bit-homatropine (HYCODAN) 5-1.5 MG/5ML syrup Most likely because recent viral illness. Lungs were clear today. Plain Mucinex over the counter. Benzonatate during the day and Hycodan at night for up to 10 days. Monitor for fever, shortness of breath, wheezing.  If you need refills for medications you take chronically, please call your pharmacy. Do not use My Chart to request refills or for acute issues that need immediate attention. If you send a my chart message, it may take a few days to be addressed, specially if I am not in the office.  Please be sure medication list is accurate. If a new problem present, please set up appointment sooner than planned today.

## 2023-09-06 NOTE — Progress Notes (Unsigned)
 Cardiology Office Note:  .   Date:  09/07/2023  ID:  Hayley Fox, DOB 1955-12-25, MRN [id number] PCP: Karie Georges, MD  [facility] Providers Cardiologist:  None    History of Present Illness: .    Chief Complaint  Patient presents with   Follow-up         Hayley Fox is a 68 y.o. female with history of CHF, LV noncompaction who presents for follow-up.    History of Present Illness   Hayley Fox is a 68 year old female with non-ischemic cardiomyopathy who presents for follow-up.  Recent cardiac MRI showed an ejection fraction of 36%, consistent with previous measurements, with no evidence of infiltrative CM but concerns for LV noncompaction.  She experiences exertional dyspnea, feeling 'low winded' when walking short distances, such as from her home to her car. She maintains physical activity by choosing to live in apartments with stairs to encourage exercise and reports not getting out of breath as much now.  She is confused about her current medication regimen. She is currently taking metoprolol but is unsure about her use of losartan, which she thought was discontinued due to potassium concerns. She mentions having been taken off Entresto due to a cough. She is currently on metoprolol, spironolactone, and Farxiga, and she has been taking Crestor for her cholesterol.  There is no known family history of heart failure. Her father had diabetes and hypertension, and her mother had hypertension related to alcoholism.        Stop norvasc Start losartan 25 mg daily      Problem List HTN DM -A1c 6.7 3. HLD -T chol 133, HDL 50, LDL 68, TG 76 4. PVCs -7.4% burden 5. Systolic HF/LV non-compaction  -EF 40-45% 01/19/2023 -EF 25-30% 06/08/2023 -EF 36% CMR 08/11/2023 6. Non-obstructive CAD -<25% stenosis -CAC 5 (61st percentile)     ROS: All other ROS reviewed and negative. Pertinent positives noted in the HPI.     Studies Reviewed: Marland Kitchen        CMR  08/11/2023 IMPRESSION: 1. Findings most consistent with noncompaction cardiomyopathy. Prominent LV apical and lateral trabeculations with noncompacted to compacted ratio of 2.6.   2. Mildly dilated left ventricular chamber size with moderately reduced LV systolic function, LVEF 36%. No myocardial edema. Delayed myocardial enhancement in the basal RV insertion points suggests elevated pulmonary pressures.   3. Normal right ventricular chamber size and systolic function, RVEF 52%.   4.  Mild mitral valve regurgitation. Physical Exam:   VS:  BP 122/62 (BP Location: Left Arm, Patient Position: Sitting)   Pulse 85   Ht 5\' 4"  (1.626 m)   Wt 156 lb (70.8 kg)   LMP 07/29/2011   SpO2 97%   BMI 26.78 kg/m    Wt Readings from Last 3 Encounters:  09/07/23 156 lb (70.8 kg)  08/23/23 158 lb (71.7 kg)  06/12/23 164 lb 11.2 oz (74.7 kg)    GEN: Well nourished, well developed in no acute distress NECK: No JVD; No carotid bruits CARDIAC: RRR, no murmurs, rubs, gallops RESPIRATORY:  Clear to auscultation without rales, wheezing or rhonchi  ABDOMEN: Soft, non-tender, non-distended EXTREMITIES:  No edema; No deformity  ASSESSMENT AND PLAN: .   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 Mild exertional dyspnea. Current regimen effective. No ICD indicated. - Discontinue amlodipine. - Initiate losartan 25 mg daily. - Continue metoprolol succinate  50 mg daily. - Continue spironolactone 25 mg daily. - Continue Farxiga 10 mg daily. - Discussed possibility of AC, but will hold at this time.   Premature ventricular contractions (PVCs) PVCs with 7.4% burden, related to cardiomyopathy but not significantly contributing. - Maintain beta blocker therapy.   Nonobstructive CAD - Statin below. No angina.   Type 2 diabetes mellitus Well-controlled with HbA1c 6.4%. - Continue current  diabetic management.  Hyperlipidemia Well-controlled on Crestor with LDL 68 mg/dL. - Continue Crestor 10 mg daily.              Follow-up: Return in about 6 months (around 03/09/2024).   Signed, Lenna Gilford. Flora Lipps, MD, [facility]  [facility]  [address] [place], [place] [address] [phone]  4:00 PM

## 2023-09-07 ENCOUNTER — Encounter: Payer: Self-pay | Admitting: Cardiovascular Disease

## 2023-09-07 ENCOUNTER — Ambulatory Visit: Payer: Medicare Other | Attending: Cardiovascular Disease | Admitting: Cardiovascular Disease

## 2023-09-07 VITALS — BP 122/62 | HR 85 | Ht 64.0 in | Wt 156.0 lb

## 2023-09-07 DIAGNOSIS — I10 Essential (primary) hypertension: Secondary | ICD-10-CM | POA: Diagnosis not present

## 2023-09-07 DIAGNOSIS — I420 Dilated cardiomyopathy: Secondary | ICD-10-CM

## 2023-09-07 DIAGNOSIS — I493 Ventricular premature depolarization: Secondary | ICD-10-CM | POA: Diagnosis not present

## 2023-09-07 DIAGNOSIS — I2510 Atherosclerotic heart disease of native coronary artery without angina pectoris: Secondary | ICD-10-CM

## 2023-09-07 DIAGNOSIS — E782 Mixed hyperlipidemia: Secondary | ICD-10-CM | POA: Diagnosis not present

## 2023-09-07 DIAGNOSIS — I428 Other cardiomyopathies: Secondary | ICD-10-CM

## 2023-09-07 MED ORDER — LOSARTAN POTASSIUM 25 MG PO TABS: 25.0000 mg | ORAL_TABLET | Freq: Every day | ORAL | 3 refills | Status: DC

## 2023-09-07 NOTE — Patient Instructions (Signed)
 Medication Instructions:  - STOP AMLODIPINE  - START LOSARTAN 25 MG BY MOUTH DAILY    *If you need a refill on your cardiac medications before your next appointment, please call your pharmacy*   Lab Work: NONE    If you have labs (blood work) drawn today and your tests are completely normal, you will receive your results only by: MyChart Message (if you have MyChart) OR A paper copy in the mail If you have any lab test that is abnormal or we need to change your treatment, we will call you to review the results.   Testing/Procedures: NONE    Follow-Up: At [facility], you and your health needs are our priority.  As part of our continuing mission to provide you with exceptional heart care, we have created designated Provider Care Teams.  These Care Teams include your primary Cardiologist (physician) and Advanced Practice Providers (APPs -  Physician Assistants and Nurse Practitioners) who all work together to provide you with the care you need, when you need it.  We recommend signing up for the patient portal called "MyChart".  Sign up information is provided on this After Visit Summary.  MyChart is used to connect with patients for Virtual Visits (Telemedicine).  Patients are able to view lab/test results, encounter notes, upcoming appointments, etc.  Non-urgent messages can be sent to your provider as well.   To learn more about what you can do with MyChart, go to [email or web address].    Your next appointment:   6 month(s)  The format for your next appointment:   In Person  Provider:   Lennie Odor, MD     Other Instructions

## 2023-09-20 ENCOUNTER — Other Ambulatory Visit: Payer: Self-pay | Admitting: Family Medicine

## 2023-09-22 ENCOUNTER — Encounter: Payer: Self-pay | Admitting: Family Medicine

## 2023-09-22 ENCOUNTER — Ambulatory Visit: Admitting: Family Medicine

## 2023-09-22 ENCOUNTER — Ambulatory Visit (INDEPENDENT_AMBULATORY_CARE_PROVIDER_SITE_OTHER)

## 2023-09-22 VITALS — BP 130/80 | HR 85 | Temp 98.8°F | Resp 16 | Ht 64.0 in | Wt 156.6 lb

## 2023-09-22 DIAGNOSIS — J309 Allergic rhinitis, unspecified: Secondary | ICD-10-CM

## 2023-09-22 DIAGNOSIS — R053 Chronic cough: Secondary | ICD-10-CM

## 2023-09-22 DIAGNOSIS — I517 Cardiomegaly: Secondary | ICD-10-CM | POA: Diagnosis not present

## 2023-09-22 DIAGNOSIS — I10 Essential (primary) hypertension: Secondary | ICD-10-CM | POA: Diagnosis not present

## 2023-09-22 DIAGNOSIS — R059 Cough, unspecified: Secondary | ICD-10-CM | POA: Diagnosis not present

## 2023-09-22 MED ORDER — BENZONATATE 200 MG PO CAPS: 200.0000 mg | ORAL_CAPSULE | Freq: Two times a day (BID) | ORAL | 0 refills | Status: AC | PRN

## 2023-09-22 MED ORDER — FLUTICASONE PROPIONATE 50 MCG/ACT NA SUSP: 1.0000 | Freq: Every day | NASAL | 0 refills | Status: DC

## 2023-09-22 NOTE — Patient Instructions (Addendum)
 A few things to remember from today's visit:  Cough, persistent - Plan: DG Chest 2 View, benzonatate (TESSALON) 200 MG capsule  Essential hypertension  Allergic rhinitis, unspecified seasonality, unspecified trigger - Plan: fluticasone (FLONASE) 50 MCG/ACT nasal spray Monitor blood pressure at home. Benzonatate 200 mg 2 times daily as needed. Use Flonase nasal spray daily for 10-14 days at bed time.  If you need refills for medications you take chronically, please call your pharmacy. Do not use My Chart to request refills or for acute issues that need immediate attention. If you send a my chart message, it may take a few days to be addressed, specially if I am not in the office.  Please be sure medication list is accurate. If a new problem present, please set up appointment sooner than planned today.

## 2023-09-22 NOTE — Progress Notes (Signed)
 ACUTE VISIT Chief Complaint  Patient presents with   Cough    Medication not working   HPI: [name] is a 68 y.o. female with a PMHx significant for HTN, allergic rhinitis, DM II, and HLD, among some, who is here today complaining of cough.   Patient was seen for 2 weeks of cough on 3/5 and given hydrocodone bit-homatropine 5 mLs for 10 days and benzonatate 100 mg to take twice daily.    She says she is still coughing, especially at night, and is interfering with her sleep. Her cough is still productive, and she endorses occasional nasal congestion and rhinorrhea.  She occasionally uses flonase and saline nasal sprays. She also has an albuterol inhaler she uses for SOB, but only uses it about once per month and has not needed since her last visit. Marland Kitchen  Pertinent negatives include fever, wheezing, SOB, chest pain, hemoptysis,acid reflux, or heartburn.  Former smoker.  Hypertension: Currently on Losartan 25 mg daily and metoprolol succinate 50 mg daily.  Her BP in the office today is elevated at 140/82. She does not check BP at home. Negative for unusual or severe headache, visual changes, exertional chest pain, dyspnea, focal weakness, or edema.  Lab Results  Component Value Date   CREATININE 1.10 06/15/2023   BUN 18 06/15/2023   NA 142 06/15/2023   K 3.9 06/15/2023   CL 106 06/15/2023   CO2 27 06/15/2023   Review of Systems  Constitutional:  Negative for activity change, appetite change, chills and unexpected weight change.  HENT:  Negative for sore throat and trouble swallowing.   Gastrointestinal:  Negative for abdominal pain, nausea and vomiting.  Genitourinary:  Negative for decreased urine volume and hematuria.  Skin:  Negative for rash.  Allergic/Immunologic: Positive for environmental allergies.  Neurological:  Negative for syncope and facial asymmetry.  See other pertinent positives and negatives in HPI.  Current Outpatient Medications on File Prior to Visit   Medication Sig Dispense Refill   albuterol (VENTOLIN HFA) 108 (90 Base) MCG/ACT inhaler TAKE 2 PUFFS BY MOUTH EVERY 6 HOURS AS NEEDED FOR WHEEZE OR SHORTNESS OF BREATH 6.7 each 2   Continuous Glucose Sensor (FREESTYLE LIBRE 14 DAY SENSOR) MISC 1 each by Does not apply route every 14 (fourteen) days. 2 each 11   dapagliflozin propanediol (FARXIGA) 10 MG TABS tablet Take 1 tablet (10 mg total) by mouth daily before breakfast.     losartan (COZAAR) 25 MG tablet Take 1 tablet (25 mg total) by mouth daily. 90 tablet 3   metFORMIN (GLUCOPHAGE-XR) 750 MG 24 hr tablet TAKE 1 TABLET (750 MG TOTAL) BY MOUTH IN THE MORNING AND AT BEDTIME. TAKE 1 TABLET(500 MG) BY MOUTH TWICE DAILY WITH A MEAL 180 tablet 1   metoprolol succinate (TOPROL XL) 50 MG 24 hr tablet Take 1 tablet (50 mg total) by mouth daily. 90 tablet 3   OVER THE COUNTER MEDICATION Benefactor supplement     rosuvastatin (CRESTOR) 10 MG tablet TAKE 1 TABLET BY MOUTH EVERY DAY 90 tablet 1   RYBELSUS 14 MG TABS TAKE 1 TABLET (14 MG TOTAL) BY MOUTH DAILY 30 tablet 11   spironolactone (ALDACTONE) 25 MG tablet Take 1 tablet (25 mg total) by mouth daily. 90 tablet 3   No current facility-administered medications on file prior to visit.   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   Drug use: No   Sexual activity: Not Currently    Birth control/protection: None  Other Topics Concern   Not on file  Social History Narrative   Work or School: live in caregiver      Home Situation: lives with client several days per week      Spiritual Beliefs: catholic      Lifestyle: exercises 2 days per week; diet is pretty healthy      Social Drivers of Corporate investment banker Strain: Low Risk  (11/07/2022)   Overall Financial Resource Strain (CARDIA)    Difficulty of Paying Living Expenses: Not hard at all  Food Insecurity: No Food Insecurity (11/07/2022)   Hunger Vital Sign    Worried About Running Out of Food in the Last Year: Never true    Ran Out of Food in the Last Year: Never true  Transportation Needs: No Transportation Needs (11/07/2022)   PRAPARE - Administrator, Civil Service (Medical): No    Lack of Transportation (Non-Medical): No  Physical Activity: Inactive (11/07/2022)   Exercise Vital Sign    Days of Exercise per Week: 0 days    Minutes of Exercise per Session: 0 min  Stress: No Stress Concern Present (11/07/2022)   [employer] of Occupational Health - Occupational Stress Questionnaire    Feeling of Stress : Not at all  Social Connections: Moderately Integrated (11/07/2022)   Social Connection and Isolation Panel [NHANES]    Frequency of Communication with Friends and Family: More than three times a week    Frequency of Social Gatherings with Friends and Family: More than three times a week    Attends Religious Services: More than 4 times per year    Active Member of Clubs or Organizations: Yes    Attends Banker Meetings: More than 4 times per year    Marital Status: Never married    Vitals:   09/22/23 0932 09/22/23 0948  BP: (!) 140/82 130/80  Pulse: 85   Resp: 16   Temp: 98.8 F (37.1 C)   SpO2: 96%    Body  mass index is 26.88 kg/m.  Physical Exam Vitals and nursing note reviewed.  Constitutional:      General: She is not in acute distress.    Appearance: She is well-developed.  HENT:     Head: Normocephalic and atraumatic.     Mouth/Throat:     Mouth: Mucous membranes are moist.     Pharynx: Oropharynx is clear. Uvula midline.  Eyes:     Conjunctiva/sclera: Conjunctivae normal.  Neck:     Vascular: No JVD.  Cardiovascular:     Rate and Rhythm: Normal rate. Rhythm  irregular. Extrasystoles are present.    Pulses:          Dorsalis pedis pulses are 2+ on the right side and 2+ on the left side.     Heart sounds: No murmur heard. Pulmonary:     Effort: Pulmonary effort is normal. No respiratory distress.     Breath sounds: Normal breath sounds.  Abdominal:     Palpations: Abdomen is soft. There is no hepatomegaly or mass.     Tenderness: There is no abdominal tenderness.  Musculoskeletal:     Right lower leg: No edema.     Left lower leg: No edema.  Lymphadenopathy:     Cervical: No cervical adenopathy.  Skin:    General: Skin is warm.     Findings: No erythema or rash.  Neurological:     General: No focal deficit present.     Mental Status: She is alert and oriented to person, place, and time.     Cranial Nerves: No cranial nerve deficit.     Gait: Gait normal.  Psychiatric:        Mood and Affect: Mood and affect normal.    ASSESSMENT AND PLAN:  Ms. Hayley Fox was seen today for cough.   Cough, persistent We discussed possible etiologies, lung auscultation today normal. She did not feel like benzonatate at 100 mg helped, she would like to try a higher dose. Chest x-ray ordered today. She agrees with trying using Flonase nasal spray daily at bedtime for 10 to 14 days.  If not better, we could consider Symbicort and then a trial of PPI. Further recommendation will be given according with chest x-ray. Follow-up with PCP in 4 weeks, before if needed.  CXR: Negative for  opacities or infiltrates.  -     DG Chest 2 View; Future -     Benzonatate; Take 1 capsule (200 mg total) by mouth 2 (two) times daily as needed for up to 10 days for cough.  Dispense: 20 capsule; Refill: 0  Essential hypertension BP re-checked 130/80. BP during recent visit at her cardiologist office 122/62 on 09/07/2023.  For now continue losartan 25 mg daily, spironolactone 25 mg daily, and metoprolol succinate 50 mg daily. Recommend monitoring BP at home. Noted today irregular HR, she reports history of cardiac arrhythmia, EKG in 10/2022 with PVCs.She has had some palpitations but no more than her usual. Instructed about warning signs.  Allergic rhinitis, unspecified seasonality, unspecified trigger Rhinorrhea and postnasal drainage could be contributing factors for cough. Recommend Flonase nasal spray, initially daily at bedtime for 10 to 14 days then as needed. Nasal saline irrigations as needed.  -     Fluticasone Propionate; Place 1-2 sprays into both nostrils at bedtime.  Dispense: 16 mL; Refill: 0  Return in about 4 weeks (around 10/20/2023) for cough and HTN with PCP.  I, Rolla Etienne Wierda, acting as a scribe for Qusai Kem [place], MD., have documented all relevant documentation on the behalf of Hayley Mehlhoff [place], MD, as directed by  Hayley Wehrly [place], MD while in the presence of Hayley Riga [place], MD.   I, Hermela Hardt [place], MD, have reviewed all documentation for this visit. The documentation on 09/22/23 for the exam, diagnosis, procedures, and orders are all accurate and complete.  La Shehan G. [place], MD  [facility]. [place] office.

## 2023-10-19 ENCOUNTER — Other Ambulatory Visit: Payer: Self-pay | Admitting: Family Medicine

## 2023-10-19 DIAGNOSIS — J309 Allergic rhinitis, unspecified: Secondary | ICD-10-CM

## 2023-10-20 NOTE — Telephone Encounter (Signed)
 Last Rx given by Dr [place]

## 2023-11-14 ENCOUNTER — Other Ambulatory Visit: Payer: Self-pay | Admitting: Family Medicine

## 2023-11-14 DIAGNOSIS — J309 Allergic rhinitis, unspecified: Secondary | ICD-10-CM

## 2023-11-21 ENCOUNTER — Encounter: Payer: Self-pay | Admitting: Family Medicine

## 2023-11-21 ENCOUNTER — Ambulatory Visit (INDEPENDENT_AMBULATORY_CARE_PROVIDER_SITE_OTHER): Admitting: Family Medicine

## 2023-11-21 DIAGNOSIS — Z0000 Encounter for general adult medical examination without abnormal findings: Secondary | ICD-10-CM

## 2023-11-21 NOTE — Progress Notes (Signed)
 PATIENT CHECK-IN and HEALTH RISK ASSESSMENT QUESTIONNAIRE:  -completed by phone/video for upcoming Medicare Preventive Visit    Pre-Visit Check-in: 1)Vitals (height, wt, BP, etc) - record in vitals section for visit on day of visit Request home vitals (wt, BP, etc.) and enter into vitals, THEN update Vital Signs SmartPhrase below at the top of the HPI. See below.  2)Review and Update Medications, Allergies PMH, Surgeries, Social history in Epic 3)Hospitalizations in the last year with date/reason? No   4)Review and Update Care Team (patient's specialists) in Epic 5) Complete PHQ9 in Epic  6) Complete Fall Screening in Epic 7)Review all Health Maintenance Due and order under PCP if not done.  Medicare Wellness Patient Questionnaire:  Answer theses question about your habits: How often do you have a drink containing alcohol?Yes, 1 or 2 x year  How many drinks containing alcohol do you have on a typical day when you are drinking? 1 drink How often do you have six or more drinks on one occasion?Na  Have you ever smoked? Yes  Quit date if applicable? Stopped 48 years ago   How many packs a day do/did you smoke? Less than a pack  Do you use smokeless tobacco?NO  Do you use an illicit drugs?No  On average, how many days per week do you engage in moderate to strenuous exercise (like a brisk walk)?NO  On average, how many minutes do you engage in exercise at this level?Na Are you sexually active? No Number of partners?Na  Typical breakfast:oatmeal or cream of wheat or raisin Bran Typical lunch: beef Stew, loves vegetables Typical dinner same as lunch  Typical snacks: Yogurt, apples, blow pops   Beverages: Diet coke, water   Answer theses question about your everyday activities: Can you perform most household chores?Yes  Are you deaf or have significant trouble hearing?yes - uses some hearing aids and helps. She has had audiology evaluation.  Do you feel that you have a problem with  memory?NO  Do you feel safe at home?Yes  Last dentist visit?2 years ago  8. Do you have any difficulty performing your everyday activities?NO  Are you having any difficulty walking, taking medications on your own, and or difficulty managing daily home needs?NO  Do you have difficulty walking or climbing stairs?NO  Do you have difficulty dressing or bathing?NO  Do you have difficulty doing errands alone such as visiting a doctor's office or shopping?NO  Do you currently have any difficulty preparing food and eating?NO  Do you currently have any difficulty using the toilet?NO  Do you have any difficulty managing your finances?NO  Do you have any difficulties with housekeeping of managing your housekeeping?NO    Do you have Advanced Directives in place (Living Will, Healthcare Power or Attorney)? NO    Last eye Exam and location?Last year, [facility] - has appointment already set up for this year    Do you currently use prescribed or non-prescribed narcotic or opioid pain medications?NO   Do you have a history or close family history of breast, ovarian, tubal or peritoneal cancer or a family member with BRCA (breast cancer susceptibility 1 and 2) gene mutations?NO    Nurse/Assistant Credentials/time stamp: Leah A.Wright CMA 12:05 PM     ----------------------------------------------------------------------------------------------------------------------------------------------------------------------------------------------------------------------  Because this visit was a virtual/telehealth visit, some criteria may be missing or patient reported. Any vitals not documented were not able to be obtained and vitals that have been documented are patient reported.    MEDICARE ANNUAL PREVENTIVE VISIT WITH  PROVIDER: (Welcome to [employer], initial annual wellness or annual wellness exam)  Virtual Visit via Phone Note  I connected with Christeena Krogh on 11/21/23 by phone and verified that I am  speaking with the correct person using two identifiers.She prefers a phone visit.   Location patient: home Location provider:work or home office Persons participating in the virtual visit: patient, provider  Concerns and/or follow up today: reports all is good.    See HM section in Epic for other details of completed HM.    ROS: negative for report of fevers, unintentional weight loss, vision changes, vision loss, hearing loss or change, chest pain, sob, hemoptysis, melena, hematochezia, hematuria, falls, bleeding or bruising, thoughts of suicide or self harm, memory loss  Patient-completed extensive health risk assessment - reviewed and discussed with the patient: See Health Risk Assessment completed with patient prior to the visit either above or in recent phone note. This was reviewed in detailed with the patient today and appropriate recommendations, orders and referrals were placed as needed per Summary below and patient instructions.   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   Qualifier: Diagnosis of  By: Ena Harries MD, Viktoria Gray     Past Surgical History:  Procedure Laterality Date   CESAREAN SECTION     COLONOSCOPY  12/2016   negative screening    Social History   Socioeconomic History   Marital status: Single    Spouse name: Not on file   Number of children: 4   Years of education: Not on file   Highest  education level: Not on file  Occupational History   Occupation: Care Giver  Tobacco Use   Smoking status: Former    Types: Cigarettes   Smokeless tobacco: Never  Vaping Use   Vaping status: Never Used  Substance and Sexual Activity   Alcohol use: Yes    Comment: socially-once a month per pt.   Drug use: No   Sexual activity: Not Currently    Birth control/protection: None  Other Topics Concern   Not on file  Social History Narrative   Work or School: live in caregiver      Home Situation: lives with client several days per week      Spiritual Beliefs: catholic      Lifestyle: exercises 2 days per week; diet is pretty healthy      Social Drivers of Corporate investment banker Strain: Low Risk  (11/07/2022)   Overall Financial Resource Strain (CARDIA)    Difficulty of Paying Living Expenses: Not hard at all  Food Insecurity: No Food Insecurity (11/07/2022)   Hunger Vital Sign    Worried About Running Out of Food in the Last Year: Never true    Ran Out of Food in the Last Year: Never true  Transportation Needs: No Transportation Needs (11/07/2022)   PRAPARE - Transportation    Lack of Transportation (Medical): No    Lack  of Transportation (Non-Medical): No  Physical Activity: Inactive (11/07/2022)   Exercise Vital Sign    Days of Exercise per Week: 0 days    Minutes of Exercise per Session: 0 min  Stress: No Stress Concern Present (11/07/2022)   [employer] of Occupational Health - Occupational Stress Questionnaire    Feeling of Stress : Not at all  Social Connections: Moderately Integrated (11/07/2022)   Social Connection and Isolation Panel [NHANES]    Frequency of Communication with Friends and Family: More than three times a week    Frequency of Social Gatherings with Friends and Family: More than three times a week    Attends Religious Services: More than 4 times per year    Active Member of [employer] or Organizations: Yes    Attends Engineer, structural:  More than 4 times per year    Marital Status: Never married  Intimate Partner Violence: Not At Risk (11/07/2022)   Humiliation, Afraid, Rape, and Kick questionnaire    Fear of Current or Ex-Partner: No    Emotionally Abused: No    Physically Abused: No    Sexually Abused: No    Family History  Problem Relation Age of Onset   Diabetes Father    Hypertension Father    Hypertension Mother    Alcohol abuse Mother    Cirrhosis Mother    Diabetes Brother    High blood pressure Brother    High Cholesterol Brother    Colon cancer Neg Hx     Current Outpatient Medications on File Prior to Visit  Medication Sig Dispense Refill   albuterol  (VENTOLIN  HFA) 108 (90 Base) MCG/ACT inhaler TAKE 2 PUFFS BY MOUTH EVERY 6 HOURS AS NEEDED FOR WHEEZE OR SHORTNESS OF BREATH 6.7 each 2   Continuous Glucose Sensor (FREESTYLE LIBRE 14 DAY SENSOR) MISC 1 each by Does not apply route every 14 (fourteen) days. 2 each 11   dapagliflozin  propanediol (FARXIGA ) 10 MG TABS tablet Take 1 tablet (10 mg total) by mouth daily before breakfast.     fluticasone  (FLONASE ) 50 MCG/ACT nasal spray USE 1-2 SPRAYS IN BOTH NOSTRILS AT BEDTIME 48 mL 1   losartan  (COZAAR ) 25 MG tablet Take 1 tablet (25 mg total) by mouth daily. 90 tablet 3   metFORMIN  (GLUCOPHAGE -XR) 750 MG 24 hr tablet TAKE 1 TABLET (750 MG TOTAL) BY MOUTH IN THE MORNING AND AT BEDTIME. TAKE 1 TABLET(500 MG) BY MOUTH TWICE DAILY WITH A MEAL 180 tablet 1   metoprolol  succinate (TOPROL  XL) 50 MG 24 hr tablet Take 1 tablet (50 mg total) by mouth daily. 90 tablet 3   rosuvastatin  (CRESTOR ) 10 MG tablet TAKE 1 TABLET BY MOUTH EVERY DAY 90 tablet 1   RYBELSUS  14 MG TABS TAKE 1 TABLET (14 MG TOTAL) BY MOUTH DAILY 30 tablet 11   OVER THE COUNTER MEDICATION Benefactor supplement     spironolactone  (ALDACTONE ) 25 MG tablet Take 1 tablet (25 mg total) by mouth daily. 90 tablet 3   No current facility-administered medications on file prior to visit.    No Known  Allergies     Physical Exam Vitals requested from patient and listed below if patient had equipment and was able to obtain at home for this virtual visit: There were no vitals filed for this visit. Estimated body mass index is 26.88 kg/m as calculated from the following:   Height as of 09/22/23: 5\' 4"  (1.626 m).   Weight as of 09/22/23: 156 lb 9.6 oz (71 kg).  EKG (optional): deferred due to virtual visit  GENERAL: alert, oriented, no acute distress detected, full vision exam deferred due to pandemic and/or virtual encounter  PSYCH/NEURO: pleasant and cooperative, no obvious depression or anxiety, speech and thought processing grossly intact, Cognitive function grossly intact  Flowsheet Row Office Visit from 11/21/2023 in [facility]  PHQ-9 Total Score 0           11/21/2023   11:53 AM 09/22/2023    9:24 AM 11/07/2022    9:17 AM 07/12/2022    8:21 AM 01/12/2022    9:14 AM  Depression screen PHQ 2/9  Decreased Interest 0 0 0 0 2  Down, Depressed, Hopeless 0 0 0 0 2  PHQ - 2 Score 0 0 0 0 4  Altered sleeping 0   0 3  Tired, decreased energy 0   0 3  Change in appetite 0   0 2  Feeling bad or failure about yourself  0   0 2  Trouble concentrating 0   0 3  Moving slowly or fidgety/restless 0   0 0  Suicidal thoughts 0   0 0  PHQ-9 Score 0   0 17  Difficult doing work/chores     Not difficult at all       01/31/2022    7:50 AM 07/12/2022    8:22 AM 11/07/2022    9:20 AM 11/21/2023   11:44 AM 11/21/2023   11:52 AM  Fall Risk  Falls in the past year?  0 0 1 0  Was there an injury with Fall?  0 0 0 0  Fall Risk Category Calculator  0 0 1  0  (RETIRED) Patient Fall Risk Level Low fall risk      Patient at Risk for Falls Due to  No Fall Risks No Fall Risks  No Fall Risks  Fall risk Follow up  Falls evaluation completed Falls prevention discussed  Falls evaluation completed     Patient-reported     SUMMARY AND PLAN:  Encounter for Medicare annual  wellness exam   Discussed applicable health maintenance/preventive health measures and advised and referred or ordered per patient preferences: -discussed vaccines due recs and risks, advised can get at the pharmacy and to bring receipt if does for records -bone density test is already scheduled -she reports eye exam is scheduled and agrees to bring copy of report Health Maintenance  Topic Date Due   Pneumonia Vaccine 48+ Years old (1 of 2 - PCV) Never done   Zoster Vaccines- Shingrix (1 of 2) Never done   DEXA SCAN  Never done   DTaP/Tdap/Td (2 - Td or Tdap) 08/28/2021   OPHTHALMOLOGY EXAM  10/15/2022   HEMOGLOBIN A1C  12/11/2023   INFLUENZA VACCINE  01/19/2024   FOOT EXAM  06/11/2024   Diabetic kidney evaluation - eGFR measurement  06/14/2024   Diabetic kidney evaluation - Urine ACR  06/14/2024   Medicare Annual Wellness (AWV)  11/20/2024   MAMMOGRAM  07/03/2025   Colonoscopy  12/20/2026   Hepatitis C Screening  Completed   HPV VACCINES  Aged Out   Meningococcal B Vaccine  Aged Out   COVID-19 Vaccine  Discontinued      Education and counseling on the following was provided based on the above review of health and a plan/checklist for the patient, along with additional information discussed, was provided for the patient in the patient instructions :  -Advised on importance of completing advanced directives,  discussed options for completing and provided information in patient instructions as well -Provided counseling and plan for difficulty hearing  -Advised and counseled on a healthy lifestyle - including the importance of a healthy diet, regular physical activity, social connections and stress management. -Reviewed patient's current diet. Advised and counseled on a whole foods based healthy diet. A summary of a healthy diet was provided in the Patient Instructions.  -reviewed patient's current physical activity level and discussed exercise guidelines for adults. Discussed  community resources and ideas for safe exercise at home to assist in meeting exercise guideline recommendations in a safe and healthy way.  -Advise yearly dental visits at minimum and regular eye exams   Follow up: see patient instructions     Patient Instructions  I really enjoyed getting to talk with you today! I am available on Tuesdays and Thursdays for virtual visits if you have any questions or concerns, or if I can be of any further assistance.   CHECKLIST FROM ANNUAL WELLNESS VISIT:  -Follow up (please call to schedule if not scheduled after visit):   -yearly for annual wellness visit with primary care office  Here is a list of your preventive care/health maintenance measures and the plan for each if any are due:  PLAN For any measures below that may be due:     1. Please bring copy of eye exam to our office.   2. Can get vaccines at the pharmacy. If you do, please bring copy of receipt so that we can update record.   3. Get the bone density test as planned.  Health Maintenance  Topic Date Due   Pneumonia Vaccine 94+ Years old (1 of 2 - PCV) Never done   Zoster Vaccines- Shingrix (1 of 2) Never done   DEXA SCAN  Never done   DTaP/Tdap/Td (2 - Td or Tdap) 08/28/2021   OPHTHALMOLOGY EXAM  10/15/2022   Medicare Annual Wellness (AWV)  11/07/2023   HEMOGLOBIN A1C  12/11/2023   INFLUENZA VACCINE  01/19/2024   FOOT EXAM  06/11/2024   Diabetic kidney evaluation - eGFR measurement  06/14/2024   Diabetic kidney evaluation - Urine ACR  06/14/2024   MAMMOGRAM  07/03/2025   Colonoscopy  12/20/2026   Hepatitis C Screening  Completed   HPV VACCINES  Aged Out   Meningococcal B Vaccine  Aged Out   COVID-19 Vaccine  Discontinued    -See a dentist at least yearly  -Get your eyes checked and then per your eye specialist's recommendations  -Other issues addressed today:   -I have included below further information regarding a healthy whole foods based diet, physical  activity guidelines for adults, stress management and opportunities for social connections. I hope you find this information useful.   -----------------------------------------------------------------------------------------------------------------------------------------------------------------------------------------------------------------------------------------------------------    NUTRITION: -eat real food: lots of colorful vegetables (half the plate) and fruits -5-7 servings of vegetables and fruits per day (fresh or steamed is best), exp. 2 servings of vegetables with lunch and dinner and 2 servings of fruit per day. Berries and greens such as kale and collards are great choices.  -consume on a regular basis:  fresh fruits, fresh veggies, fish, nuts, seeds, healthy oils (such as olive oil, avocado oil), whole grains (make sure for bread/pasta/crackers/etc., that the first ingredient on label contains the word "whole"), legumes. -can eat small amounts of dairy and lean meat (no larger than the palm of your hand), but avoid processed meats such as ham, bacon, lunch meat, etc. -drink water -try  to avoid fast food and pre-packaged foods, processed meat, ultra processed foods/beverages (donuts, candy, etc.) -most experts advise limiting sodium to < 2300mg  per day, should limit further is any chronic conditions such as high blood pressure, heart disease, diabetes, etc. The American Heart Association advised that < 1500mg  is is ideal -try to avoid foods/beverages that contain any ingredients with names you do not recognize  -try to avoid foods/beverages  with added sugar or sweeteners/sweets  -try to avoid sweet drinks (including diet drinks): soda, juice, Gatorade, sweet tea, power drinks, diet drinks -try to avoid white rice, white bread, pasta (unless whole grain)  EXERCISE GUIDELINES FOR ADULTS: -if you wish to increase your physical activity, do so gradually and with the approval of your  doctor -STOP and seek medical care immediately if you have any chest pain, chest discomfort or trouble breathing when starting or increasing exercise  -move and stretch your body, legs, feet and arms when sitting for long periods -Physical activity guidelines for optimal health in adults: -get at least 150 minutes per week of moderate exercise (can talk, but not sing); this is about 20-30 minutes of sustained activity 5-7 days per week or two 10-15 minute episodes of sustained activity 5-7 days per week -do some muscle building/resistance training/strength training at least 2 days per week  -balance exercises 3+ days per week:   Stand somewhere where you have something sturdy to hold onto if you lose balance    1) lift up on toes, then back down, start with 5x per day and work up to 20x   2) stand and lift one leg straight out to the side so that foot is a few inches of the floor, start with 5x each side and work up to 20x each side   3) stand on one foot, start with 5 seconds each side and work up to 20 seconds on each side  If you need ideas or help with getting more active:  -Silver sneakers https://tools.silversneakers.com  -Walk with a Doc: http://www.duncan-williams.com/  -try to include resistance (weight lifting/strength building) and balance exercises twice per week: or the following link for ideas: http://castillo-powell.com/  BuyDucts.dk  STRESS MANAGEMENT: -can try meditating, or just sitting quietly with deep breathing while intentionally relaxing all parts of your body for 5 minutes daily -if you need further help with stress, anxiety or depression please follow up with your primary doctor or contact the wonderful folks at [facility]: [phone]  SOCIAL CONNECTIONS: -options in [place] if you wish to engage in more social and exercise related activities:  -Silver  sneakers https://tools.silversneakers.com  -Walk with a Doc: http://www.duncan-williams.com/  -Check out the [facility] Active Adults 50+ section on the [place] of [employer] (hiking clubs, book clubs, cards and games, chess, exercise classes, aquatic classes and much more) - see the website for details: https://www.[place].gov/departments/parks-recreation/active-adults50  -YouTube has lots of exercise videos for different ages and abilities as well  -[facility] (a variety of indoor and outdoor inperson activities for adults). [phone]. [address]  -Virtual Online Classes (a variety of topics): see seniorplanet.org or call 4351594100  -consider volunteering at a school, hospice center, church, senior center or elsewhere   ADVANCED HEALTHCARE DIRECTIVES:  [place] Advanced Directives assistance:   ExpressWeek.com.cy  Everyone should have advanced health care directives in place. This is so that you get the care you want, should you ever be in a situation where you are unable to make your own medical decisions.  From the [place] Advanced Directive Website: "Advance Health Care Directives are legal documents in which you give written instructions about your health care if, in the future, you cannot speak for yourself.   A health care power of attorney allows you to name a person you trust to make your health care decisions if you cannot make them yourself. A declaration of a desire for a natural death (or living will) is document, which states that you desire not to have your life prolonged by extraordinary measures if you have a terminal or incurable illness or if you are in a vegetative state. An advance instruction for mental health treatment makes a declaration of instructions, information and preferences regarding your mental health treatment. It also states that you are aware that the advance  instruction authorizes a mental health treatment provider to act according to your wishes. It may also outline your consent or refusal of mental health treatment. A declaration of an anatomical gift allows anyone over the age of 80 to make a gift by will, organ donor card or other document."   Please see the following website or an elder law attorney for forms, FAQs and for completion of advanced directives: [place]  Print production planner Health Care Directives Advance Health Care Directives (http://guzman.com/)  Or copy and paste the following to your web browser: PoshChat.[email or web address]           Maurie Southern, DO

## 2023-11-21 NOTE — Progress Notes (Signed)
 Patient was unable to self-report due to a lack of equipment at home via telehealth

## 2023-11-21 NOTE — Patient Instructions (Addendum)
 I really enjoyed getting to talk with you today! I am available on Tuesdays and Thursdays for virtual visits if you have any questions or concerns, or if I can be of any further assistance.   CHECKLIST FROM ANNUAL WELLNESS VISIT:  -Follow up (please call to schedule if not scheduled after visit):   -yearly for annual wellness visit with primary care office  Here is a list of your preventive care/health maintenance measures and the plan for each if any are due:  PLAN For any measures below that may be due:     1. Please bring copy of eye exam to our office.   2. Can get vaccines at the pharmacy. If you do, please bring copy of receipt so that we can update record.   3. Get the bone density test as planned.  Health Maintenance  Topic Date Due   Pneumonia Vaccine 50+ Years old (1 of 2 - PCV) Never done   Zoster Vaccines- Shingrix (1 of 2) Never done   DEXA SCAN  Never done   DTaP/Tdap/Td (2 - Td or Tdap) 08/28/2021   OPHTHALMOLOGY EXAM  10/15/2022   Medicare Annual Wellness (AWV)  11/07/2023   HEMOGLOBIN A1C  12/11/2023   INFLUENZA VACCINE  01/19/2024   FOOT EXAM  06/11/2024   Diabetic kidney evaluation - eGFR measurement  06/14/2024   Diabetic kidney evaluation - Urine ACR  06/14/2024   MAMMOGRAM  07/03/2025   Colonoscopy  12/20/2026   Hepatitis C Screening  Completed   HPV VACCINES  Aged Out   Meningococcal B Vaccine  Aged Out   COVID-19 Vaccine  Discontinued    -See a dentist at least yearly  -Get your eyes checked and then per your eye specialist's recommendations  -Other issues addressed today:   -I have included below further information regarding a healthy whole foods based diet, physical activity guidelines for adults, stress management and opportunities for social connections. I hope you find this information useful.    -----------------------------------------------------------------------------------------------------------------------------------------------------------------------------------------------------------------------------------------------------------    NUTRITION: -eat real food: lots of colorful vegetables (half the plate) and fruits -5-7 servings of vegetables and fruits per day (fresh or steamed is best), exp. 2 servings of vegetables with lunch and dinner and 2 servings of fruit per day. Berries and greens such as kale and collards are great choices.  -consume on a regular basis:  fresh fruits, fresh veggies, fish, nuts, seeds, healthy oils (such as olive oil, avocado oil), whole grains (make sure for bread/pasta/crackers/etc., that the first ingredient on label contains the word "whole"), legumes. -can eat small amounts of dairy and lean meat (no larger than the palm of your hand), but avoid processed meats such as ham, bacon, lunch meat, etc. -drink water -try to avoid fast food and pre-packaged foods, processed meat, ultra processed foods/beverages (donuts, candy, etc.) -most experts advise limiting sodium to < 2300mg  per day, should limit further is any chronic conditions such as high blood pressure, heart disease, diabetes, etc. The American Heart Association advised that < 1500mg  is is ideal -try to avoid foods/beverages that contain any ingredients with names you do not recognize  -try to avoid foods/beverages  with added sugar or sweeteners/sweets  -try to avoid sweet drinks (including diet drinks): soda, juice, Gatorade, sweet tea, power drinks, diet drinks -try to avoid white rice, white bread, pasta (unless whole grain)  EXERCISE GUIDELINES FOR ADULTS: -if you wish to increase your physical activity, do so gradually and with the approval of your doctor -STOP and  seek medical care immediately if you have any chest pain, chest discomfort or trouble breathing when starting or  increasing exercise  -move and stretch your body, legs, feet and arms when sitting for long periods -Physical activity guidelines for optimal health in adults: -get at least 150 minutes per week of moderate exercise (can talk, but not sing); this is about 20-30 minutes of sustained activity 5-7 days per week or two 10-15 minute episodes of sustained activity 5-7 days per week -do some muscle building/resistance training/strength training at least 2 days per week  -balance exercises 3+ days per week:   Stand somewhere where you have something sturdy to hold onto if you lose balance    1) lift up on toes, then back down, start with 5x per day and work up to 20x   2) stand and lift one leg straight out to the side so that foot is a few inches of the floor, start with 5x each side and work up to 20x each side   3) stand on one foot, start with 5 seconds each side and work up to 20 seconds on each side  If you need ideas or help with getting more active:  -Silver sneakers https://tools.silversneakers.com  -Walk with a Doc: http://www.duncan-williams.com/  -try to include resistance (weight lifting/strength building) and balance exercises twice per week: or the following link for ideas: http://castillo-powell.com/  BuyDucts.dk  STRESS MANAGEMENT: -can try meditating, or just sitting quietly with deep breathing while intentionally relaxing all parts of your body for 5 minutes daily -if you need further help with stress, anxiety or depression please follow up with your primary doctor or contact the wonderful folks at [facility]: [phone]  SOCIAL CONNECTIONS: -options in [place] if you wish to engage in more social and exercise related activities:  -Silver sneakers https://tools.silversneakers.com  -Walk with a Doc: http://www.duncan-williams.com/  -Check out the [facility] Active Adults 50+  section on the [place] [employer] (hiking clubs, book clubs, cards and games, chess, exercise classes, aquatic classes and much more) - see the website for details: https://www.[place].gov/departments/parks-recreation/active-adults50  -YouTube has lots of exercise videos for different ages and abilities as well  -[facility] (a variety of indoor and outdoor inperson activities for adults). ([phone]. [address]  -Virtual Online Classes (a variety of topics): see seniorplanet.org or call [phone]  -consider volunteering at a school, hospice center, church, senior center or elsewhere   ADVANCED HEALTHCARE DIRECTIVES:  [facility] Advanced Directives assistance:   ExpressWeek.com.cy  Everyone should have advanced health care directives in place. This is so that you get the care you want, should you ever be in a situation where you are unable to make your own medical decisions.   From the [employer] Advanced Directive Website: "Advance Health Care Directives are legal documents in which you give written instructions about your health care if, in the future, you cannot speak for yourself.   A health care power of attorney allows you to name a person you trust to make your health care decisions if you cannot make them yourself. A declaration of a desire for a natural death (or living will) is document, which states that you desire not to have your life prolonged by extraordinary measures if you have a terminal or incurable illness or if you are in a vegetative state. An advance instruction for mental health treatment makes a declaration of instructions, information and preferences regarding your mental health treatment. It also states that you are aware  that the advance instruction authorizes a mental health treatment provider to act according to your wishes. It may also outline your consent or refusal of mental  health treatment. A declaration of an anatomical gift allows anyone over the age of 54 to make a gift by will, organ donor card or other document."   Please see the following website or an elder law attorney for forms, FAQs and for completion of advanced directives: [place]  Print production planner Health Care Directives Advance Health Care Directives (http://guzman.[email or web address]/)  Or copy and paste the following to your web browser: PoshChat.[email or web address]

## 2023-12-06 DIAGNOSIS — E119 Type 2 diabetes mellitus without complications: Secondary | ICD-10-CM | POA: Diagnosis not present

## 2023-12-18 ENCOUNTER — Other Ambulatory Visit: Payer: Self-pay | Admitting: Cardiovascular Disease

## 2023-12-18 DIAGNOSIS — R002 Palpitations: Secondary | ICD-10-CM

## 2023-12-18 DIAGNOSIS — I493 Ventricular premature depolarization: Secondary | ICD-10-CM

## 2024-01-01 DIAGNOSIS — H43823 Vitreomacular adhesion, bilateral: Secondary | ICD-10-CM | POA: Diagnosis not present

## 2024-01-01 DIAGNOSIS — H2513 Age-related nuclear cataract, bilateral: Secondary | ICD-10-CM | POA: Diagnosis not present

## 2024-01-01 DIAGNOSIS — H43813 Vitreous degeneration, bilateral: Secondary | ICD-10-CM | POA: Diagnosis not present

## 2024-01-01 DIAGNOSIS — E119 Type 2 diabetes mellitus without complications: Secondary | ICD-10-CM | POA: Diagnosis not present

## 2024-01-01 DIAGNOSIS — H35033 Hypertensive retinopathy, bilateral: Secondary | ICD-10-CM | POA: Diagnosis not present

## 2024-01-01 DIAGNOSIS — H53021 Refractive amblyopia, right eye: Secondary | ICD-10-CM | POA: Diagnosis not present

## 2024-02-07 ENCOUNTER — Other Ambulatory Visit: Payer: Medicare Other

## 2024-02-21 ENCOUNTER — Other Ambulatory Visit: Payer: Self-pay | Admitting: Cardiovascular Disease

## 2024-02-21 ENCOUNTER — Telehealth: Payer: Self-pay

## 2024-02-21 ENCOUNTER — Other Ambulatory Visit: Payer: Self-pay | Admitting: Family Medicine

## 2024-02-21 DIAGNOSIS — J309 Allergic rhinitis, unspecified: Secondary | ICD-10-CM

## 2024-02-21 NOTE — Progress Notes (Signed)
   02/21/2024  Patient ID: Hayley Fox, female   DOB: Sep 15, 1955, 68 y.o.   MRN: [id number]  Pharmacy Quality Measure Review  This patient is appearing on a report for being at risk of failing the adherence measure for cholesterol (statin) medications this calendar year.   Medication: Rosuvastatin  10mg  Last fill date: 09/21/23 for 90 day supply  Contacted pharmacy to facilitate refills. Patient aware and will pick up. Also scheduled 6 month follow up with PCP that was past due  Jon VEAR Lindau, PharmD Clinical Pharmacist [phone]

## 2024-02-26 ENCOUNTER — Encounter: Payer: Self-pay | Admitting: Family Medicine

## 2024-02-26 ENCOUNTER — Ambulatory Visit (INDEPENDENT_AMBULATORY_CARE_PROVIDER_SITE_OTHER): Admitting: Family Medicine

## 2024-02-26 VITALS — BP 110/78 | HR 90 | Temp 97.9°F | Ht 64.0 in | Wt 157.6 lb

## 2024-02-26 DIAGNOSIS — E785 Hyperlipidemia, unspecified: Secondary | ICD-10-CM

## 2024-02-26 DIAGNOSIS — J309 Allergic rhinitis, unspecified: Secondary | ICD-10-CM

## 2024-02-26 DIAGNOSIS — E119 Type 2 diabetes mellitus without complications: Secondary | ICD-10-CM

## 2024-02-26 DIAGNOSIS — Z7984 Long term (current) use of oral hypoglycemic drugs: Secondary | ICD-10-CM

## 2024-02-26 LAB — COMPREHENSIVE METABOLIC PANEL WITH GFR
ALT: 15 U/L (ref 0–35)
AST: 16 U/L (ref 0–37)
Albumin: 4.5 g/dL (ref 3.5–5.2)
Alkaline Phosphatase: 80 U/L (ref 39–117)
BUN: 23 mg/dL (ref 6–23)
CO2: 28 meq/L (ref 19–32)
Calcium: 9.6 mg/dL (ref 8.4–10.5)
Chloride: 105 meq/L (ref 96–112)
Creatinine, Ser: 0.87 mg/dL (ref 0.40–1.20)
GFR: 68.51 mL/min (ref >60.00)
Glucose, Bld: 131 mg/dL — ABNORMAL HIGH (ref 70–99)
Potassium: 4.3 meq/L (ref 3.5–5.1)
Sodium: 141 meq/L (ref 135–145)
Total Bilirubin: 0.7 mg/dL (ref 0.2–1.2)
Total Protein: 7.1 g/dL (ref 6.0–8.3)

## 2024-02-26 LAB — LIPID PANEL
Cholesterol: 163 mg/dL (ref 0–200)
HDL: 56.3 mg/dL (ref >39.00)
NonHDL: 106.62
Total CHOL/HDL Ratio: 3
Triglycerides: 401 mg/dL — ABNORMAL HIGH (ref 0.0–149.0)
VLDL: 80.2 mg/dL — ABNORMAL HIGH (ref 0.0–40.0)

## 2024-02-26 LAB — POCT GLYCOSYLATED HEMOGLOBIN (HGB A1C): Hemoglobin A1C: 6.4 % — AB (ref 4.0–5.6)

## 2024-02-26 LAB — HM DIABETES EYE EXAM

## 2024-02-26 LAB — LDL CHOLESTEROL, DIRECT: Direct LDL: 69 mg/dL

## 2024-02-26 LAB — MICROALBUMIN / CREATININE URINE RATIO
Creatinine,U: 89.2 mg/dL
Microalb Creat Ratio: 9.2 mg/g (ref 0.0–30.0)
Microalb, Ur: 0.8 mg/dL (ref 0.0–1.9)

## 2024-02-26 MED ORDER — RYBELSUS 14 MG PO TABS: 14.0000 mg | ORAL_TABLET | Freq: Every day | ORAL | 11 refills | Status: AC

## 2024-02-26 MED ORDER — ROSUVASTATIN CALCIUM 10 MG PO TABS: 10.0000 mg | ORAL_TABLET | Freq: Every day | ORAL | 1 refills | Status: AC

## 2024-02-26 MED ORDER — FLUTICASONE PROPIONATE 50 MCG/ACT NA SUSP: NASAL | 1 refills | Status: AC

## 2024-02-26 MED ORDER — FREESTYLE LIBRE 14 DAY SENSOR MISC: 1.0000 | 11 refills | Status: AC

## 2024-02-26 MED ORDER — METFORMIN HCL ER 750 MG PO TB24
750.0000 mg | ORAL_TABLET | Freq: Two times a day (BID) | ORAL | 1 refills | Status: AC
Start: 2024-02-26 — End: ?

## 2024-02-26 NOTE — Progress Notes (Signed)
 Established Patient Office Visit  Subjective   Patient ID: Hayley Fox, female    DOB: 10-10-1955  Age: 68 y.o. MRN: [id number]  Chief Complaint  Patient presents with   Medical Management of Chronic Issues     Discussed the use of AI scribe software for clinical note transcription with the patient, who gave verbal consent to proceed.  History of Present Illness   Hayley Fox is a 68 year old female with diabetes who presents for medication refills and routine lab work.  She requires refills for her cholesterol medication, Rybelsus , metformin , and Flonase . She recently picked up a 90-day supply of Farxiga , prescribed by her cardiologist. Her diabetes management includes Rybelsus  and metformin , and her A1c is currently 6.4. The diabetes medication has helped reduce her appetite, and others have commented on her weight loss. She uses the [facility] for glucose monitoring, and Medicare has resumed coverage for it.  She has been experiencing memory issues over the past few weeks, such as forgetting if she has taken her medication and forgetting destinations while driving. She recalls a recent incident where she forgot she was going to a farmer's market with a friend. She reports having undergone a dementia screening a year ago and participated in another screening today.  She mentions symptoms of nasal congestion and phlegm in the throat, suggesting a possible cold. No numbness, burning, or tingling in her feet and no new symptoms or problems.     HTN -- BP in office performed and is well controlled. She  reports no side effects to the medications, no chest pain, SOB, dizziness or headaches. She has a BP cuff at home and is checking BP regularly, reports they are in the normal range.     Current Outpatient Medications  Medication Instructions   albuterol  (VENTOLIN  HFA) 108 (90 Base) MCG/ACT inhaler TAKE 2 PUFFS BY MOUTH EVERY 6 HOURS AS NEEDED FOR WHEEZE OR SHORTNESS OF BREATH    Continuous Glucose Sensor (FREESTYLE LIBRE 14 DAY SENSOR) MISC 1 each, Does not apply, Every 14 days   dapagliflozin  propanediol (FARXIGA ) 10 mg, Oral, Daily before breakfast   fluticasone  (FLONASE ) 50 MCG/ACT nasal spray USE 1-2 SPRAYS IN BOTH NOSTRILS AT BEDTIME   losartan  (COZAAR ) 25 mg, Oral, Daily   metFORMIN  (GLUCOPHAGE -XR) 750 mg, Oral, 2 times daily, TAKE 1 TABLET(500 MG) BY MOUTH TWICE DAILY WITH A MEAL   metoprolol  succinate (TOPROL -XL) 50 mg, Oral, Daily   rosuvastatin  (CRESTOR ) 10 mg, Oral, Daily   Rybelsus  14 mg, Oral, Daily   spironolactone  (ALDACTONE ) 25 mg, Oral, Daily    Patient Active Problem List   Diagnosis Date Noted   Vaginal candidiasis 01/12/2022   Fatigue 01/12/2022   Pain of left sacroiliac joint 01/20/2021   Diabetes mellitus type II, non insulin dependent (HCC) 10/09/2015   Hyperlipemia 10/09/2015   Allergic rhinitis 04/18/2013   Essential hypertension 12/03/2008     Review of Systems  All other systems reviewed and are negative.     Objective:     BP 110/78   Pulse 90   Temp 97.9 F (36.6 C) (Oral)   Ht 5' 4 (1.626 m)   Wt 157 lb 9.6 oz (71.5 kg)   LMP 07/29/2011   SpO2 96%   BMI 27.05 kg/m    Physical Exam Vitals reviewed.  Constitutional:      Appearance: Normal appearance. She is well-groomed and normal weight.  Eyes:     Conjunctiva/sclera: Conjunctivae normal.  Neck:  Thyroid : No thyromegaly.  Cardiovascular:     Rate and Rhythm: Normal rate and regular rhythm.     Pulses: Normal pulses.     Heart sounds: S1 normal and S2 normal.  Pulmonary:     Effort: Pulmonary effort is normal.     Breath sounds: Normal breath sounds and air entry. No wheezing.  Abdominal:     General: Bowel sounds are normal.  Musculoskeletal:     Right lower leg: No edema.     Left lower leg: No edema.  Neurological:     Mental Status: She is alert and oriented to person, place, and time. Mental status is at baseline.     Gait: Gait is intact.   Psychiatric:        Mood and Affect: Mood and affect normal.        Speech: Speech normal.        Behavior: Behavior normal.        Judgment: Judgment normal.       02/26/2024    2:21 PM 11/07/2022    9:21 AM 11/03/2021    9:19 AM  6CIT Screen  What Year? 0 points 0 points 0 points  What month? 0 points 0 points 0 points  What time? 0 points 0 points 0 points  Count back from 20 0 points 0 points 0 points  Months in reverse 0 points 0 points 0 points  Repeat phrase 4 points 0 points 0 points  Total Score 4 points 0 points 0 points   Diabetic Foot Exam - Simple   Simple Foot Form Diabetic Foot exam was performed with the following findings: Yes 02/26/2024  2:31 PM  Visual Inspection No deformities, no ulcerations, no other skin breakdown bilaterally: Yes Sensation Testing Intact to touch and monofilament testing bilaterally: Yes Pulse Check Posterior Tibialis and Dorsalis pulse intact bilaterally: Yes Comments        Results for orders placed or performed in visit on 02/26/24  POC HgB A1c  Result Value Ref Range   Hemoglobin A1C 6.4 (A) 4.0 - 5.6 %   HbA1c POC (<> result, manual entry)     HbA1c, POC (prediabetic range)     HbA1c, POC (controlled diabetic range)        The 10-year ASCVD risk score (Arnett DK, et al., 2019) is: 12.6%    Assessment & Plan:  Diabetes mellitus type II, non insulin dependent (HCC) -     POCT glycosylated hemoglobin (Hb A1C) -     Collection capillary blood specimen -     FreeStyle Libre 14 Day Sensor; 1 each by Does not apply route every 14 (fourteen) days.  Dispense: 2 each; Refill: 11 -     metFORMIN  HCl ER; Take 1 tablet (750 mg total) by mouth in the morning and at bedtime. TAKE 1 TABLET(500 MG) BY MOUTH TWICE DAILY WITH A MEAL  Dispense: 180 tablet; Refill: 1 -     Rybelsus ; Take 1 tablet (14 mg total) by mouth daily.  Dispense: 30 tablet; Refill: 11 -     Microalbumin / creatinine urine ratio; Future -     Comprehensive metabolic  panel with GFR; Future  Allergic rhinitis, unspecified seasonality, unspecified trigger -     Fluticasone  Propionate; USE 1-2 SPRAYS IN BOTH NOSTRILS AT BEDTIME  Dispense: 48 mL; Refill: 1  Hyperlipidemia, unspecified hyperlipidemia type -     Rosuvastatin  Calcium ; Take 1 tablet (10 mg total) by mouth daily.  Dispense: 90 tablet; Refill:  1 -     Lipid panel; Future    Assessment and Plan    Type 2 diabetes mellitus Diabetes well-controlled with A1c of 6.4. No neuropathy symptoms. Managed with Rybelsus , metformin , Farxiga , and Freestyle Libre 3 Plus. - Refill Rybelsus , metformin , and Flonase . - Perform foot exam today. - Order labs including metabolic panel. - Post lab results on MyChart. - Advise to notify if [facility] prescription needs adjustment.  Hyperlipidemia Medication refills addressed. - Refill cholesterol medication.  Mild memory loss Recent onset mild memory loss. Dementia screening showed mild issues. Behavioral strategies recommended. - Perform dementia screening today. - Recommend behavioral strategies such as making lists and using post-it notes. - Advise on brain exercises like crossword puzzles and Sudoku. - Monitor memory and refer to neurologist if symptoms worsen.  Allergic rhinitis No new symptoms. - Refill Flonase .  General Health Maintenance Discussed flu and pneumonia vaccines. Explained importance for diabetics. She is hesitant about flu shot. - Recommend flu shot and pneumonia vaccine (Prevnar 20). - Advise to consider getting vaccines at a pharmacy.         Return in about 6 months (around 08/25/2024) for DM.    Heron CHRISTELLA Sharper, MD

## 2024-02-26 NOTE — Patient Instructions (Signed)
 Pneumonia vaccine is due and also the flu shot is due

## 2024-02-29 ENCOUNTER — Encounter: Payer: Self-pay | Admitting: Cardiovascular Disease

## 2024-03-04 ENCOUNTER — Ambulatory Visit: Payer: Self-pay | Admitting: Family Medicine

## 2024-03-14 ENCOUNTER — Ambulatory Visit (HOSPITAL_BASED_OUTPATIENT_CLINIC_OR_DEPARTMENT_OTHER)
Admission: RE | Admit: 2024-03-14 | Discharge: 2024-03-14 | Disposition: A | Discharge status: Home / Self Care | Source: Ambulatory Visit | Attending: Family Medicine | Admitting: Family Medicine

## 2024-03-14 DIAGNOSIS — Z780 Asymptomatic menopausal state: Secondary | ICD-10-CM | POA: Diagnosis not present

## 2024-03-14 DIAGNOSIS — M8588 Other specified disorders of bone density and structure, other site: Secondary | ICD-10-CM | POA: Diagnosis not present

## 2024-03-15 ENCOUNTER — Ambulatory Visit: Payer: Self-pay | Admitting: Family Medicine

## 2024-03-22 ENCOUNTER — Ambulatory Visit: Payer: Self-pay

## 2024-03-22 NOTE — Telephone Encounter (Signed)
 Patient going to Urgent Care and also was advised to take her blood pressure cuff with her to calibrate it and make sure the readings are what are obtained at the Urgent Care   FYI Only or Action Required?: FYI only for provider.  Patient was last seen in primary care on 02/26/2024 by Ozell Heron HERO, MD.  Called Nurse Triage reporting Hypertension.  Symptoms began patient noticed her blood pressure being elevated a few days ago.  Interventions attempted: Nothing.  Symptoms are: patient has noticed her blood pressure being elevated the past few days.  Triage Disposition: See Physician Within 4 Hours (or PCP Triage) (overriding See Physician Within 24 Hours)  Patient/caregiver understands and will follow disposition?: Yes---patient going to Urgent Care at this time               Copied from CRM #[id number]. Topic: Clinical - Red Word Triage >> Mar 22, 2024  3:33 PM Drema MATSU wrote: Red Word that prompted transfer to Nurse Triage: Patient checked her blood pressure readings for for the past few days and it has been fluctuating. Patient wants to know should she be worried about that. 10/03 171/105 159/110 10/02 183/116 10/01171/105  09/30 190/115 Reason for Disposition  Systolic BP >= 180 OR Diastolic >= 110  Answer Assessment - Initial Assessment Questions 159/110 3:30pm today 03/22/2024  15 min prior to speaking with this Triage RN          165/112   3:47pm today 03/22/2024  10/03 171/105 159/110 10/02 183/116 10/01 171/105  09/30 190/115     Patient states that she doesn't know of anything major that has changed and states that she works a lot so maybe she doesn't notice anything different Denies dizziness, chest pain, headaches, or any other symptoms Patient asked if her blood pressure cuff could be wrong. This RN advised her with it being high the past few days and with her not experiencing any symptoms and taking her medication correctly---this RN still  advised that she be evaluated today---with no openings at her PCP office or the surrounding offices within the region, Urgent Care is recommended. Patient is familiar with surrounding Urgent Cares and will find the closest one to her. She is advised to take her cuff with her to make sure it matches what it obtained at the Urgent Care today Patient is advised that if anything worsens to go to the Emergency Room. Patient verbalized understanding.    1. BLOOD PRESSURE: What is your blood pressure? Did you take at least two measurements 5 minutes apart?     159/110 3:30pm today 03/22/2024  15 min prior to speaking with this Triage RN          165/112   3:47pm today 03/22/2024   3. HOW: How did you take your blood pressure? (e.g., automatic home BP monitor, visiting nurse)     Automatic wrist cuff 4. HISTORY: Do you have a history of high blood pressure?     yes 5. MEDICINES: Are you taking any medicines for blood pressure? Have you missed any doses recently?     Yes taking medication and has not missed any doses 6. OTHER SYMPTOMS: Do you have any symptoms? (e.g., blurred vision, chest pain, difficulty breathing, headache, weakness)     Patient denies any symptoms  Protocols used: Blood Pressure - High-A-AH

## 2024-03-25 NOTE — Telephone Encounter (Signed)
 Left a message for the patient to return my call.

## 2024-03-25 NOTE — Telephone Encounter (Signed)
 Have her increase her losartan  to 50 mg (2 tablets of the 25 mg) daily and have her schedule with me in the office in 2 weeks

## 2024-03-25 NOTE — Telephone Encounter (Signed)
 Patient informed of the instructions below and an appt was scheduled on 10/21.

## 2024-04-09 ENCOUNTER — Encounter: Payer: Self-pay | Admitting: Family Medicine

## 2024-04-09 ENCOUNTER — Ambulatory Visit (INDEPENDENT_AMBULATORY_CARE_PROVIDER_SITE_OTHER): Admitting: Family Medicine

## 2024-04-09 VITALS — BP 110/80 | HR 85 | Temp 98.7°F | Ht 64.0 in | Wt 163.4 lb

## 2024-04-09 DIAGNOSIS — I10 Essential (primary) hypertension: Secondary | ICD-10-CM | POA: Diagnosis not present

## 2024-04-09 MED ORDER — BLOOD PRESSURE KIT DEVI
1.0000 | Freq: Once | 0 refills | Status: AC
Start: 2024-04-09 — End: 2024-04-09

## 2024-04-09 MED ORDER — LOSARTAN POTASSIUM 50 MG PO TABS: 50.0000 mg | ORAL_TABLET | Freq: Every day | ORAL | 1 refills | Status: AC

## 2024-04-09 NOTE — Progress Notes (Signed)
 Established Patient Office Visit  Subjective   Patient ID: Hayley Fox, female    DOB: 1956-01-26  Age: 68 y.o. MRN: [id number]  Chief Complaint  Patient presents with   Hypertension    Patient states her blood pressure has been elevated (readings of 200 systolic-90's diastolic) for the past 2 weeks, questioned if this is due to forgetting to place the Rx in the weekly pill box and did not take increased dose Losartan  x1 month    Hypertension   Discussed the use of AI scribe software for clinical note transcription with the patient, who gave verbal consent to proceed.  History of Present Illness   Hayley Fox is a 68 year old female with hypertension who presents with elevated blood pressure readings.  Over the past two weeks, she has had consistently high blood pressure readings, with home measurements reaching up to 173/93 mmHg using a wrist cuff. She discovered she had not been taking her losartan  as it was missing from her pill box and has resumed taking losartan  50 mg daily since March 22, 2024.  She denies symptoms such as shortness of breath or chest pain. Recent lab work from last month showed normal kidney function and an A1c of 6.4%.  Her current medications include metoprolol , spironolactone , and diabetes medications Rybelsus  and Farxiga . She is unsure about the exact names of all her medications but confirms she is taking them as prescribed.       Current Outpatient Medications  Medication Instructions   albuterol  (VENTOLIN  HFA) 108 (90 Base) MCG/ACT inhaler TAKE 2 PUFFS BY MOUTH EVERY 6 HOURS AS NEEDED FOR WHEEZE OR SHORTNESS OF BREATH   Blood Pressure Monitoring (BLOOD PRESSURE KIT) DEVI 1 each, Does not apply,  Once   Continuous Glucose Sensor (FREESTYLE LIBRE 14 DAY SENSOR) MISC 1 each, Does not apply, Every 14 days   dapagliflozin  propanediol (FARXIGA ) 10 mg, Oral, Daily before breakfast   fluticasone  (FLONASE ) 50 MCG/ACT nasal spray USE 1-2 SPRAYS IN BOTH NOSTRILS  AT BEDTIME   losartan  (COZAAR ) 50 mg, Oral, Daily   metFORMIN  (GLUCOPHAGE -XR) 750 mg, Oral, 2 times daily, TAKE 1 TABLET(500 MG) BY MOUTH TWICE DAILY WITH A MEAL   metoprolol  succinate (TOPROL -XL) 50 mg, Oral, Daily   rosuvastatin  (CRESTOR ) 10 mg, Oral, Daily   Rybelsus  14 mg, Oral, Daily   spironolactone  (ALDACTONE ) 25 mg, Oral, Daily    Patient Active Problem List   Diagnosis Date Noted   Vaginal candidiasis 01/12/2022   Fatigue 01/12/2022   Pain of left sacroiliac joint 01/20/2021   Diabetes mellitus type II, non insulin dependent (HCC) 10/09/2015   Hyperlipemia 10/09/2015   Allergic rhinitis 04/18/2013   Essential hypertension 12/03/2008     Review of Systems  All other systems reviewed and are negative.     Objective:     BP 110/80   Pulse 85   Temp 98.7 F (37.1 C) (Oral)   Ht 5' 4 (1.626 m)   Wt 163 lb 6.4 oz (74.1 kg)   LMP 07/29/2011   SpO2 97%   BMI 28.05 kg/m    Physical Exam Vitals reviewed.  Constitutional:      Appearance: Normal appearance. She is normal weight.  Eyes:     Conjunctiva/sclera: Conjunctivae normal.  Cardiovascular:     Rate and Rhythm: Normal rate and regular rhythm.     Heart sounds: Normal heart sounds.  Pulmonary:     Effort: Pulmonary effort is normal.     Breath sounds: Normal breath  sounds. No wheezing.  Neurological:     Mental Status: She is alert.      No results found for any visits on 04/09/24.    The 10-year ASCVD risk score (Arnett DK, et al., 2019) is: 15%    Assessment & Plan:  Essential hypertension -     Losartan  Potassium; Take 1 tablet (50 mg total) by mouth daily.  Dispense: 90 tablet; Refill: 1 -     Blood Pressure Kit; 1 each by Does not apply route once for 1 dose.  Dispense: 1 each; Refill: 0    Assessment and Plan    Hypertension Recent increase in blood pressure readings, possibly due to inaccurate wrist cuff. Current readings range from 140 to 180 mmHg systolic. No symptoms such as  shortness of breath or chest pain. Recent labs show normal kidney function and no proteinuria. Current medication includes losartan  50 mg, metoprolol , and spironolactone . Discussed potential need to increase losartan  to 100 mg if blood pressure remains elevated. Risks of increasing losartan  include potential for hypotension and dizziness. Shared decision to continue current regimen and reassess in two weeks. - Continue losartan  50 mg daily. - Ordered new blood pressure cuff for accurate readings. - Monitor blood pressure at home and report readings via MyChart in two weeks. - Will consider increasing losartan  to 100 mg if blood pressure remains elevated. - Ensure continuation of metoprolol  and spironolactone .  Type 2 diabetes mellitus Recent A1c of 6.4%. Blood sugar levels slightly elevated but well-controlled overall. Current medications include Rybelsus  and Farxiga . - Continue current diabetes medications: Rybelsus  and Farxiga .       No follow-ups on file.    Heron CHRISTELLA Sharper, MD

## 2024-04-09 NOTE — Patient Instructions (Signed)
Send me your blood pressure readings in 2 weeks

## 2024-04-10 NOTE — Progress Notes (Signed)
 Hayley Fox                                          MRN: [id number]   04/10/2024   The VBCI Quality Team Specialist reviewed this patient medical record for the purposes of chart review for care gap closure. The following were reviewed: abstraction for care gap closure-glycemic status assessment.    VBCI Quality Team

## 2024-06-14 ENCOUNTER — Other Ambulatory Visit: Payer: Self-pay | Admitting: Cardiovascular Disease

## 2024-06-14 ENCOUNTER — Telehealth: Payer: Self-pay | Admitting: Cardiovascular Disease

## 2024-06-14 MED ORDER — DAPAGLIFLOZIN PROPANEDIOL 10 MG PO TABS: 10.0000 mg | ORAL_TABLET | Freq: Every day | ORAL | 0 refills | Status: AC

## 2024-06-14 NOTE — Telephone Encounter (Signed)
 RX sent in

## 2024-06-14 NOTE — Telephone Encounter (Signed)
" °*  STAT* If patient is at the pharmacy, call can be transferred to refill team.   1. Which medications need to be refilled? (please list name of each medication and dose if known)  dapagliflozin  propanediol (FARXIGA ) 10 MG TABS tablet   2. Would you like to learn more about the convenience, safety, & potential cost savings by using the [facility]? no   3. Are you open to using the [facility] (Type [facility]. no   4. Which pharmacy/location (including street and city if local pharmacy) is medication to be sent to?  [id number] - [place], [place] - [address]    5. Do they need a 30 day or 90 day supply? 90 day  "

## 2024-06-17 NOTE — Telephone Encounter (Signed)
" ° °  Will send to PA team to see if Jardiance  is preferred over Farxiga . "

## 2024-06-17 NOTE — Telephone Encounter (Signed)
 Pt of Dr. Barbaraann. Please address Pharmacy request.

## 2024-06-18 ENCOUNTER — Telehealth: Payer: Self-pay | Admitting: Pharmacy Technician

## 2024-06-18 ENCOUNTER — Other Ambulatory Visit (HOSPITAL_COMMUNITY): Payer: Self-pay

## 2024-06-18 NOTE — Telephone Encounter (Signed)
 Pharmacy Patient Advocate Encounter   Received notification from Physician's Office that prior authorization for Hayley Fox is required/requested.   Insurance verification completed.   The patient is insured through [employer].   Per test claim: The current 06/18/24 day co-pay is, $0.00- 3 months.  No PA needed at this time. This test claim was processed through [facility]- copay amounts may vary at other pharmacies due to pharmacy/plan contracts, or as the patient moves through the different stages of their insurance plan.

## 2024-06-18 NOTE — Telephone Encounter (Signed)
 Pharmacy Patient Advocate Encounter   Received notification from Physician's Office that prior authorization for Jardiance  is required/requested.   Insurance verification completed.   The patient is insured through [employer].   Per test claim: The current 06/18/24 day co-pay is, $0.00- 3 months.  No PA needed at this time. This test claim was processed through [facility]- copay amounts may vary at other pharmacies due to pharmacy/plan contracts, or as the patient moves through the different stages of their insurance plan.

## 2024-07-08 NOTE — Progress Notes (Unsigned)
 " Cardiology Office Note:  .   Date:  07/12/2024  ID:  Hayley Fox, DOB 09-Mar-1956, MRN [id number] PCP: Ozell Heron HERO, MD  [facility] Providers Cardiologist:  None   History of Present Illness: .    Chief Complaint  Patient presents with   Follow-up         Hayley Fox is a 69 y.o. female with below history who presents for follow-up.   History of Present Illness   Hayley Fox is a 69 year old female with hypertension, PVCs, nonobstructive CAD, systolic heart failure, and diabetes who presents for follow-up and chronic MRI with concerns for LV noncompaction.  She has a history of systolic heart failure with a current ejection fraction of 36%. She is asymptomatic with no chest pain or dyspnea. Her current medication regimen includes carvedilol, spironolactone , and Farxiga . She previously experienced a cough with Entresto , so it is not currently part of the regimen.  Her blood pressure is well-controlled on losartan  and metoprolol  succinate. No chest pain, trouble breathing, or leg swelling.  Her diabetes management is stable with a recent HbA1c of 6.4%. She is currently on Farxiga  10 mg daily, losartan  50 mg, metoprolol  succinate 50 mg, spironolactone  25 mg, and a small dose of Crestor . She also takes Rebelsus and metformin  for her diabetes.  She has a history of PVCs. The recent EKG shows sinus rhythm with an isolated PVC. She is asymptomatic regarding this condition.  She has a history of nonobstructive coronary artery disease. Recent evaluations, including CT scans, have shown no significant blockages.  She mentions occasional arm pain, which she speculates might be arthritis. She remains physically active through her work, which involves being on her feet.           Problem List HTN DM -A1c 6.4 3. HLD -T chol 133, HDL 50, LDL 68, TG 76 4. PVCs -7.4% burden 5. 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 Non-obstructive CAD -<25% stenosis -CAC 5 (61st percentile)     ROS: All other ROS reviewed and negative. Pertinent positives noted in the HPI.     Studies Reviewed: SABRA   EKG Interpretation Date/Time:  Friday July 12 2024 15:21:23 EST Ventricular Rate:  100 PR Interval:  156 QRS Duration:  94 QT Interval:  338 QTC Calculation: 436 R Axis:   -16  Text Interpretation: Sinus rhythm with occasional Premature ventricular complexes Minimal voltage criteria for LVH, may be normal variant ( R in aVL ) ST & T wave abnormality, consider lateral ischemia Confirmed by Barbaraann Kotyk [phone]) on 07/12/2024 3:33:21 PM   CMR 08/11/2023 IMPRESSION: 1. Findings most consistent with noncompaction cardiomyopathy. Prominent LV apical and lateral trabeculations with noncompacted to compacted ratio of 2.6.   2. Mildly dilated left ventricular chamber size with moderately reduced LV systolic function, LVEF 36%. No myocardial edema. Delayed myocardial enhancement in the basal RV insertion points suggests elevated pulmonary pressures.   3. Normal right ventricular chamber size and systolic function, RVEF 52%.   4.  Mild mitral valve regurgitation.  Physical Exam:   VS:  BP 112/72 (BP Location: Left Arm, Patient Position: Sitting, Cuff Size: Normal)   Pulse 100   Ht 5' 4 (1.626 m)   Wt 161 lb (73 kg)   LMP 07/29/2011   SpO2 96%   BMI 27.64 kg/m    Wt Readings from Last 3 Encounters:  07/12/24 161 lb (73 kg)  07/09/24 158 lb (71.7 kg)  04/09/24 163 lb 6.4 oz (74.1 kg)    GEN: Well nourished, well developed in no acute distress NECK: No JVD; No carotid bruits CARDIAC: RRR, no murmurs, rubs, gallops RESPIRATORY:  Clear to auscultation without rales, wheezing or rhonchi  ABDOMEN: Soft, non-tender, non-distended EXTREMITIES:  No edema; No deformity  ASSESSMENT AND PLAN: .   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 Previous MRI suggests dilated cardiomyopathy rather than noncompaction cardiomyopathy. Current medications are well-tolerated except for a cough with Entresto . - Ordered heart ultrasound to reassess heart function. - Continue current medications: metoprolol  succinate 50 mg daily, losartan  50 mg daily, aldactone  25 mg daily, farxiga  10 mg daily.   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  - Continue monitoring with heart ultrasound.  Premature ventricular contractions EKG shows sinus rhythm with an isolated PBC. 7.4% burden on monitor. No symptoms.  - Continue current management. BB as above.   Coronary artery disease without angina HLD No angina or symptoms suggestive of coronary artery disease. Previous CT scans showed no significant heart blockages. - Continue current management. - LDL 68. Continue crestor  10 mg daily. No ASA.  Primary hypertension Blood pressure is well-controlled with current medication regimen. - Continue current antihypertensive medications.                Follow-up: Return in about 6 months (around 01/09/2025).  Signed, Darryle DASEN. Barbaraann, MD, [facility]  [facility]  [address] [place], [place] [address] [phone]  3:39 PM   "

## 2024-07-09 ENCOUNTER — Encounter: Payer: Self-pay | Admitting: Family Medicine

## 2024-07-09 ENCOUNTER — Ambulatory Visit: Admitting: Family Medicine

## 2024-07-09 VITALS — BP 120/80 | HR 85 | Temp 98.6°F | Ht 64.0 in | Wt 158.0 lb

## 2024-07-09 DIAGNOSIS — H6593 Unspecified nonsuppurative otitis media, bilateral: Secondary | ICD-10-CM | POA: Diagnosis not present

## 2024-07-09 MED ORDER — PREDNISONE 20 MG PO TABS: 40.0000 mg | ORAL_TABLET | Freq: Every day | ORAL | 0 refills | Status: AC

## 2024-07-09 NOTE — Progress Notes (Signed)
 "  Established Patient Office Visit  Subjective   Patient ID: Hayley Fox, female    DOB: [birth year]  Age: 69 y.o. MRN: [id number]  Chief Complaint  Patient presents with   Ear Fullness    Patient complains of bilateral ear congestion x1.5 week, questioned if related to recent URI 2 weeks ago which consisted of a productive cough with red-blood tinged brown sputum, now resolved    Ear Fullness    Discussed the use of AI scribe software for clinical note transcription with the patient, who gave verbal consent to proceed.  History of Present Illness   Hayley Fox is a 69 year old female who presents with ear fullness following an upper respiratory infection.  URI symptoms began about two weeks ago after exposure to a client who had been on a cruise. Last week she had severe cough, mucus production, body aches, fever, chills, and marked fatigue, treated at home with rest and over-the-counter medications without flu or COVID testing. Systemic symptoms have resolved. She still has a residual cough.  She now has bilateral ear fullness described as a clogged or water-in-the-ears sensation with decreased hearing. 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 She denies ear pain, current fever, or chills.       Current Outpatient Medications  Medication Instructions   albuterol  (VENTOLIN  HFA) 108 (90 Base) MCG/ACT inhaler TAKE 2 PUFFS BY MOUTH EVERY 6 HOURS AS NEEDED FOR WHEEZE OR SHORTNESS OF BREATH   Continuous Glucose Sensor (FREESTYLE LIBRE 14 DAY SENSOR) MISC 1 each, Does not apply, Every 14 days   dapagliflozin  propanediol (FARXIGA ) 10 mg, Oral, Daily before breakfast   fluticasone  (FLONASE ) 50 MCG/ACT nasal spray USE 1-2 SPRAYS IN BOTH NOSTRILS AT BEDTIME   losartan  (COZAAR ) 50 mg, Oral, Daily   metFORMIN  (GLUCOPHAGE -XR) 750 mg, Oral, 2 times daily, TAKE 1 TABLET(500 MG) BY MOUTH TWICE DAILY WITH A MEAL   metoprolol  succinate (TOPROL -XL) 50 mg, Oral,  Daily   predniSONE  (DELTASONE ) 40 mg, Oral, Daily with breakfast   rosuvastatin  (CRESTOR ) 10 mg, Oral, Daily   Rybelsus  14 mg, Oral, Daily   spironolactone  (ALDACTONE ) 25 mg, Oral, Daily    Patient Active Problem List   Diagnosis Date Noted   Vaginal candidiasis 01/12/2022   Fatigue 01/12/2022   Pain of left sacroiliac joint 01/20/2021   Diabetes mellitus type II, non insulin dependent (HCC) 10/09/2015   Hyperlipemia 10/09/2015   Allergic rhinitis 04/18/2013   Essential hypertension 12/03/2008     Review of Systems  All other systems reviewed and are negative.     Objective:     BP 120/80   Pulse 85   Temp 98.6 F (37 C) (Oral)   Ht 5' 4 (1.626 m)   Wt 158 lb (71.7 kg)   LMP 07/29/2011   SpO2 98%   BMI 27.12 kg/m    Physical Exam HENT:     Right Ear: Ear canal normal. A middle ear effusion is present.     Left Ear: Ear canal normal. A middle ear effusion is present.     Nose: Congestion present.     Right Sinus: No maxillary sinus tenderness.     Left Sinus: No maxillary sinus tenderness.     Mouth/Throat:     Mouth: Mucous membranes are moist.     Pharynx: No oropharyngeal exudate or posterior oropharyngeal erythema.      No results found for any visits on 07/09/24.  The 10-year ASCVD risk score (Arnett DK, et al., 2019) is: 17.8%    Assessment & Plan:  Fluid level behind tympanic membrane of both ears -     predniSONE ; Take 2 tablets (40 mg total) by mouth daily with breakfast for 4 days.  Dispense: 8 tablet; Refill: 0   Assessment and Plan    Bilateral middle ear effusion Likely secondary to recent acute viral upper respiratory infection. Eardrums are not infected, but fluid is present behind her. Symptoms include a sensation of fullness and difficulty hearing, without pain. The condition is expected to resolve spontaneously as the fluid drains. - Prescribed prednisone , two tablets once a day for four days, to reduce inflammation and facilitate  ear popping. - Instructed to perform ear popping exercises multiple times a day to help drain fluid. - If symptoms persist, will refer to an ear, nose, and throat specialist.  Acute viral upper respiratory infection Recent acute viral upper respiratory infection with symptoms of cough, phlegm production, body aches, fever, and chills. Symptoms have improved, and she is no longer contagious. Lungs are clear with no wheezing.        No follow-ups on file.    Heron CHRISTELLA Sharper, MD "

## 2024-07-12 ENCOUNTER — Ambulatory Visit: Attending: Cardiovascular Disease | Admitting: Cardiovascular Disease

## 2024-07-12 ENCOUNTER — Encounter: Payer: Self-pay | Admitting: Cardiovascular Disease

## 2024-07-12 VITALS — BP 112/72 | HR 100 | Ht 64.0 in | Wt 161.0 lb

## 2024-07-12 DIAGNOSIS — E782 Mixed hyperlipidemia: Secondary | ICD-10-CM

## 2024-07-12 DIAGNOSIS — I428 Other cardiomyopathies: Secondary | ICD-10-CM

## 2024-07-12 DIAGNOSIS — I10 Essential (primary) hypertension: Secondary | ICD-10-CM

## 2024-07-12 DIAGNOSIS — R002 Palpitations: Secondary | ICD-10-CM

## 2024-07-12 DIAGNOSIS — I2510 Atherosclerotic heart disease of native coronary artery without angina pectoris: Secondary | ICD-10-CM

## 2024-07-12 DIAGNOSIS — I493 Ventricular premature depolarization: Secondary | ICD-10-CM | POA: Diagnosis not present

## 2024-07-12 DIAGNOSIS — I420 Dilated cardiomyopathy: Secondary | ICD-10-CM

## 2024-07-12 NOTE — Patient Instructions (Signed)
 Medication Instructions:  Your physician recommends that you continue on your current medications as directed. Please refer to the Current Medication list given to you today.  *If you need a refill on your cardiac medications before your next appointment, please call your pharmacy*  Testing/Procedures: Your physician has requested that you have an echocardiogram. Echocardiography is a painless test that uses sound waves to create images of your heart. It provides your doctor with information about the size and shape of your heart and how well your hearts chambers and valves are working. This procedure takes approximately one hour. There are no restrictions for this procedure. Please do NOT wear cologne, perfume, aftershave, or lotions (deodorant is allowed). Please arrive 15 minutes prior to your appointment time.  Please note: We ask at that you not bring children with you during ultrasound (echo/ vascular) testing. Due to room size and safety concerns, children are not allowed in the ultrasound rooms during exams. Our front office staff cannot provide observation of children in our lobby area while testing is being conducted. An adult accompanying a patient to their appointment will only be allowed in the ultrasound room at the discretion of the ultrasound technician under special circumstances. We apologize for any inconvenience.   Follow-Up: At [facility], you and your health needs are our priority.  As part of our continuing mission to provide you with exceptional heart care, our providers are all part of one team.  This team includes your primary Cardiologist (physician) and Advanced Practice Providers or APPs (Physician Assistants and Nurse Practitioners) who all work together to provide you with the care you need, when you need it.  Your next appointment:   6 month(s)  Provider:   Lum Louis, NP, Aline Door, PA-C, Rollo Louder, PA-C, Damien Braver, NP, or Katlyn  West, NP         Then, [employer]. Barbaraann, MD, [facility] will plan to see you again in 12 month(s).     We recommend signing up for the patient portal called MyChart.  Sign up information is provided on this After Visit Summary.  MyChart is used to connect with patients for Virtual Visits (Telemedicine).  Patients are able to view lab/test results, encounter notes, upcoming appointments, etc.  Non-urgent messages can be sent to your provider as well.   To learn more about what you can do with MyChart, go to [email or web address].   Other Instructions

## 2024-08-15 ENCOUNTER — Ambulatory Visit (HOSPITAL_COMMUNITY)

## 2024-08-26 ENCOUNTER — Ambulatory Visit: Admitting: Family Medicine
